# Patient Record
Sex: Female | Born: 1945
Health system: Southern US, Community
[De-identification: ages and names within clinical notes are randomized; demographics above are authoritative.]

## PROBLEM LIST (undated history)

## (undated) DIAGNOSIS — F329 Major depressive disorder, single episode, unspecified: Secondary | ICD-10-CM

## (undated) DIAGNOSIS — K5790 Diverticulosis of intestine, part unspecified, without perforation or abscess without bleeding: Secondary | ICD-10-CM

## (undated) DIAGNOSIS — I219 Acute myocardial infarction, unspecified: Secondary | ICD-10-CM

## (undated) DIAGNOSIS — M1990 Unspecified osteoarthritis, unspecified site: Secondary | ICD-10-CM

## (undated) DIAGNOSIS — M8580 Other specified disorders of bone density and structure, unspecified site: Secondary | ICD-10-CM

## (undated) DIAGNOSIS — C50919 Malignant neoplasm of unspecified site of unspecified female breast: Secondary | ICD-10-CM

## (undated) DIAGNOSIS — M4800 Spinal stenosis, site unspecified: Secondary | ICD-10-CM

## (undated) DIAGNOSIS — R32 Unspecified urinary incontinence: Secondary | ICD-10-CM

## (undated) DIAGNOSIS — E669 Obesity, unspecified: Secondary | ICD-10-CM

## (undated) DIAGNOSIS — I5189 Other ill-defined heart diseases: Secondary | ICD-10-CM

## (undated) DIAGNOSIS — S2232XA Fracture of one rib, left side, initial encounter for closed fracture: Secondary | ICD-10-CM

## (undated) DIAGNOSIS — I10 Essential (primary) hypertension: Secondary | ICD-10-CM

## (undated) DIAGNOSIS — Z923 Personal history of irradiation: Secondary | ICD-10-CM

## (undated) DIAGNOSIS — N183 Chronic kidney disease, stage 3 unspecified: Secondary | ICD-10-CM

## (undated) DIAGNOSIS — D509 Iron deficiency anemia, unspecified: Secondary | ICD-10-CM

## (undated) DIAGNOSIS — K219 Gastro-esophageal reflux disease without esophagitis: Secondary | ICD-10-CM

## (undated) DIAGNOSIS — F32A Depression, unspecified: Secondary | ICD-10-CM

## (undated) DIAGNOSIS — I2510 Atherosclerotic heart disease of native coronary artery without angina pectoris: Secondary | ICD-10-CM

## (undated) DIAGNOSIS — R011 Cardiac murmur, unspecified: Secondary | ICD-10-CM

## (undated) DIAGNOSIS — E785 Hyperlipidemia, unspecified: Secondary | ICD-10-CM

## (undated) DIAGNOSIS — M5416 Radiculopathy, lumbar region: Secondary | ICD-10-CM

## (undated) DIAGNOSIS — K635 Polyp of colon: Secondary | ICD-10-CM

## (undated) DIAGNOSIS — R079 Chest pain, unspecified: Secondary | ICD-10-CM

## (undated) DIAGNOSIS — K7689 Other specified diseases of liver: Secondary | ICD-10-CM

## (undated) DIAGNOSIS — Z9221 Personal history of antineoplastic chemotherapy: Secondary | ICD-10-CM

## (undated) HISTORY — DX: Major depressive disorder, single episode, unspecified: F32.9

## (undated) HISTORY — DX: Iron deficiency anemia, unspecified: D50.9

## (undated) HISTORY — DX: Malignant neoplasm of unspecified site of unspecified female breast: C50.919

## (undated) HISTORY — DX: Other ill-defined heart diseases: I51.89

## (undated) HISTORY — DX: Hyperlipidemia, unspecified: E78.5

## (undated) HISTORY — DX: Obesity, unspecified: E66.9

## (undated) HISTORY — DX: Chest pain, unspecified: R07.9

## (undated) HISTORY — DX: Polyp of colon: K63.5

## (undated) HISTORY — PX: CHOLECYSTECTOMY: SHX55

## (undated) HISTORY — DX: Unspecified osteoarthritis, unspecified site: M19.90

## (undated) HISTORY — DX: Other specified diseases of liver: K76.89

## (undated) HISTORY — DX: Acute myocardial infarction, unspecified: I21.9

## (undated) HISTORY — DX: Diverticulosis of intestine, part unspecified, without perforation or abscess without bleeding: K57.90

## (undated) HISTORY — DX: Essential (primary) hypertension: I10

## (undated) HISTORY — DX: Spinal stenosis, site unspecified: M48.00

## (undated) HISTORY — DX: Fracture of one rib, left side, initial encounter for closed fracture: S22.32XA

## (undated) HISTORY — DX: Radiculopathy, lumbar region: M54.16

## (undated) HISTORY — DX: Chronic kidney disease, stage 3 unspecified: N18.30

## (undated) HISTORY — DX: Cardiac murmur, unspecified: R01.1

## (undated) HISTORY — PX: APPENDECTOMY: SHX54

## (undated) HISTORY — PX: TONSILLECTOMY AND ADENOIDECTOMY: SUR1326

## (undated) HISTORY — DX: Depression, unspecified: F32.A

## (undated) HISTORY — DX: Unspecified urinary incontinence: R32

## (undated) HISTORY — DX: Other specified disorders of bone density and structure, unspecified site: M85.80

---

## 1978-06-02 HISTORY — PX: ABDOMINAL ADHESION SURGERY: SHX90

## 1986-06-02 HISTORY — PX: TUBAL LIGATION: SHX77

## 1992-06-02 HISTORY — PX: OTHER SURGICAL HISTORY: SHX169

## 1997-09-15 ENCOUNTER — Ambulatory Visit (HOSPITAL_COMMUNITY): Admission: RE | Admit: 1997-09-15 | Discharge: 1997-09-16 | Payer: Self-pay | Admitting: Surgery

## 1997-10-11 ENCOUNTER — Other Ambulatory Visit: Admission: RE | Admit: 1997-10-11 | Discharge: 1997-10-11 | Payer: Self-pay | Admitting: Gynecology

## 1998-12-25 ENCOUNTER — Other Ambulatory Visit: Admission: RE | Admit: 1998-12-25 | Discharge: 1998-12-25 | Payer: Self-pay | Admitting: Gynecology

## 1999-11-11 ENCOUNTER — Encounter: Admission: RE | Admit: 1999-11-11 | Discharge: 1999-11-11 | Payer: Self-pay | Admitting: Gynecology

## 1999-11-11 ENCOUNTER — Encounter: Payer: Self-pay | Admitting: Gynecology

## 1999-11-14 ENCOUNTER — Encounter: Payer: Self-pay | Admitting: *Deleted

## 1999-11-14 ENCOUNTER — Encounter: Admission: RE | Admit: 1999-11-14 | Discharge: 1999-11-14 | Payer: Self-pay | Admitting: *Deleted

## 2000-04-17 ENCOUNTER — Encounter: Payer: Self-pay | Admitting: *Deleted

## 2000-04-17 ENCOUNTER — Encounter: Admission: RE | Admit: 2000-04-17 | Discharge: 2000-04-17 | Payer: Self-pay | Admitting: *Deleted

## 2000-11-23 ENCOUNTER — Encounter: Admission: RE | Admit: 2000-11-23 | Discharge: 2000-11-23 | Payer: Self-pay | Admitting: *Deleted

## 2000-11-23 ENCOUNTER — Encounter: Payer: Self-pay | Admitting: *Deleted

## 2001-05-28 ENCOUNTER — Encounter: Payer: Self-pay | Admitting: *Deleted

## 2001-05-28 ENCOUNTER — Encounter: Admission: RE | Admit: 2001-05-28 | Discharge: 2001-05-28 | Payer: Self-pay | Admitting: *Deleted

## 2001-12-13 ENCOUNTER — Encounter: Payer: Self-pay | Admitting: *Deleted

## 2001-12-13 ENCOUNTER — Encounter: Admission: RE | Admit: 2001-12-13 | Discharge: 2001-12-13 | Payer: Self-pay | Admitting: *Deleted

## 2002-11-28 ENCOUNTER — Encounter: Admission: RE | Admit: 2002-11-28 | Discharge: 2002-11-28 | Payer: Self-pay

## 2003-01-31 ENCOUNTER — Encounter: Admission: RE | Admit: 2003-01-31 | Discharge: 2003-01-31 | Payer: Self-pay

## 2004-04-17 ENCOUNTER — Other Ambulatory Visit: Admission: RE | Admit: 2004-04-17 | Discharge: 2004-04-17 | Payer: Self-pay | Admitting: Internal Medicine

## 2004-05-28 ENCOUNTER — Encounter: Admission: RE | Admit: 2004-05-28 | Discharge: 2004-05-28 | Payer: Self-pay | Admitting: Internal Medicine

## 2004-06-13 ENCOUNTER — Encounter: Admission: RE | Admit: 2004-06-13 | Discharge: 2004-06-13 | Payer: Self-pay | Admitting: Internal Medicine

## 2005-07-09 ENCOUNTER — Encounter: Admission: RE | Admit: 2005-07-09 | Discharge: 2005-07-09 | Payer: Self-pay | Admitting: Internal Medicine

## 2006-04-21 ENCOUNTER — Other Ambulatory Visit: Admission: RE | Admit: 2006-04-21 | Discharge: 2006-04-21 | Payer: Self-pay | Admitting: Internal Medicine

## 2006-07-13 ENCOUNTER — Encounter: Admission: RE | Admit: 2006-07-13 | Discharge: 2006-07-13 | Payer: Self-pay | Admitting: Internal Medicine

## 2007-07-19 ENCOUNTER — Encounter: Admission: RE | Admit: 2007-07-19 | Discharge: 2007-07-19 | Payer: Self-pay | Admitting: Internal Medicine

## 2007-10-07 ENCOUNTER — Encounter: Admission: RE | Admit: 2007-10-07 | Discharge: 2007-10-07 | Payer: Self-pay | Admitting: Internal Medicine

## 2008-04-25 ENCOUNTER — Other Ambulatory Visit: Admission: RE | Admit: 2008-04-25 | Discharge: 2008-04-25 | Payer: Self-pay | Admitting: Gastroenterology

## 2008-09-11 ENCOUNTER — Encounter: Admission: RE | Admit: 2008-09-11 | Discharge: 2008-09-11 | Payer: Self-pay | Admitting: Gynecology

## 2009-09-14 ENCOUNTER — Encounter: Admission: RE | Admit: 2009-09-14 | Discharge: 2009-09-14 | Payer: Self-pay | Admitting: Internal Medicine

## 2009-09-20 ENCOUNTER — Encounter: Admission: RE | Admit: 2009-09-20 | Discharge: 2009-09-20 | Payer: Self-pay | Admitting: Internal Medicine

## 2010-01-25 ENCOUNTER — Encounter: Admission: RE | Admit: 2010-01-25 | Discharge: 2010-01-25 | Payer: Self-pay | Admitting: Internal Medicine

## 2010-03-04 ENCOUNTER — Encounter: Admission: RE | Admit: 2010-03-04 | Discharge: 2010-03-04 | Payer: Self-pay | Admitting: Internal Medicine

## 2010-06-22 ENCOUNTER — Encounter: Payer: Self-pay | Admitting: Internal Medicine

## 2010-09-05 ENCOUNTER — Other Ambulatory Visit: Payer: Self-pay | Admitting: Obstetrics and Gynecology

## 2010-09-05 ENCOUNTER — Other Ambulatory Visit (HOSPITAL_COMMUNITY)
Admission: RE | Admit: 2010-09-05 | Discharge: 2010-09-05 | Disposition: A | Discharge status: Home / Self Care | Payer: PRIVATE HEALTH INSURANCE | Source: Ambulatory Visit | Attending: Obstetrics and Gynecology | Admitting: Obstetrics and Gynecology

## 2010-09-05 DIAGNOSIS — Z124 Encounter for screening for malignant neoplasm of cervix: Secondary | ICD-10-CM | POA: Insufficient documentation

## 2010-09-24 ENCOUNTER — Encounter (HOSPITAL_COMMUNITY)
Admission: RE | Admit: 2010-09-24 | Discharge: 2010-09-24 | Disposition: A | Discharge status: Home / Self Care | Payer: PRIVATE HEALTH INSURANCE | Source: Ambulatory Visit | Attending: Obstetrics and Gynecology | Admitting: Obstetrics and Gynecology

## 2010-09-24 LAB — CBC
HCT: 39.1 % (ref 36.0–46.0)
Hemoglobin: 12.7 g/dL (ref 12.0–15.0)
MCV: 90.1 fL (ref 78.0–100.0)
RBC: 4.34 MIL/uL (ref 3.87–5.11)
RDW: 13.4 % (ref 11.5–15.5)
WBC: 5.5 10*3/uL (ref 4.0–10.5)

## 2010-09-24 LAB — BASIC METABOLIC PANEL
BUN: 25 mg/dL — ABNORMAL HIGH (ref 6–23)
CO2: 26 mEq/L (ref 19–32)
Chloride: 105 mEq/L (ref 96–112)
GFR calc non Af Amer: 49 mL/min — ABNORMAL LOW (ref >60)
Glucose, Bld: 94 mg/dL (ref 70–99)
Potassium: 4 mEq/L (ref 3.5–5.1)
Sodium: 139 mEq/L (ref 135–145)

## 2010-10-01 ENCOUNTER — Ambulatory Visit (HOSPITAL_COMMUNITY)
Admission: RE | Admit: 2010-10-01 | Discharge: 2010-10-01 | Disposition: A | Discharge status: Home / Self Care | Payer: PRIVATE HEALTH INSURANCE | Source: Ambulatory Visit | Attending: Obstetrics and Gynecology | Admitting: Obstetrics and Gynecology

## 2010-10-01 ENCOUNTER — Other Ambulatory Visit: Payer: Self-pay | Admitting: Internal Medicine

## 2010-10-01 DIAGNOSIS — N393 Stress incontinence (female) (male): Secondary | ICD-10-CM | POA: Insufficient documentation

## 2010-10-01 DIAGNOSIS — Z01812 Encounter for preprocedural laboratory examination: Secondary | ICD-10-CM | POA: Insufficient documentation

## 2010-10-01 DIAGNOSIS — Z1231 Encounter for screening mammogram for malignant neoplasm of breast: Secondary | ICD-10-CM

## 2010-10-01 DIAGNOSIS — Z01818 Encounter for other preprocedural examination: Secondary | ICD-10-CM | POA: Insufficient documentation

## 2010-10-01 HISTORY — PX: BLADDER SUSPENSION: SHX72

## 2010-10-29 NOTE — Op Note (Signed)
NAME:  [name], [name]               ACCOUNT NO.:  [id number]  MEDICAL RECORD NO.:  [id number]           PATIENT TYPE:  O  LOCATION:  [facility]                          [facility]  PHYSICIAN:  Patsy Baltimore, MD     DATE OF BIRTH:  1945-10-06  DATE OF PROCEDURE:  10/01/2010 DATE OF DISCHARGE:  10/01/2010                              OPERATIVE REPORT   PREOPERATIVE DIAGNOSIS:  Stress incontinence.  POSTOPERATIVE DIAGNOSIS:  Stress incontinence.  PROCEDURE PERFORMED:  Monarc midurethral sling placement, cystoscopy.  SURGEON:  Patsy Baltimore, MD  ANESTHESIA:  General.  ESTIMATED BLOOD LOSS:  Minimal.  The patient was brought to the operating room with IV fluids running. She had been diagnosed with stress urinary incontinence.  Informed consent had been obtained for the Monarc midurethral sling placement. She declined the alternatives and wished to proceed.  While in the operating room, she was put under general anesthesia, given antibiotics and had TEDs and SCDs for prophylaxis.  Her legs were lifted up to the dorsal lithotomy position. She was prepped and draped in the usual sterile fashion.  A time-out was called and we began the procedure.  A weighted speculum was inserted into the vagina.  A Foley catheter was inserted into the bladder.  An Allis clamp was placed approximately 1 to 1.5 cm below the urethral meatus and a midline incision was made sharply in the vaginal epithelium extending about 2-3 cm cephalad.  A solution of 20 units of vasopressin and 40 mL of saline was injected through the course of the vaginal epithelium extending towards the pubic rami.  The Metzenbaum scissors was then used to use for dissection to create bilateral submucosal tunnel beneath the vaginal epithelium on either side towards the pubic rami.  These tunnels were extended up to and behind the pubic rami.  On the outside, 2 skin incisions were made at the level of the clitoris in the thigh crease  at a point 4-6 cm lateral to the clitoris.  This corresponded to the insertion of the adductor longus muscle.  Starting on the left side, the TOT needle was grasped and the tip was placed in one of the thigh incisions.  The tip was then directed cephalad until the obturator membrane was perforated and a popping sensation was felt.  A finger was then placed in the ipsilateral vaginal tunnel and positioned up to and behind the pubic ramus.  Using the curve of the needle, the needle was directed to the tip of my finger and passed into the vagina under direct guidance.  The mesh was then attached to the end of the needle and the needle was withdrawn through the thigh incision.  The same was repeated on the right side making sure that the midline of the mesh was maintained.  A hemostat was placed and opened between the urethra and the mesh to act as a spacer and create distance between the mesh and the urethra.  This was done to avoid excessive elevation of the urethra and lower the risk for retention.  Plastic coverings of the mesh on both sides were then removed through  the thigh incision while holding appropriate tension in the midline.  The mesh was then trimmed at the thigh incision.  The vaginal incision was closed in simple interrupted fashion using 2-0 Vicryl.  The thigh incisions was closed with Dermabond.  At this point cystoscopy was done to inspect the inside of the urethra and bladder. After thorough inspection, there was no evidence of mesh or foreign body or bleeding in the bladder. The cystoscope was then removed.  The Foley was replaced.  We had excellent hemostasis in the vagina.  All the instruments were then removed.  The sponge, lap, needle counts were correct x2.  The patient tolerated the procedure well.  She was transferred to the PACU in stable condition.          ______________________________ Patsy Baltimore, MD     CO/MEDQ  D:  10/10/2010  T:  10/10/2010   Job:  [id number]  Electronically Signed by Patsy Baltimore MD on 10/29/2010 04:41:29 PM

## 2010-11-04 ENCOUNTER — Ambulatory Visit
Admission: RE | Admit: 2010-11-04 | Discharge: 2010-11-04 | Disposition: A | Discharge status: Home / Self Care | Payer: PRIVATE HEALTH INSURANCE | Source: Ambulatory Visit | Attending: Internal Medicine | Admitting: Internal Medicine

## 2010-11-04 DIAGNOSIS — Z1231 Encounter for screening mammogram for malignant neoplasm of breast: Secondary | ICD-10-CM

## 2011-05-08 ENCOUNTER — Encounter: Payer: Self-pay | Admitting: Internal Medicine

## 2011-05-12 ENCOUNTER — Encounter: Payer: Self-pay | Admitting: Internal Medicine

## 2011-06-03 DIAGNOSIS — C50919 Malignant neoplasm of unspecified site of unspecified female breast: Secondary | ICD-10-CM

## 2011-06-03 HISTORY — DX: Malignant neoplasm of unspecified site of unspecified female breast: C50.919

## 2011-06-10 ENCOUNTER — Ambulatory Visit (AMBULATORY_SURGERY_CENTER): Payer: BC Managed Care – PPO | Admitting: *Deleted

## 2011-06-10 ENCOUNTER — Encounter: Payer: Self-pay | Admitting: Internal Medicine

## 2011-06-10 VITALS — Ht 62.0 in | Wt 189.0 lb

## 2011-06-10 DIAGNOSIS — Z1211 Encounter for screening for malignant neoplasm of colon: Secondary | ICD-10-CM

## 2011-06-10 MED ORDER — PEG-KCL-NACL-NASULF-NA ASC-C 100 G PO SOLR: ORAL | Status: DC

## 2011-06-24 ENCOUNTER — Ambulatory Visit (AMBULATORY_SURGERY_CENTER): Payer: BC Managed Care – PPO | Admitting: Internal Medicine

## 2011-06-24 ENCOUNTER — Encounter: Payer: Self-pay | Admitting: Internal Medicine

## 2011-06-24 DIAGNOSIS — D129 Benign neoplasm of anus and anal canal: Secondary | ICD-10-CM

## 2011-06-24 DIAGNOSIS — D126 Benign neoplasm of colon, unspecified: Secondary | ICD-10-CM

## 2011-06-24 DIAGNOSIS — D128 Benign neoplasm of rectum: Secondary | ICD-10-CM

## 2011-06-24 DIAGNOSIS — Z800 Family history of malignant neoplasm of digestive organs: Secondary | ICD-10-CM

## 2011-06-24 DIAGNOSIS — Z1211 Encounter for screening for malignant neoplasm of colon: Secondary | ICD-10-CM

## 2011-06-24 MED ORDER — SODIUM CHLORIDE 0.9 % IV SOLN: 500.0000 mL | INTRAVENOUS | Status: DC

## 2011-06-24 NOTE — Patient Instructions (Signed)
Metamucil 1 tsp by mouth daily

## 2011-06-24 NOTE — Op Note (Signed)
[facility] [address] [place], [place]  [address]  COLONOSCOPY PROCEDURE REPORT  PATIENT:  Kelli, Thomas  MR#:  [id number] BIRTHDATE:  [birth year], 65 yrs. old  GENDER:  female ENDOSCOPIST:  Hedwig Morton. Juanda Chance, MD REF. BY:  [name], M.D. PROCEDURE DATE:  06/24/2011 PROCEDURE:  Colonoscopy with biopsy ASA CLASS:  Class II INDICATIONS:  family history of colon cancer mother with colon cancer, prior colon 1996,2000, MEDICATIONS:   These medications were titrated to patient response per physician's verbal order, Versed 10 mg, Fentanyl 75 mcg  DESCRIPTION OF PROCEDURE:   After the risks and benefits and of the procedure were explained, informed consent was obtained. Digital rectal exam was performed and revealed no rectal masses. The LB CF-[id number] endoscope was introduced through the anus and advanced to the cecum, which was identified by both the appendix and ileocecal valve.  The quality of the prep was good, using MoviPrep.  The instrument was then slowly withdrawn as the colon was fully examined. <<PROCEDUREIMAGES>>  FINDINGS:  A diminutive polyp was found in the rectum. 3 mm flat polyp The polyp was removed using cold biopsy forceps (see image9 and image10).  Moderate diverticulosis was found in the sigmoid colon (see image2, image1, and image7).  This was otherwise a normal examination of the colon (see image8, image6, image5, image4, and image3).   Retroflexed views in the rectum revealed no abnormalities.    The scope was then withdrawn from the patient and the procedure completed.  COMPLICATIONS:  None ENDOSCOPIC IMPRESSION: 1) Diminutive polyp in the rectum 2) Moderate diverticulosis in the sigmoid colon 3) Otherwise normal examination RECOMMENDATIONS: 1) Await pathology results 2) High fiber diet. Metamucil 1 tsp po qd  REPEAT EXAM:  In 5 year(s) for.  ______________________________ Hedwig Morton. Juanda Chance, MD  CC:  n. eSIGNED:   Hedwig Morton.  [name] at 06/24/2011 09:22 AM  Lina Sayre, [phone]

## 2011-06-24 NOTE — Progress Notes (Signed)
Patient did not experience any of the following events: a burn prior to discharge; a fall within the facility; wrong site/side/patient/procedure/implant event; or a hospital transfer or hospital admission upon discharge from the facility. (G8907) Patient did not have preoperative order for IV antibiotic SSI prophylaxis. (G8918)  

## 2011-06-25 ENCOUNTER — Telehealth: Payer: Self-pay | Admitting: *Deleted

## 2011-06-25 NOTE — Telephone Encounter (Signed)

## 2011-06-27 NOTE — Progress Notes (Signed)
Addended by: Maple Hudson on: 06/27/2011 02:47 PM   Modules accepted: Level of Service

## 2011-07-01 ENCOUNTER — Encounter: Payer: Self-pay | Admitting: Internal Medicine

## 2011-07-02 ENCOUNTER — Encounter: Payer: Self-pay | Admitting: *Deleted

## 2011-09-29 ENCOUNTER — Other Ambulatory Visit: Payer: Self-pay | Admitting: Internal Medicine

## 2011-09-29 DIAGNOSIS — Z1231 Encounter for screening mammogram for malignant neoplasm of breast: Secondary | ICD-10-CM

## 2011-11-05 ENCOUNTER — Ambulatory Visit
Admission: RE | Admit: 2011-11-05 | Discharge: 2011-11-05 | Disposition: A | Discharge status: Home / Self Care | Payer: BC Managed Care – PPO | Source: Ambulatory Visit | Attending: Internal Medicine | Admitting: Internal Medicine

## 2011-11-05 DIAGNOSIS — Z1231 Encounter for screening mammogram for malignant neoplasm of breast: Secondary | ICD-10-CM

## 2012-04-27 ENCOUNTER — Other Ambulatory Visit: Payer: Self-pay | Admitting: Internal Medicine

## 2012-04-27 DIAGNOSIS — N6322 Unspecified lump in the left breast, upper inner quadrant: Secondary | ICD-10-CM

## 2012-05-05 ENCOUNTER — Ambulatory Visit
Admission: RE | Admit: 2012-05-05 | Discharge: 2012-05-05 | Disposition: A | Discharge status: Home / Self Care | Payer: BC Managed Care – PPO | Source: Ambulatory Visit | Attending: Internal Medicine | Admitting: Internal Medicine

## 2012-05-05 ENCOUNTER — Other Ambulatory Visit: Payer: Self-pay | Admitting: Internal Medicine

## 2012-05-05 DIAGNOSIS — N6322 Unspecified lump in the left breast, upper inner quadrant: Secondary | ICD-10-CM

## 2012-05-14 ENCOUNTER — Other Ambulatory Visit: Payer: Self-pay | Admitting: Internal Medicine

## 2012-05-14 ENCOUNTER — Ambulatory Visit
Admission: RE | Admit: 2012-05-14 | Discharge: 2012-05-14 | Disposition: A | Discharge status: Home / Self Care | Payer: BC Managed Care – PPO | Source: Ambulatory Visit | Attending: Internal Medicine | Admitting: Internal Medicine

## 2012-05-14 DIAGNOSIS — N6322 Unspecified lump in the left breast, upper inner quadrant: Secondary | ICD-10-CM

## 2012-05-17 ENCOUNTER — Ambulatory Visit
Admission: RE | Admit: 2012-05-17 | Discharge: 2012-05-17 | Disposition: A | Discharge status: Home / Self Care | Payer: BC Managed Care – PPO | Source: Ambulatory Visit | Attending: Internal Medicine | Admitting: Internal Medicine

## 2012-05-17 ENCOUNTER — Other Ambulatory Visit: Payer: Self-pay | Admitting: Internal Medicine

## 2012-05-17 DIAGNOSIS — N6489 Other specified disorders of breast: Secondary | ICD-10-CM

## 2012-05-17 DIAGNOSIS — C50912 Malignant neoplasm of unspecified site of left female breast: Secondary | ICD-10-CM

## 2012-05-17 DIAGNOSIS — N6322 Unspecified lump in the left breast, upper inner quadrant: Secondary | ICD-10-CM

## 2012-05-18 ENCOUNTER — Encounter (INDEPENDENT_AMBULATORY_CARE_PROVIDER_SITE_OTHER): Payer: Self-pay | Admitting: General Surgery

## 2012-05-18 ENCOUNTER — Ambulatory Visit (INDEPENDENT_AMBULATORY_CARE_PROVIDER_SITE_OTHER): Payer: BC Managed Care – PPO | Admitting: General Surgery

## 2012-05-18 VITALS — BP 122/76 | HR 68 | Temp 98.2°F | Resp 14 | Ht 62.0 in | Wt 159.8 lb

## 2012-05-18 DIAGNOSIS — C50119 Malignant neoplasm of central portion of unspecified female breast: Secondary | ICD-10-CM

## 2012-05-19 NOTE — Progress Notes (Signed)
Patient ID: [name], female   DOB: 07/02/1945, 66 y.o.   MRN: [id number]  Chief Complaint  Patient presents with  . Breast Cancer    new pt- eval lt breast cancer    HPI [name] is a 66 y.o. female.  Referred by Dr Hu/Dr. Griffin HPI 66 yof who recently felt a left breast mass near the areola.  She had this evaluated by mmg/us and this is about a 1 cm mass.  She has an mr pending as of now.  A biopsy was performed that showed DCIS/IDC and her prognostic profile is still pending.  She reports no other changes or complaints with either breast.  She has a family history of breast cancer in her sister in her 50s.  She does not know if her sister was evaluated with genetic testing.  She comes in today to discuss her options for care.  Past Medical History  Diagnosis Date  . Arthritis   . Hyperlipidemia   . Hypertension   . Depression   . Heart murmur     Past Surgical History  Procedure Date  . Appendectomy   . Cholecystectomy   . Tubal ligation 1988  . Bladder suspension 10/2010  . Tonsillectomy and adenoidectomy   . Abdominal adhesion surgery 1980    Family History  Problem Relation Age of Onset  . Colon cancer Mother   . Cancer Mother     colon  . Cancer Sister     breast    Social History History  Substance Use Topics  . Smoking status: Never Smoker   . Smokeless tobacco: Never Used  . Alcohol Use: 0.6 oz/week    1 Glasses of wine per week    No Known Allergies  Current Outpatient Prescriptions  Medication Sig Dispense Refill  . Ascorbic Acid (VITAMIN C) 1000 MG tablet Take 1,000 mg by mouth daily.        . aspirin 325 MG tablet Take 325 mg by mouth daily.        . atenolol (TENORMIN) 50 MG tablet Take 1 tablet by mouth Daily.      . Cholecalciferol (VITAMIN D) 2000 UNITS CAPS Take 1 capsule by mouth daily.        . olmesartan-hydrochlorothiazide (BENICAR HCT) 40-12.5 MG per tablet Take 1 tablet by mouth daily.      . omeprazole (PRILOSEC) 40 MG  capsule Take 1 tablet by mouth Daily.      . simvastatin (ZOCOR) 80 MG tablet Take 40 mg by mouth Daily.      . vitamin E 400 UNIT capsule Take 400 Units by mouth daily.          Review of Systems Review of Systems  Blood pressure 122/76, pulse 68, temperature 98.2 F (36.8 C), temperature source Temporal, resp. rate 14, height 5' 2" (1.575 m), weight 159 lb 12.8 oz (72.485 kg).  Physical Exam Physical Exam  Vitals reviewed. Constitutional: She appears well-developed and well-nourished.  Cardiovascular: Normal rate, regular rhythm and normal heart sounds.   Pulmonary/Chest: Effort normal and breath sounds normal. She has no wheezes. She has no rales. Right breast exhibits no inverted nipple, no mass, no nipple discharge, no skin change and no tenderness. Breasts are symmetrical.    Abdominal: Soft. There is no hepatomegaly.  Lymphadenopathy:    She has no cervical adenopathy.    She has no axillary adenopathy.       Right: No supraclavicular adenopathy present.         Left: No supraclavicular adenopathy present.    Data Reviewed DIGITAL DIAGNOSTIC LEFT MAMMOGRAM WITH CAD AND LEFT BREAST  ULTRASOUND:  Comparison: 11/05/2011  Findings:  Breast Density: ACR Category 3: The breast tissue is  heterogeneously dense.  There is architectural distortion in the 11 o'clock position of the  left breast. Spot compression views reveal a new 5 mm irregular  mass.  Mammographic images were processed with CAD.  On physical exam, there is a 7 mm superficial palpable mass in the  periareolar 11 o'clock position of the left breast.  Ultrasound is performed, showing That the palpable mass represents  an irregular hypoechoic mass that measures 9 x 11 x 7 mm. It is  taller than it is wide and demonstrates a surrounding echogenic rim  and strong posterior acoustic shadowing. Ultrasound of the left  axilla is negative.  IMPRESSION:  Suspicious periareolar mass in the 11 o'clock position of the  left  breast.  BI-RADS CATEGORY 5: Highly suggestive of malignancy - appropriate  action should be taken.   Assessment    Clinical stage I left breast cancer    Plan    Genetic counselling/testing MRI and prognostic panel Possible left breast wire guided lumpectomy/snbx if amenable to that   We discussed the staging and pathophysiology of breast cancer. We discussed all of the different options for treatment for breast cancer including surgery, chemotherapy, radiation therapy, Herceptin, and antiestrogen therapy.   We discussed a sentinel lymph node biopsy as she does not appear to having lymph node involvement right now. We discussed the performance of that with injection of radioactive tracer and blue dye. We discussed that she would have an incision underneath her axillary hairline. We discussed that there is a bout a 10-20% chance of having a positive node with a sentinel lymph node biopsy and we will await the permanent pathology to make any other first further decisions in terms of her treatment. One of these options might be to return to the operating room to perform an axillary lymph node dissection. We discussed about a 1-2% risk lifetime of chronic shoulder pain as well as lymphedema associated with a sentinel lymph node biopsy.  We discussed the options for treatment of the breast cancer which included lumpectomy versus a mastectomy. We discussed the performance of the lumpectomy with a wire placement. We discussed a 10-20% chance of a positive margin requiring reexcision in the operating room. We also discussed that she may need radiation therapy or antiestrogen therapy or both if she undergoes lumpectomy. We discussed the mastectomy and the postoperative care for that as well. We discussed that there is no difference in her survival whether she undergoes lumpectomy with radiation therapy or antiestrogen therapy versus a mastectomy. There is a slight difference in the local recurrence  rate being 3-5% with lumpectomy and about 1% with a mastectomy. We discussed the risks of operation including bleeding, infection, possible reoperation. She understands her further therapy will be based on what her stages at the time of her operation.         Tauren Delbuono 05/19/2012, 10:50 AM    

## 2012-05-20 ENCOUNTER — Telehealth: Payer: Self-pay | Admitting: *Deleted

## 2012-05-20 ENCOUNTER — Ambulatory Visit
Admission: RE | Admit: 2012-05-20 | Discharge: 2012-05-20 | Disposition: A | Discharge status: Home / Self Care | Payer: BC Managed Care – PPO | Source: Ambulatory Visit | Attending: Internal Medicine | Admitting: Internal Medicine

## 2012-05-20 DIAGNOSIS — C50912 Malignant neoplasm of unspecified site of left female breast: Secondary | ICD-10-CM

## 2012-05-20 MED ORDER — GADOBENATE DIMEGLUMINE 529 MG/ML IV SOLN
14.0000 mL | Freq: Once | INTRAVENOUS | Status: AC | PRN
  Administered 2012-05-20: 14 mL via INTRAVENOUS

## 2012-05-20 NOTE — Telephone Encounter (Signed)
Left message for pt to return my call so I can schedule a genetic appt.  

## 2012-05-24 ENCOUNTER — Telehealth: Payer: Self-pay | Admitting: *Deleted

## 2012-05-24 ENCOUNTER — Ambulatory Visit (INDEPENDENT_AMBULATORY_CARE_PROVIDER_SITE_OTHER): Payer: BC Managed Care – PPO | Admitting: General Surgery

## 2012-05-24 ENCOUNTER — Encounter (INDEPENDENT_AMBULATORY_CARE_PROVIDER_SITE_OTHER): Payer: Self-pay | Admitting: General Surgery

## 2012-05-24 ENCOUNTER — Other Ambulatory Visit (INDEPENDENT_AMBULATORY_CARE_PROVIDER_SITE_OTHER): Payer: Self-pay | Admitting: General Surgery

## 2012-05-24 VITALS — BP 130/74 | HR 76 | Temp 97.8°F | Resp 18 | Ht 61.75 in | Wt 159.5 lb

## 2012-05-24 DIAGNOSIS — C50419 Malignant neoplasm of upper-outer quadrant of unspecified female breast: Secondary | ICD-10-CM | POA: Insufficient documentation

## 2012-05-24 NOTE — Addendum Note (Signed)
Addended byEmelia Loron on: 05/24/2012 01:06 PM   Modules accepted: Orders

## 2012-05-24 NOTE — Telephone Encounter (Signed)
Received call from [facility] at [facility] with pt there to schedule genetic appt.  Got w/ Clydie Braun and she gave me dates and time.  Annabelle Harman is to give pt appt info for 05/31/12 at 2:45pm.  Will give pt my name & number for any questions.

## 2012-05-24 NOTE — Progress Notes (Signed)
Subjective:     Patient ID: [name], female   DOB: 12-14-1945, 66 y.o.   MRN: [id number]  HPI This is a 66 year old female I saw last week with a newly diagnosed left breast cancer. We discussed genetic counseling which she is still due to get. I do not have her MRI or her prognostic panel back at the last visit. She reports no changes since our last visit. She returns today to discuss her MRI as well as her prognostic panel and to begin scheduling her surgery.  Review of Systems BILATERAL BREAST MRI WITH AND WITHOUT CONTRAST  Technique: Multiplanar, multisequence MR images of both breasts  were obtained prior to and following the intravenous administration  of 14ml of Multihance. Three dimensional images were evaluated at  the independent DynaCad workstation.  Comparison: prior mammograms and ultrasound  Findings: No lymphadenopathy is identified. There is minimal  cortical lobulation in a left axillary lymph node which is  unchanged on prior dissimilar studies dating back at least 2008.  Minimal post biopsy changes noted in the left breast 11 o'clock  location. A few central bilateral T2 hyperintense cysts are  incidentally noted. On postcontrast images, there is a moderate  background parenchymal enhancement pattern.  In the left breast 11 o'clock location periareolar area, there is  an irregular enhancing mass with internal clip artifact measuring  1.6 x 1.3 x 0.9 cm. This corresponds to the biopsy-proven breast  cancer. This demonstrates predominately plateau type enhancement  kinetics. There are numerous confluent foci of enhancement in both  central breasts which do not meet quantitative threshold for  significant contrast enhancement and measure less than 5 mm. This  may decrease the sensitivity for detection of malignancy. No other  area of dominant abnormal enhancement is seen in either breast.  IMPRESSION:  Solitary abnormal enhancing mass left breast 11 o'clock  location,  corresponding to the biopsy-proven breast cancer. Allowing for the  presence of mild to moderate bilateral central breast confluent  foci of enhancement, which may decrease the sensitivity for  detection of malignancy, no other dominant area of enhancement is  seen to suggest multifocal, multicentric, or contralateral  malignancy.     Objective:   Physical Exam deferred    Assessment:     Clinical stage I left breast cancer    Plan:     #1 we will plan on getting her genetic counseling and testing scheduled as soon as possible. I discussed that we could proceed with surgery but we will decide we will await to get his results. Hopefully these will occur very quickly. #2 we discussed her MRI which shows a solitary T1 tumor without any evidence of adenopathy or any other breast lesions present. I do think that we could proceed with a lumpectomy and sentinel lymph node biopsy as we discussed last week and she is still agreeable to that. We did discuss the risks associated with surgery. We discussed the need for radiation therapy postoperatively as well as the possibility of further surgery for positive margins. We discussed that this would be an outpatient surgery as well. #3 we discussed her prognostic panel which is 100% estrogen receptor and progesterone receptor positive. Her proliferation index is 55%. HER-2/neu is amplified at 2.45. There is a fair amount of heterogeneity in this specimen as well as there are 40 tumor cells that are counted. I told her that she certainly would receive antiestrogen therapy and there is certainly achieved she was received Herceptin-based therapy possibly  with chemotherapy. I think he would be best to wait and do her final excision to see what her final pathology results are. I will end up referred her both medical and radiation oncology as well. We discussed this plan with she and her daughter for about 30 minutes today.

## 2012-05-31 ENCOUNTER — Other Ambulatory Visit: Payer: BC Managed Care – PPO | Admitting: Lab

## 2012-05-31 ENCOUNTER — Encounter: Payer: Self-pay | Admitting: Genetic Counselor

## 2012-05-31 ENCOUNTER — Ambulatory Visit (HOSPITAL_BASED_OUTPATIENT_CLINIC_OR_DEPARTMENT_OTHER): Payer: BC Managed Care – PPO | Admitting: Genetic Counselor

## 2012-05-31 DIAGNOSIS — C50419 Malignant neoplasm of upper-outer quadrant of unspecified female breast: Secondary | ICD-10-CM

## 2012-05-31 DIAGNOSIS — Z853 Personal history of malignant neoplasm of breast: Secondary | ICD-10-CM

## 2012-05-31 DIAGNOSIS — IMO0002 Reserved for concepts with insufficient information to code with codable children: Secondary | ICD-10-CM

## 2012-05-31 DIAGNOSIS — Z809 Family history of malignant neoplasm, unspecified: Secondary | ICD-10-CM

## 2012-05-31 NOTE — Progress Notes (Signed)
Dr.  Emelia Loron requested a consultation for genetic counseling and risk assessment for [name], a 66 y.o. female, for discussion of her personal history of breast cancer and family history of breast, prostate and colon cancer. She presents to clinic today to discuss the possibility of a genetic predisposition to cancer, and to further clarify her risks, as well as her family members' risks for cancer.   HISTORY OF PRESENT ILLNESS: In 2013, at the age of 83, [name] was diagnosed with invasive ductal carcinoma of the bresat.    Past Medical History  Diagnosis Date  . Arthritis   . Hyperlipidemia   . Hypertension   . Depression   . Heart murmur   . Breast cancer 2013    Past Surgical History  Procedure Date  . Appendectomy   . Cholecystectomy   . Tubal ligation 1988  . Bladder suspension 10/2010  . Tonsillectomy and adenoidectomy   . Abdominal adhesion surgery 1980    History  Substance Use Topics  . Smoking status: Never Smoker   . Smokeless tobacco: Never Used  . Alcohol Use: 0.6 oz/week    1 Glasses of wine per week    REPRODUCTIVE HISTORY AND PERSONAL RISK ASSESSMENT FACTORS: Menarche was at age 41.   Menopause at 51. Uterus Intact: Yes Ovaries Intact: Yes G2P2A0 , first live birth at age 40  She has not previously undergone treatment for infertility.   OCP use for 5 yeras   She has used HRT in the past.    FAMILY HISTORY:  We obtained a detailed, 4-generation family history.  Significant diagnoses are listed below: Family History  Problem Relation Age of Onset  . Colon cancer Mother 63  . Breast cancer Sister 68  . Prostate cancer Father 32  . Stomach cancer Maternal Grandmother 20  The patient was diagnosed with breast cancer at age 64.  Her sister wad diagnosed at age 92.  The patient's mother was diagnsoed with colon cancer at 11 and is 73.  She had 8 brothers and sisters who are cancer free to the patient's knowledge.  The  patient's maternal grandmother had stomach cancer at age 31.  The patinet's father had prostate cancer at age 52 and is currently 23.  He had two brothers who are deceased.  His parents died when he was 1 from unknown causes.  Therefore little is known about the paternal side of the family.  Patient's maternal ancestors are of [place] descent, and paternal ancestors are of [place] descent. There is no reported Ashkenazi Jewish ancestry. There is no  known consanguinity.  GENETIC COUNSELING RISK ASSESSMENT, DISCUSSION, AND SUGGESTED FOLLOW UP: We reviewed the natural history and genetic etiology of sporadic, familial and hereditary cancer syndromes.  About 5-10% of breast cancer is hereditary.  Of this, about 85% is the result of a BRCA1 or BRCA2 mutation.  We reviewed the red flags of hereditary cancer syndromes and the dominant inheritance patterns. Based on the patients diagnsois and that of her sisters, as well as a limited family history on her father's side of the family, we will consider BRCA testing.  The patient's personal history of breast cancer and family history of breast, prostate and colon cancer is suggestive of the following possible diagnosis: hereditary cancer syndrome  We discussed that identification of a hereditary cancer syndrome may help her care providers tailor the patients medical management. If a mutation indicating a hereditary cancer syndrome is detected in this case,  the [employer] recommendations would include increased cancer surveillance and possible prophylactic surgery. If a mutation is detected, the patient will be referred back to the referring provider and to any additional appropriate care providers to discuss the relevant options.   If a mutation is not found in the patient, this will decrease the likelihood of a hereditary cancer syndrome as the explanation for her breast cancer. Cancer surveillance options would be discussed for the  patient according to the appropriate standard National Comprehensive Cancer Network and American Cancer Society guidelines, with consideration of their personal and family history risk factors. In this case, the patient will be referred back to their care providers for discussions of management.   After considering the risks, benefits, and limitations, the patient provided informed consent for  the following  testing: [employer] MyRisk through [employer].   Per the patient's request, we will contact her by telephone to discuss these results. A follow up genetic counseling visit will be scheduled if indicated.  The patient was seen for a total of 60 minutes, greater than 50% of which was spent face-to-face counseling.  This plan is being carried out per Dr. Jannet Askew recommendations.  This note will also be sent to the referring provider via the electronic medical record. The patient will be supplied with a summary of this genetic counseling discussion as well as educational information on the discussed hereditary cancer syndromes following the conclusion of their visit.   Patient was discussed with Dr. Drue Second.   _______________________________________________________________________ For Office Staff:  Number of people involved in session: 2 Was an Intern/ student involved with case: no

## 2012-06-07 ENCOUNTER — Encounter (HOSPITAL_BASED_OUTPATIENT_CLINIC_OR_DEPARTMENT_OTHER): Payer: Self-pay | Admitting: *Deleted

## 2012-06-07 NOTE — Progress Notes (Signed)
To come in for labs and ekg 

## 2012-06-08 ENCOUNTER — Other Ambulatory Visit: Payer: Self-pay

## 2012-06-08 ENCOUNTER — Encounter (HOSPITAL_BASED_OUTPATIENT_CLINIC_OR_DEPARTMENT_OTHER)
Admission: RE | Admit: 2012-06-08 | Discharge: 2012-06-08 | Disposition: A | Discharge status: Home / Self Care | Payer: BC Managed Care – PPO | Source: Ambulatory Visit | Attending: General Surgery | Admitting: General Surgery

## 2012-06-08 LAB — COMPREHENSIVE METABOLIC PANEL
Albumin: 4 g/dL (ref 3.5–5.2)
Alkaline Phosphatase: 65 U/L (ref 39–117)
BUN: 25 mg/dL — ABNORMAL HIGH (ref 6–23)
Chloride: 101 mEq/L (ref 96–112)
Creatinine, Ser: 1.03 mg/dL (ref 0.50–1.10)
GFR calc Af Amer: 63 mL/min — ABNORMAL LOW (ref >90)
GFR calc non Af Amer: 55 mL/min — ABNORMAL LOW (ref >90)
Glucose, Bld: 87 mg/dL (ref 70–99)
Total Bilirubin: 0.5 mg/dL (ref 0.3–1.2)

## 2012-06-08 LAB — CBC WITH DIFFERENTIAL/PLATELET
Basophils Relative: 1 % (ref 0–1)
HCT: 37.6 % (ref 36.0–46.0)
Hemoglobin: 12.4 g/dL (ref 12.0–15.0)
Lymphs Abs: 2.1 10*3/uL (ref 0.7–4.0)
MCH: 30.1 pg (ref 26.0–34.0)
MCHC: 33 g/dL (ref 30.0–36.0)
Monocytes Absolute: 0.5 10*3/uL (ref 0.1–1.0)
Monocytes Relative: 9 % (ref 3–12)
Neutro Abs: 2.9 10*3/uL (ref 1.7–7.7)
RBC: 4.12 MIL/uL (ref 3.87–5.11)

## 2012-06-08 LAB — CANCER ANTIGEN 27.29: CA 27.29: 19 U/mL (ref 0–39)

## 2012-06-10 ENCOUNTER — Encounter (HOSPITAL_BASED_OUTPATIENT_CLINIC_OR_DEPARTMENT_OTHER): Payer: Self-pay

## 2012-06-10 ENCOUNTER — Other Ambulatory Visit (INDEPENDENT_AMBULATORY_CARE_PROVIDER_SITE_OTHER): Payer: Self-pay | Admitting: General Surgery

## 2012-06-10 ENCOUNTER — Ambulatory Visit (HOSPITAL_BASED_OUTPATIENT_CLINIC_OR_DEPARTMENT_OTHER): Payer: BC Managed Care – PPO | Admitting: Anesthesiology

## 2012-06-10 ENCOUNTER — Encounter (HOSPITAL_COMMUNITY)
Admission: RE | Admit: 2012-06-10 | Discharge: 2012-06-10 | Disposition: A | Discharge status: Home / Self Care | Payer: BC Managed Care – PPO | Source: Ambulatory Visit | Attending: General Surgery | Admitting: General Surgery

## 2012-06-10 ENCOUNTER — Encounter (HOSPITAL_BASED_OUTPATIENT_CLINIC_OR_DEPARTMENT_OTHER): Payer: Self-pay | Admitting: Anesthesiology

## 2012-06-10 ENCOUNTER — Encounter (HOSPITAL_BASED_OUTPATIENT_CLINIC_OR_DEPARTMENT_OTHER)
Admission: RE | Disposition: A | Discharge status: Home / Self Care | Payer: Self-pay | Source: Ambulatory Visit | Attending: General Surgery

## 2012-06-10 ENCOUNTER — Telehealth: Payer: Self-pay | Admitting: *Deleted

## 2012-06-10 ENCOUNTER — Ambulatory Visit (HOSPITAL_BASED_OUTPATIENT_CLINIC_OR_DEPARTMENT_OTHER)
Admission: RE | Admit: 2012-06-10 | Discharge: 2012-06-10 | Disposition: A | Discharge status: Home / Self Care | Payer: BC Managed Care – PPO | Source: Ambulatory Visit | Attending: General Surgery | Admitting: General Surgery

## 2012-06-10 ENCOUNTER — Ambulatory Visit
Admission: RE | Admit: 2012-06-10 | Discharge: 2012-06-10 | Disposition: A | Discharge status: Home / Self Care | Payer: BC Managed Care – PPO | Source: Ambulatory Visit | Attending: General Surgery | Admitting: General Surgery

## 2012-06-10 DIAGNOSIS — C50419 Malignant neoplasm of upper-outer quadrant of unspecified female breast: Secondary | ICD-10-CM

## 2012-06-10 DIAGNOSIS — D0590 Unspecified type of carcinoma in situ of unspecified breast: Secondary | ICD-10-CM

## 2012-06-10 DIAGNOSIS — C773 Secondary and unspecified malignant neoplasm of axilla and upper limb lymph nodes: Secondary | ICD-10-CM | POA: Insufficient documentation

## 2012-06-10 DIAGNOSIS — C50919 Malignant neoplasm of unspecified site of unspecified female breast: Secondary | ICD-10-CM | POA: Insufficient documentation

## 2012-06-10 DIAGNOSIS — E785 Hyperlipidemia, unspecified: Secondary | ICD-10-CM | POA: Insufficient documentation

## 2012-06-10 DIAGNOSIS — Z79899 Other long term (current) drug therapy: Secondary | ICD-10-CM | POA: Insufficient documentation

## 2012-06-10 DIAGNOSIS — Z7982 Long term (current) use of aspirin: Secondary | ICD-10-CM | POA: Insufficient documentation

## 2012-06-10 DIAGNOSIS — E669 Obesity, unspecified: Secondary | ICD-10-CM | POA: Insufficient documentation

## 2012-06-10 DIAGNOSIS — I10 Essential (primary) hypertension: Secondary | ICD-10-CM | POA: Insufficient documentation

## 2012-06-10 DIAGNOSIS — Z01812 Encounter for preprocedural laboratory examination: Secondary | ICD-10-CM | POA: Insufficient documentation

## 2012-06-10 HISTORY — PX: BREAST LUMPECTOMY: SHX2

## 2012-06-10 HISTORY — PX: BREAST LUMPECTOMY WITH NEEDLE LOCALIZATION AND AXILLARY SENTINEL LYMPH NODE BX: SHX5760

## 2012-06-10 SURGERY — BREAST LUMPECTOMY WITH NEEDLE LOCALIZATION AND AXILLARY SENTINEL LYMPH NODE BX
Anesthesia: General | Site: Breast | Laterality: Left | Wound class: Clean

## 2012-06-10 MED ORDER — SODIUM CHLORIDE 0.9 % IJ SOLN
INTRAMUSCULAR | Status: DC | PRN
  Administered 2012-06-10: 13:00:00 via INTRAMUSCULAR

## 2012-06-10 MED ORDER — PROPOFOL 10 MG/ML IV BOLUS
INTRAVENOUS | Status: DC | PRN
  Administered 2012-06-10: 100 mg via INTRAVENOUS

## 2012-06-10 MED ORDER — EPHEDRINE SULFATE 50 MG/ML IJ SOLN
INTRAMUSCULAR | Status: DC | PRN
  Administered 2012-06-10 (×2): 10 mg via INTRAVENOUS

## 2012-06-10 MED ORDER — BUPIVACAINE HCL (PF) 0.5 % IJ SOLN
INTRAMUSCULAR | Status: DC | PRN
  Administered 2012-06-10: 17 mL

## 2012-06-10 MED ORDER — MIDAZOLAM HCL 2 MG/2ML IJ SOLN: 0.5000 mg | Freq: Once | INTRAMUSCULAR | Status: DC | PRN

## 2012-06-10 MED ORDER — OXYCODONE HCL 5 MG PO TABS
5.0000 mg | ORAL_TABLET | Freq: Once | ORAL | Status: AC | PRN
  Administered 2012-06-10: 5 mg via ORAL

## 2012-06-10 MED ORDER — PROMETHAZINE HCL 25 MG/ML IJ SOLN: 6.2500 mg | INTRAMUSCULAR | Status: DC | PRN

## 2012-06-10 MED ORDER — LACTATED RINGERS IV SOLN
INTRAVENOUS | Status: DC
  Administered 2012-06-10: 20 mL/h via INTRAVENOUS
  Administered 2012-06-10 (×2): via INTRAVENOUS

## 2012-06-10 MED ORDER — ONDANSETRON HCL 4 MG/2ML IJ SOLN
INTRAMUSCULAR | Status: DC | PRN
  Administered 2012-06-10: 4 mg via INTRAVENOUS

## 2012-06-10 MED ORDER — TECHNETIUM TC 99M SULFUR COLLOID FILTERED
1.0000 | Freq: Once | INTRAVENOUS | Status: AC | PRN
  Administered 2012-06-10: 1 via INTRADERMAL

## 2012-06-10 MED ORDER — CEFAZOLIN SODIUM-DEXTROSE 2-3 GM-% IV SOLR
2.0000 g | INTRAVENOUS | Status: AC
  Administered 2012-06-10: 2 g via INTRAVENOUS

## 2012-06-10 MED ORDER — HYDROMORPHONE HCL PF 1 MG/ML IJ SOLN
0.2500 mg | INTRAMUSCULAR | Status: DC | PRN
  Administered 2012-06-10 (×4): 0.5 mg via INTRAVENOUS

## 2012-06-10 MED ORDER — FENTANYL CITRATE 0.05 MG/ML IJ SOLN
50.0000 ug | INTRAMUSCULAR | Status: DC | PRN
  Administered 2012-06-10: 50 ug via INTRAVENOUS

## 2012-06-10 MED ORDER — MIDAZOLAM HCL 2 MG/2ML IJ SOLN
1.0000 mg | INTRAMUSCULAR | Status: DC | PRN
  Administered 2012-06-10: 1 mg via INTRAVENOUS

## 2012-06-10 MED ORDER — ACETAMINOPHEN 10 MG/ML IV SOLN
1000.0000 mg | Freq: Once | INTRAVENOUS | Status: AC
  Administered 2012-06-10: 1000 mg via INTRAVENOUS

## 2012-06-10 MED ORDER — MIDAZOLAM HCL 5 MG/5ML IJ SOLN
INTRAMUSCULAR | Status: DC | PRN
  Administered 2012-06-10: 2 mg via INTRAVENOUS

## 2012-06-10 MED ORDER — DEXAMETHASONE SODIUM PHOSPHATE 4 MG/ML IJ SOLN
INTRAMUSCULAR | Status: DC | PRN
  Administered 2012-06-10: 10 mg via INTRAVENOUS

## 2012-06-10 MED ORDER — OXYCODONE HCL 5 MG/5ML PO SOLN: 5.0000 mg | Freq: Once | ORAL | Status: AC | PRN

## 2012-06-10 MED ORDER — FENTANYL CITRATE 0.05 MG/ML IJ SOLN
INTRAMUSCULAR | Status: DC | PRN
  Administered 2012-06-10: 100 ug via INTRAVENOUS

## 2012-06-10 MED ORDER — MEPERIDINE HCL 25 MG/ML IJ SOLN: 6.2500 mg | INTRAMUSCULAR | Status: DC | PRN

## 2012-06-10 MED ORDER — LIDOCAINE HCL (CARDIAC) 20 MG/ML IV SOLN
INTRAVENOUS | Status: DC | PRN
  Administered 2012-06-10: 50 mg via INTRAVENOUS

## 2012-06-10 SURGICAL SUPPLY — 62 items
APPLIER CLIP 9.375 MED OPEN (MISCELLANEOUS) ×2
BENZOIN TINCTURE PRP APPL 2/3 (GAUZE/BANDAGES/DRESSINGS) ×2 IMPLANT
BINDER BREAST LRG (GAUZE/BANDAGES/DRESSINGS) IMPLANT
BINDER BREAST MEDIUM (GAUZE/BANDAGES/DRESSINGS) IMPLANT
BINDER BREAST XLRG (GAUZE/BANDAGES/DRESSINGS) IMPLANT
BINDER BREAST XXLRG (GAUZE/BANDAGES/DRESSINGS) IMPLANT
BLADE SURG 15 STRL LF DISP TIS (BLADE) ×1 IMPLANT
BLADE SURG 15 STRL SS (BLADE) ×1
BNDG COHESIVE 4X5 TAN STRL (GAUZE/BANDAGES/DRESSINGS) IMPLANT
CANISTER SUCTION 1200CC (MISCELLANEOUS) ×2 IMPLANT
CHLORAPREP W/TINT 26ML (MISCELLANEOUS) ×2 IMPLANT
CLIP APPLIE 9.375 MED OPEN (MISCELLANEOUS) ×1 IMPLANT
CLOTH BEACON ORANGE TIMEOUT ST (SAFETY) ×2 IMPLANT
COVER MAYO STAND STRL (DRAPES) ×2 IMPLANT
COVER PROBE W GEL 5X96 (DRAPES) ×2 IMPLANT
COVER TABLE BACK 60X90 (DRAPES) ×2 IMPLANT
DECANTER SPIKE VIAL GLASS SM (MISCELLANEOUS) IMPLANT
DERMABOND ADVANCED (GAUZE/BANDAGES/DRESSINGS)
DERMABOND ADVANCED .7 DNX12 (GAUZE/BANDAGES/DRESSINGS) IMPLANT
DEVICE DUBIN W/COMP PLATE 8390 (MISCELLANEOUS) IMPLANT
DRAIN CHANNEL 19F RND (DRAIN) IMPLANT
DRAPE LAPAROSCOPIC ABDOMINAL (DRAPES) IMPLANT
DRAPE U-SHAPE 76X120 STRL (DRAPES) IMPLANT
DRSG TEGADERM 4X4.75 (GAUZE/BANDAGES/DRESSINGS) ×4 IMPLANT
ELECT COATED BLADE 2.86 ST (ELECTRODE) ×2 IMPLANT
ELECT REM PT RETURN 9FT ADLT (ELECTROSURGICAL) ×2
ELECTRODE REM PT RTRN 9FT ADLT (ELECTROSURGICAL) ×1 IMPLANT
EVACUATOR SILICONE 100CC (DRAIN) IMPLANT
GAUZE SPONGE 4X4 12PLY STRL LF (GAUZE/BANDAGES/DRESSINGS) IMPLANT
GLOVE BIO SURGEON STRL SZ7 (GLOVE) ×2 IMPLANT
GLOVE BIOGEL PI IND STRL 7.5 (GLOVE) ×1 IMPLANT
GLOVE BIOGEL PI INDICATOR 7.5 (GLOVE) ×1
GOWN PREVENTION PLUS XLARGE (GOWN DISPOSABLE) ×2 IMPLANT
KIT MARKER MARGIN INK (KITS) ×2 IMPLANT
NDL SAFETY ECLIPSE 18X1.5 (NEEDLE) ×1 IMPLANT
NEEDLE HYPO 18GX1.5 SHARP (NEEDLE) ×1
NEEDLE HYPO 25X1 1.5 SAFETY (NEEDLE) ×4 IMPLANT
NS IRRIG 1000ML POUR BTL (IV SOLUTION) IMPLANT
PACK BASIN DAY SURGERY FS (CUSTOM PROCEDURE TRAY) ×2 IMPLANT
PENCIL BUTTON HOLSTER BLD 10FT (ELECTRODE) ×2 IMPLANT
PIN SAFETY STERILE (MISCELLANEOUS) IMPLANT
SLEEVE SCD COMPRESS KNEE MED (MISCELLANEOUS) ×2 IMPLANT
SPONGE LAP 18X18 X RAY DECT (DISPOSABLE) IMPLANT
SPONGE LAP 4X18 X RAY DECT (DISPOSABLE) ×2 IMPLANT
STAPLER VISISTAT 35W (STAPLE) ×2 IMPLANT
STOCKINETTE IMPERVIOUS LG (DRAPES) IMPLANT
STRIP CLOSURE SKIN 1/2X4 (GAUZE/BANDAGES/DRESSINGS) ×2 IMPLANT
SUT MNCRL AB 4-0 PS2 18 (SUTURE) ×2 IMPLANT
SUT MON AB 5-0 PS2 18 (SUTURE) IMPLANT
SUT SILK 2 0 SH (SUTURE) IMPLANT
SUT VIC AB 2-0 SH 27 (SUTURE) ×1
SUT VIC AB 2-0 SH 27XBRD (SUTURE) ×1 IMPLANT
SUT VIC AB 3-0 SH 27 (SUTURE) ×1
SUT VIC AB 3-0 SH 27X BRD (SUTURE) ×1 IMPLANT
SUT VIC AB 5-0 PS2 18 (SUTURE) IMPLANT
SUT VICRYL AB 3 0 TIES (SUTURE) IMPLANT
SYR CONTROL 10ML LL (SYRINGE) ×4 IMPLANT
TOWEL OR 17X24 6PK STRL BLUE (TOWEL DISPOSABLE) ×2 IMPLANT
TOWEL OR NON WOVEN STRL DISP B (DISPOSABLE) ×2 IMPLANT
TUBE CONNECTING 20X1/4 (TUBING) ×2 IMPLANT
WATER STERILE IRR 1000ML POUR (IV SOLUTION) ×2 IMPLANT
YANKAUER SUCT BULB TIP NO VENT (SUCTIONS) ×2 IMPLANT

## 2012-06-10 NOTE — Anesthesia Postprocedure Evaluation (Signed)
  Anesthesia Post-op Note  Patient: [name]  Procedure(s) Performed: Procedure(s) (LRB) with comments: BREAST LUMPECTOMY WITH NEEDLE LOCALIZATION AND AXILLARY SENTINEL LYMPH NODE BX (Left)  Patient Location: PACU  Anesthesia Type:General  Level of Consciousness: awake, alert , oriented and patient cooperative  Airway and Oxygen Therapy: Patient Spontanous Breathing  Post-op Pain: none  Post-op Assessment: Post-op Vital signs reviewed, Patient's Cardiovascular Status Stable, Respiratory Function Stable, Patent Airway, No signs of Nausea or vomiting and Pain level controlled  Post-op Vital Signs: Reviewed and stable  Complications: No apparent anesthesia complications

## 2012-06-10 NOTE — Interval H&P Note (Signed)
History and Physical Interval Note:  06/10/2012 12:44 PM  [name]  has presented today for surgery, with the diagnosis of left breast cancer  The various methods of treatment have been discussed with the patient and family. After consideration of risks, benefits and other options for treatment, the patient has consented to  Procedure(s) (LRB) with comments: BREAST LUMPECTOMY WITH NEEDLE LOCALIZATION AND AXILLARY SENTINEL LYMPH NODE BX (Left) as a surgical intervention .  The patient's history has been reviewed, patient examined, no change in status, stable for surgery.  I have reviewed the patient's chart and labs.  Questions were answered to the patient's satisfaction.     Ondre Salvetti

## 2012-06-10 NOTE — Anesthesia Preprocedure Evaluation (Signed)
Anesthesia Evaluation  Patient identified by MRN, date of birth, ID band Patient awake    Reviewed: Allergy & Precautions, H&P , NPO status , Patient's Chart, lab work & pertinent test results  History of Anesthesia Complications Negative for: history of anesthetic complications  Airway Mallampati: II TM Distance: >3 FB Neck ROM: Full    Dental  (+) Caps and Dental Advisory Given   Pulmonary neg pulmonary ROS,  breath sounds clear to auscultation  Pulmonary exam normal       Cardiovascular hypertension, Pt. on medications and Pt. on home beta blockers - Valvular Problems/MurmursRhythm:Regular Rate:Normal     Neuro/Psych negative neurological ROS     GI/Hepatic Neg liver ROS, GERD-  Medicated and Controlled,  Endo/Other  negative endocrine ROS  Renal/GU negative Renal ROS     Musculoskeletal   Abdominal (+) + obese,   Peds  Hematology   Anesthesia Other Findings   Reproductive/Obstetrics                           Anesthesia Physical Anesthesia Plan  ASA: II  Anesthesia Plan: General   Post-op Pain Management:    Induction: Intravenous  Airway Management Planned: LMA  Additional Equipment:   Intra-op Plan:   Post-operative Plan:   Informed Consent: I have reviewed the patients History and Physical, chart, labs and discussed the procedure including the risks, benefits and alternatives for the proposed anesthesia with the patient or authorized representative who has indicated his/her understanding and acceptance.   Dental advisory given  Plan Discussed with: CRNA and Surgeon  Anesthesia Plan Comments: (Plan routine monitors, GA- LMA OK)        Anesthesia Quick Evaluation

## 2012-06-10 NOTE — Telephone Encounter (Signed)
Left message for pt to return my call so I can schedule an appt. 

## 2012-06-10 NOTE — Progress Notes (Signed)
Post nuc med injection. Tol well.  VSS

## 2012-06-10 NOTE — Op Note (Signed)
Preoperative diagnosis: Clinical stage I left breast cancer Postoperative diagnosis: Same as above Procedure: #1 left breast wire-guided lumpectomy #2 left axillary sentinel lymph node biopsy #3 injection of blue dye for sentinel node identification Surgeon: Dr. Harden Mo Anesthesia: Gen. Estimated blood loss: Minimal Specimens: #1 left breast tissue marked with paint #2 left axillary sentinel lymph node Complications: None Drains: None Sponge and needle count correct x2 at end of operation Disposition to recovery in stable condition  Indications: This is a 67 year old female who presented after noting a left breast mass. She underwent evaluation and this appears to be a less than 2 cm breast mass and no other abnormality seen on MRI. This underwent biopsy showing a heterogeneously faintly positive HER-2 invasive ductal carcinoma. She and I discussed all of her different options and decided proceed with breast conservation therapy. We decided to wait on a port until final pathology is back. We discussed the risks and benefits prior to beginning.  Procedure: After informed consent was obtained the patient was first taken to the breast center. She had a wire placed. I had these mammograms available to me in the operating room. She was then administered cefazolin. Sequential compression devices were on the legs. She was administered technetium in a standard periareolar fashion. She was then placed under general anesthesia without complication. Her left breast and axilla were then prepped and draped in the standard sterile surgical fashion. Surgical timeout was then performed  There was some weak radioactivity in hrt axilla with the neoprobe. I did inject her with methylene blue dye saline mixture and all 4 areas around her nipple and massaged for 2 minutes. I then approached the breast first. The lesion was near the nipple areolar complex. I made an incision that included a portion of skin  overlying this. I then removed the entire tumor as well as the surrounding tissue. I brought the wire in from remotely. I did not take this all the way down to her pectoralis muscle as it was fairly shallow. Mammogram was taken confirming removal of the wire, clipped, and mass in the middle the specimen. It appeared that I had grossly clear margins. I marked this with paint. This was confirmed by radiology. I then placed 2 clips deep. I placed one clip in each position around the cavity. I then closed the cavity with 2-0 Vicryl. Closed the dermis with 3-0 Vicryl the skin with 4-0 Monocryl.  I identified the location of the sentinel node. I made a 2 cm incision below the axillary hairline. I used cautery to go thruogh the axillary fascia. I then used the neoprobe to identify what appeared to be one or possibly 2 sentinel nodes adherent to each other. I removed these and passed off the table. There was no background radioactivity. Hemostasis was observed. I closed the x-ray fascia with 2-0 Vicryl. The dermis was closed with 3-0 Vicryl the skin with 4-0 Monocryl.  I infiltrated quarter percent Marcaine throughout both areas. I then placed Steri-Strips and a sterile dressing. A breast binder was placed. She tolerated this well was extubated and transferred to the recovery room in stable condition.

## 2012-06-10 NOTE — Anesthesia Procedure Notes (Addendum)
Performed by: Caren Macadam   Procedure Name: LMA Insertion Date/Time: 06/10/2012 1:13 PM Performed by: Caren Macadam Pre-anesthesia Checklist: Patient identified, Emergency Drugs available, Suction available and Patient being monitored Patient Re-evaluated:Patient Re-evaluated prior to inductionOxygen Delivery Method: Circle System Utilized Preoxygenation: Pre-oxygenation with 100% oxygen Intubation Type: IV induction Ventilation: Mask ventilation without difficulty LMA: LMA inserted LMA Size: 4.0 Number of attempts: 1 Airway Equipment and Method: bite block Placement Confirmation: positive ETCO2 and breath sounds checked- equal and bilateral Tube secured with: Tape Dental Injury: Teeth and Oropharynx as per pre-operative assessment

## 2012-06-10 NOTE — Transfer of Care (Signed)
Immediate Anesthesia Transfer of Care Note  Patient: [name]  Procedure(s) Performed: Procedure(s) (LRB) with comments: BREAST LUMPECTOMY WITH NEEDLE LOCALIZATION AND AXILLARY SENTINEL LYMPH NODE BX (Left)  Patient Location: PACU  Anesthesia Type:General  Level of Consciousness: awake and alert   Airway & Oxygen Therapy: Patient Spontanous Breathing and Patient connected to face mask oxygen  Post-op Assessment: Report given to PACU RN and Post -op Vital signs reviewed and stable  Post vital signs: Reviewed and stable  Complications: No apparent anesthesia complications

## 2012-06-10 NOTE — H&P (View-Only) (Signed)
Patient ID: [name], female   DOB: January 23, 1946, 67 y.o.   MRN: [id number]  Chief Complaint  Patient presents with  . Breast Cancer    new pt- eval lt breast cancer    HPI [name] is a 67 y.o. female.  Referred by Dr Hu/Dr. Valentina Lucks HPI 10 yof who recently felt a left breast mass near the areola.  She had this evaluated by mmg/us and this is about a 1 cm mass.  She has an mr pending as of now.  A biopsy was performed that showed DCIS/IDC and her prognostic profile is still pending.  She reports no other changes or complaints with either breast.  She has a family history of breast cancer in her sister in her 2s.  She does not know if her sister was evaluated with genetic testing.  She comes in today to discuss her options for care.  Past Medical History  Diagnosis Date  . Arthritis   . Hyperlipidemia   . Hypertension   . Depression   . Heart murmur     Past Surgical History  Procedure Date  . Appendectomy   . Cholecystectomy   . Tubal ligation 1988  . Bladder suspension 10/2010  . Tonsillectomy and adenoidectomy   . Abdominal adhesion surgery 1980    Family History  Problem Relation Age of Onset  . Colon cancer Mother   . Cancer Mother     colon  . Cancer Sister     breast    Social History History  Substance Use Topics  . Smoking status: Never Smoker   . Smokeless tobacco: Never Used  . Alcohol Use: 0.6 oz/week    1 Glasses of wine per week    No Known Allergies  Current Outpatient Prescriptions  Medication Sig Dispense Refill  . Ascorbic Acid (VITAMIN C) 1000 MG tablet Take 1,000 mg by mouth daily.        Marland Kitchen aspirin 325 MG tablet Take 325 mg by mouth daily.        Marland Kitchen atenolol (TENORMIN) 50 MG tablet Take 1 tablet by mouth Daily.      . Cholecalciferol (VITAMIN D) 2000 UNITS CAPS Take 1 capsule by mouth daily.        Marland Kitchen olmesartan-hydrochlorothiazide (BENICAR HCT) 40-12.5 MG per tablet Take 1 tablet by mouth daily.      Marland Kitchen omeprazole (PRILOSEC) 40 MG  capsule Take 1 tablet by mouth Daily.      . simvastatin (ZOCOR) 80 MG tablet Take 40 mg by mouth Daily.      . vitamin E 400 UNIT capsule Take 400 Units by mouth daily.          Review of Systems Review of Systems  Blood pressure 122/76, pulse 68, temperature 98.2 F (36.8 C), temperature source Temporal, resp. rate 14, height 5\' 2"  (1.575 m), weight 159 lb 12.8 oz (72.485 kg).  Physical Exam Physical Exam  Vitals reviewed. Constitutional: She appears well-developed and well-nourished.  Cardiovascular: Normal rate, regular rhythm and normal heart sounds.   Pulmonary/Chest: Effort normal and breath sounds normal. She has no wheezes. She has no rales. Right breast exhibits no inverted nipple, no mass, no nipple discharge, no skin change and no tenderness. Breasts are symmetrical.    Abdominal: Soft. There is no hepatomegaly.  Lymphadenopathy:    She has no cervical adenopathy.    She has no axillary adenopathy.       Right: No supraclavicular adenopathy present.  Left: No supraclavicular adenopathy present.    Data Reviewed DIGITAL DIAGNOSTIC LEFT MAMMOGRAM WITH CAD AND LEFT BREAST  ULTRASOUND:  Comparison: 11/05/2011  Findings:  Breast Density: ACR Category 3: The breast tissue is  heterogeneously dense.  There is architectural distortion in the 11 o'clock position of the  left breast. Spot compression views reveal a new 5 mm irregular  mass.  Mammographic images were processed with CAD.  On physical exam, there is a 7 mm superficial palpable mass in the  periareolar 11 o'clock position of the left breast.  Ultrasound is performed, showing That the palpable mass represents  an irregular hypoechoic mass that measures 9 x 11 x 7 mm. It is  taller than it is wide and demonstrates a surrounding echogenic rim  and strong posterior acoustic shadowing. Ultrasound of the left  axilla is negative.  IMPRESSION:  Suspicious periareolar mass in the 11 o'clock position of the  left  breast.  BI-RADS CATEGORY 5: Highly suggestive of malignancy - appropriate  action should be taken.   Assessment    Clinical stage I left breast cancer    Plan    Genetic counselling/testing MRI and prognostic panel Possible left breast wire guided lumpectomy/snbx if amenable to that   We discussed the staging and pathophysiology of breast cancer. We discussed all of the different options for treatment for breast cancer including surgery, chemotherapy, radiation therapy, Herceptin, and antiestrogen therapy.   We discussed a sentinel lymph node biopsy as she does not appear to having lymph node involvement right now. We discussed the performance of that with injection of radioactive tracer and blue dye. We discussed that she would have an incision underneath her axillary hairline. We discussed that there is a bout a 10-20% chance of having a positive node with a sentinel lymph node biopsy and we will await the permanent pathology to make any other first further decisions in terms of her treatment. One of these options might be to return to the operating room to perform an axillary lymph node dissection. We discussed about a 1-2% risk lifetime of chronic shoulder pain as well as lymphedema associated with a sentinel lymph node biopsy.  We discussed the options for treatment of the breast cancer which included lumpectomy versus a mastectomy. We discussed the performance of the lumpectomy with a wire placement. We discussed a 10-20% chance of a positive margin requiring reexcision in the operating room. We also discussed that she may need radiation therapy or antiestrogen therapy or both if she undergoes lumpectomy. We discussed the mastectomy and the postoperative care for that as well. We discussed that there is no difference in her survival whether she undergoes lumpectomy with radiation therapy or antiestrogen therapy versus a mastectomy. There is a slight difference in the local recurrence  rate being 3-5% with lumpectomy and about 1% with a mastectomy. We discussed the risks of operation including bleeding, infection, possible reoperation. She understands her further therapy will be based on what her stages at the time of her operation.         [name] 05/19/2012, 10:50 AM

## 2012-06-11 ENCOUNTER — Encounter (HOSPITAL_BASED_OUTPATIENT_CLINIC_OR_DEPARTMENT_OTHER): Payer: Self-pay | Admitting: General Surgery

## 2012-06-11 ENCOUNTER — Telehealth: Payer: Self-pay | Admitting: *Deleted

## 2012-06-11 ENCOUNTER — Telehealth (INDEPENDENT_AMBULATORY_CARE_PROVIDER_SITE_OTHER): Payer: Self-pay

## 2012-06-11 ENCOUNTER — Encounter: Payer: Self-pay | Admitting: *Deleted

## 2012-06-11 ENCOUNTER — Encounter (INDEPENDENT_AMBULATORY_CARE_PROVIDER_SITE_OTHER): Payer: Self-pay

## 2012-06-11 NOTE — Telephone Encounter (Signed)
Pt returned my call & I confirmed 06/14/12 appt w/ pt.  Unable to mail before appt letter & packet to pt - gave verbal.  Emailed Elease Hashimoto & Dr. Dwain Sarna to make them aware.  Took paperwork to [facility] for chart.

## 2012-06-11 NOTE — Telephone Encounter (Signed)
Patient daughter calling into office to see if patient can return to work.  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  Patient only has 3 sick day's available.  Patient daughter advised she may return to work on 06/15/12 but, patient cannot lift, push or pull anything over 10 lbs.  Return to work note requested to be faxed to [facility] @ [phone].  Patient daughter reports patient incision appears to be doing well, no redness, swelling, fevers or drainage. Patient has post op appointment on 06/30/12 @ 3:30 pm w/Dr. Dwain Sarna. (s/p NL Breast Lumpectomy w/SLN axillary BX 06/10/12)

## 2012-06-11 NOTE — Progress Notes (Signed)
Emailed Clydie Braun for [employer].

## 2012-06-11 NOTE — Telephone Encounter (Signed)
LMOM for Dawn to call me back on this pt so we can work on getting the pt scheduled for medical and radiation appt's at [facility].

## 2012-06-12 ENCOUNTER — Other Ambulatory Visit: Payer: Self-pay | Admitting: *Deleted

## 2012-06-12 DIAGNOSIS — C50419 Malignant neoplasm of upper-outer quadrant of unspecified female breast: Secondary | ICD-10-CM

## 2012-06-14 ENCOUNTER — Ambulatory Visit (HOSPITAL_BASED_OUTPATIENT_CLINIC_OR_DEPARTMENT_OTHER): Payer: BC Managed Care – PPO | Admitting: Oncology

## 2012-06-14 ENCOUNTER — Ambulatory Visit: Payer: BC Managed Care – PPO

## 2012-06-14 ENCOUNTER — Encounter: Payer: Self-pay | Admitting: Oncology

## 2012-06-14 ENCOUNTER — Other Ambulatory Visit (HOSPITAL_BASED_OUTPATIENT_CLINIC_OR_DEPARTMENT_OTHER): Payer: BC Managed Care – PPO | Admitting: Lab

## 2012-06-14 VITALS — BP 106/46 | HR 66 | Temp 97.4°F | Resp 20 | Ht 61.0 in | Wt 160.7 lb

## 2012-06-14 DIAGNOSIS — C50219 Malignant neoplasm of upper-inner quadrant of unspecified female breast: Secondary | ICD-10-CM

## 2012-06-14 DIAGNOSIS — C50419 Malignant neoplasm of upper-outer quadrant of unspecified female breast: Secondary | ICD-10-CM

## 2012-06-14 DIAGNOSIS — Z170 Estrogen receptor positive status [ER+]: Secondary | ICD-10-CM

## 2012-06-14 LAB — COMPREHENSIVE METABOLIC PANEL (CC13)
ALT: 28 U/L (ref 0–55)
AST: 22 U/L (ref 5–34)
Albumin: 3.6 g/dL (ref 3.5–5.0)
Calcium: 10.1 mg/dL (ref 8.4–10.4)
Chloride: 103 mEq/L (ref 98–107)
Potassium: 3.9 mEq/L (ref 3.5–5.1)
Total Protein: 7.2 g/dL (ref 6.4–8.3)

## 2012-06-14 LAB — CBC WITH DIFFERENTIAL/PLATELET
BASO%: 0.5 % (ref 0.0–2.0)
Basophils Absolute: 0 10*3/uL (ref 0.0–0.1)
EOS%: 2.1 % (ref 0.0–7.0)
HGB: 12.7 g/dL (ref 11.6–15.9)
MCH: 30.6 pg (ref 25.1–34.0)
MCHC: 34.2 g/dL (ref 31.5–36.0)
RDW: 13.6 % (ref 11.2–14.5)
lymph#: 3 10*3/uL (ref 0.9–3.3)

## 2012-06-14 NOTE — Progress Notes (Signed)
[name] [id number] 1945-11-07 67 y.o. 06/14/2012 11:19 AM  CC  [name], MD [address] [place] [address] Dr. Emelia Loron Dr. Lonie Peak  REASON FOR CONSULTATION:  67 year old female with left breast mass found on self breast examination. Patient was seen in the Breast Clinic for discussion of her treatment options.  STAGE:   No matching staging information was found for the patient.  REFERRING PHYSICIAN: Dr. Emelia Loron  HISTORY OF PRESENT ILLNESS:  [name] is a 67 y.o. female.  Who noted a left breast mass on self throat examination. She underwent a mammogram in December that showed the mass. She went on to have it biopsied on 05/14/2012. The pathology revealed invasive ductal carcinoma that was ER +100% PR +100% HER-2/neu positive with a ratio of 2.45. Ki-67 was 55% and elevated tumor was grade 2. She had an MRI performed that showed the area to be measuring out at 1.6 cm. Because of this she was seen by Dr. Emelia Loron he discussed doing a lumpectomy with sentinel lymph node biopsy. Clinically patient seems to be doing well she is without any significant complaints or problems. Her case was discussed at the multidisciplinary breast conference. Recommendations made are based on  NCCN guidelines for stage I HER-2/neu positive breast cancer.   Past Medical History: Past Medical History  Diagnosis Date  . Arthritis   . Hyperlipidemia   . Hypertension   . Breast cancer 2013  . Depression   . Heart murmur     heard one when she was pregnamt-maybe had echo-cannot remember    Past Surgical History: Past Surgical History  Procedure Date  . Appendectomy   . Cholecystectomy   . Tubal ligation 1988  . Bladder suspension 10/2010  . Tonsillectomy and adenoidectomy   . Abdominal adhesion surgery 1980  . Breast surgery     lt br bx-negative  . Breast lumpectomy with needle localization and axillary sentinel lymph  node bx 06/10/2012    Procedure: BREAST LUMPECTOMY WITH NEEDLE LOCALIZATION AND AXILLARY SENTINEL LYMPH NODE BX;  Surgeon: Emelia Loron, MD;  Location: [facility];  Service: General;  Laterality: Left;    Family History: Family History  Problem Relation Age of Onset  . Colon cancer Mother 36  . Breast cancer Sister 58  . Prostate cancer Father 82  . Stomach cancer Maternal Grandmother 36    Social History History  Substance Use Topics  . Smoking status: Never Smoker   . Smokeless tobacco: Never Used  . Alcohol Use: 0.6 oz/week    1 Glasses of wine per week    Allergies: No Known Allergies  Current Medications: Current Outpatient Prescriptions  Medication Sig Dispense Refill  . Ascorbic Acid (VITAMIN C) 1000 MG tablet Take 1,000 mg by mouth daily.        Marland Kitchen aspirin 325 MG tablet Take 325 mg by mouth daily.        Marland Kitchen atenolol (TENORMIN) 50 MG tablet Take 1 tablet by mouth Daily.      . Cholecalciferol (VITAMIN D) 2000 UNITS CAPS Take 1 capsule by mouth daily.        Marland Kitchen olmesartan-hydrochlorothiazide (BENICAR HCT) 40-12.5 MG per tablet Take 1 tablet by mouth daily.      Marland Kitchen omeprazole (PRILOSEC) 40 MG capsule Take 1 tablet by mouth Daily.      . simvastatin (ZOCOR) 80 MG tablet Take 40 mg by mouth Daily.      . vitamin  E 400 UNIT capsule Take 400 Units by mouth daily.          OB/GYN History:menarche at 8. Menopause at 55, G2P2 first pregnancy at 57, no HRT  Fertility Discussion: N/A Prior History of Cancer: none prior hx of benign breast bx of left breast in 1994  Health Maintenance:  Colonoscopy yes Bone Density yes Last PAP smear yes  ECOG PERFORMANCE STATUS: 0 - Asymptomatic  Genetic Counseling/testing: not recommended at this time  REVIEW OF SYSTEMS:  A comprehensive review of systems was negative.  PHYSICAL EXAMINATION: Blood pressure 106/46, pulse 66, temperature 97.4 F (36.3 C), resp. rate 20, height 5\' 1"  (1.549 m), weight 160 lb 11.2 oz  (72.893 kg).  ZOX:WRUEA, healthy, no distress, well nourished and well developed SKIN: skin color, texture, turgor are normal HEAD: Normocephalic EYES: PERRLA, EOMI, Conjunctiva are pink and non-injected EARS: External ears normal OROPHARYNX:no exudate, no erythema and lips, buccal mucosa, and tongue normal  NECK: supple, no adenopathy LYMPH:  no palpable lymphadenopathy, no hepatosplenomegaly BREAST:left breast reveals a small palpable mass no lymphadenopathy is noted right breast no masses or nipple discharge. LUNGS: clear to auscultation and percussion HEART: regular rate & rhythm ABDOMEN:abdomen soft, non-tender, obese, normal bowel sounds and no masses or organomegaly BACK: Back symmetric, no curvature. EXTREMITIES:no edema, no clubbing, no cyanosis  NEURO: alert & oriented x 3 with fluent speech, no focal motor/sensory deficits, gait normal     STUDIES/RESULTS: Mr Breast Bilateral W Wo Contrast  05/20/2012  *RADIOLOGY REPORT*  Clinical Data: Newly-diagnosed left breast 11 o'clock location invasive ductal carcinoma.  BUN and creatinine were obtained on site at [facility] at [address] Results:  BUN 24 mg/dL,  Creatinine 1.1 mg/dL.  BILATERAL BREAST MRI WITH AND WITHOUT CONTRAST  Technique: Multiplanar, multisequence MR images of both breasts were obtained prior to and following the intravenous administration of 14ml of Multihance.  Three dimensional images were evaluated at the independent DynaCad workstation.  Comparison:  prior mammograms and ultrasound  Findings: No lymphadenopathy is identified.  There is minimal cortical lobulation in a left axillary lymph node which is unchanged on prior dissimilar studies dating back at least 2008. Minimal post biopsy changes noted in the left breast 11 o'clock location.  A few central bilateral T2 hyperintense cysts are incidentally noted.  On postcontrast images, there is a moderate background parenchymal enhancement pattern.   In the left breast 11 o'clock location periareolar area, there is an irregular enhancing mass with internal clip artifact measuring 1.6 x 1.3 x 0.9 cm.  This corresponds to the biopsy-proven breast cancer.  This demonstrates predominately plateau type enhancement kinetics.  There are numerous confluent foci of enhancement in both central breasts which do not meet quantitative threshold for significant contrast enhancement and measure less than 5 mm.  This may decrease the sensitivity for detection of malignancy.  No other area of dominant abnormal enhancement is seen in either breast.  IMPRESSION: Solitary abnormal enhancing mass left breast 11 o'clock location, corresponding to the biopsy-proven breast cancer.  Allowing for the presence of mild to moderate bilateral central breast confluent foci of enhancement, which may decrease the sensitivity for detection of malignancy, no other dominant area of enhancement is seen to suggest multifocal, multicentric, or contralateral malignancy.  RECOMMENDATION: Treatment plan  THREE-DIMENSIONAL MR IMAGE RENDERING ON INDEPENDENT WORKSTATION:  Three-dimensional MR images were rendered by post-processing of the original MR data on an independent workstation.  The three- dimensional MR images were interpreted, and  findings were reported in the accompanying complete MRI report for this study.  BI-RADS CATEGORY 6:  Known biopsy-proven malignancy - appropriate action should be taken.   Original Report Authenticated By: Christiana Pellant, M.D.    Nm Sentinel Node Inj-no Rpt (breast)  06/10/2012  CLINICAL DATA: left axillary sentinel node biopsy   Sulfur colloid was injected intradermally by the nuclear medicine  technologist for breast cancer sentinel node localization.     Mm Digital Diagnostic Unilat R  05/17/2012  *RADIOLOGY REPORT*  Clinical Data:  67 year old female with newly diagnosed left breast cancer.  Right mammogram - pre MRI.  DIGITAL DIAGNOSTIC RIGHT MAMMOGRAM WITH  CAD  Comparison: 11/05/2011 and prior mammograms dating back to 07/09/2005.  Findings:  ACR Breast Density Category 3: The breast tissue is heterogeneously dense.  There is no evidence of mass, distortion or suspicious calcifications.  Mammographic images were processed with CAD.  IMPRESSION: No mammographic evidence of right breast malignancy.  BI-RADS CATEGORY 1:  Negative.  RECOMMENDATION: Bilateral breast MRI, which has been scheduled.  I discussed the findings and recommendations with the patient and her questions answered.  She was encouraged to begin/continue monthly self exams and to contact her primary physician if any changes noted. A written report was given to the patient.   Original Report Authenticated By: Harmon Pier, M.D.    Mm Radiologist Eval And Mgmt  05/17/2012  *RADIOLOGY REPORT*  ESTABLISHED PATIENT OFFICE VISIT - LEVEL II [phone])  Chief Complaint:  The patient returns for results of her ultrasound guided left breast biopsy and also to evaluate the biopsy site. She has no complaints with the biopsy site currently.  History:  67 year old female with suspicious mass in the subareolar left breast, which was biopsied under ultrasound guidance yesterday.  Exam:  The patient's left breast biopsy site is clean and dry without evidence of hematoma or infection.  Pathology: INVASIVE DUCTAL CARCINOMA AND DCIS.  Histology correlates with imaging findings.  Assessment and Plan:  Recommend surgical consultation.  An appointment with Dr. Dwain Sarna has been scheduled for 05/18/2012 and the patient informed. Recommend bilateral breast MRI, which has been scheduled for 05/20/2012 and the patient informed.   Original Report Authenticated By: Harmon Pier, M.D.    [facility] [facility] Breast Loc Dev   1st Lesion  Inc US Guide  06/10/2012  *RADIOLOGY REPORT*  Clinical Data:  Patient presents for needle localization of a biopsy-proven malignancy over the 11 o'clock position of the left periareolar region.  NEEDLE LOCALIZATION  USING ULTRASOUND GUIDANCE AND SPECIMEN RADIOGRAPH  Comparison: Previous exams.  Patient presents for needle localization prior to surgical excision.  I met with the patient and we discussed the procedure of needle localization including benefits and alternatives. We discussed the high likelihood of a successful procedure. We discussed the risks of the procedure, including infection, bleeding, tissue injury, and further surgery. Informed, written consent was given.  Using ultrasound guidance, sterile technique, 2% lidocaine and a 7 cm ultra-wire device, the targeted left periareolar mass was localized using a lateral to medial to approach.  The films are marked for Dr. Dwain Sarna.  [facility] and confirms the wire, clip and targeted mass present in the tissue sample.  The specimen is marked for pathology.  IMPRESSION: Needle localization left breast.  No apparent complications.   Original Report Authenticated By: Elberta Fortis, M.D.      LABS:    Chemistry      Component Value Date/Time   NA 142  06/08/2012 0924   K 4.7 06/08/2012 0924   CL 101 06/08/2012 0924   CO2 31 06/08/2012 0924   BUN 25* 06/08/2012 0924   CREATININE 1.03 06/08/2012 0924      Component Value Date/Time   CALCIUM 10.4 06/08/2012 0924   ALKPHOS 65 06/08/2012 0924   AST 22 06/08/2012 0924   ALT 21 06/08/2012 0924   BILITOT 0.5 06/08/2012 0924      Lab Results  Component Value Date   WBC 6.2 06/14/2012   HGB 12.7 06/14/2012   HCT 37.1 06/14/2012   MCV 89.6 06/14/2012   PLT 222 06/14/2012   PATHOLOGY: ADDITIONAL INFORMATION: PROGNOSTIC INDICATORS - ACIS Results IMMUNOHISTOCHEMICAL AND MORPHOMETRIC ANALYSIS BY THE AUTOMATED CELLULAR IMAGING SYSTEM (ACIS) Estrogen Receptor (Negative, <1%): 100%, STRONG STAINING INTENSITY Progesterone Receptor (Negative, <1%): 100%, STRONG STAINING INTENSITY Proliferation Marker Ki67 by M IB-1 (Low<20%): 55% All controls stained appropriately Pecola Leisure MD Pathologist,  Electronic Signature ( Signed 05/19/2012) CHROMOGENIC IN-SITU HYBRIDIZATION Interpretation: HER2/NEU BY CISH - SHOWS AMPLIFICATION BY CISH ANALYSIS. THE RATIO OF HER2: CEP 17 SIGNALS WAS 2.45. THE TUMOR IS HETEROGENOUS IN REGARDS TO HER2 EXPRESSION. 40 INVASIVE TUMOR CELLS WERE EVALUATED. Reference range: Ratio: HER2:CEP17 < 1.8 gene amplification not observed Ratio: HER2:CEP 17 1.8-2.2 - equivocal result Ratio: HER2:CEP17 > 2.2 - gene amplification observed Pecola Leisure MD Pathologist, Electronic Signature ( Signed 05/18/2012) 1 of 2 FINAL for [name], [name] [phone]) FINAL DIAGNOSIS Diagnosis Breast, left, needle core biopsy, mass, 11 o'clock - INVASIVE DUCTAL CARCINOMA. - DUCTAL CARCINOMA IN SITU. Microscopic Comment There is invasive ductal carcinoma, consistent with Grade II/III. Breast prognostic profile will be performed   ASSESSMENT    67 year old female with  #1 new diagnosis of 1.6 cm (by MRI) invasive ductal carcinoma grade 2 ER +100% PR +100% HER-2/neu amplified with a ratio of 2.45 with an elevated Ki-67 of 55%. Patient is status post biopsy on 05/14/2012. Patient found a mass on self breast examination. Patient was seen by Dr. Harden Mo and he feels that the patient is a breast conservation candidate with lumpectomy and sentinel lymph node biopsy. She is now being seen for discussion of adjuvant treatment post lumpectomy.  #2 patient and I discussed her pathology in detail. We discussed the significance of grade as well as elevated Ki-67 and HER-2/neu positivity. We discussed use of HER-2 based treatment such as with Herceptin and chemotherapy. We discussed combination Herceptin and chemotherapy consisting of Taxotere carboplatinum and Herceptin. The Taxotere and carboplatinum would be given every 3 weeks for a total of 46 cycles. Herceptin would be given initially weekly with her chemotherapy and then changed to every 3 weeks to finish out one year of HER-2  therapy.  #3 patient certainly will need radiation therapy and she will be seen by Dr. Doristine Devoid.  #4 because patient's tumor is estrogen receptor positive she would also received adjuvant antiestrogen therapy in the form of an aromatase inhibitors and she is postmenopausal. We discussed the different aromatase inhibitors.  Clinical Trial Eligibility: no Multidisciplinary conference discussion he    PLAN:    #1 patient will proceed with her surgery with a lumpectomy and sentinel lymph node biopsy. We certainly will need a Port-A-Cath but it is really up to her whether she wants to have it done at the time of the surgery or wait to see the final pathology.  #2 she will get a radiation oncology consultation.  #3 should her final pathology be positive for HER-2/neu receptor then she  will receive chemotherapy and Herceptin. She will need an echocardiogram I will also make a cardio oncology referral. She will also need chemotherapy teaching class. We discussed all of this and the logistics of it.  #4 patient will be seen back after her surgery.       Discussion: Patient is being treated per NCCN breast cancer care guidelines appropriate for stage.I   Thank you so much for allowing me to participate in the care of [name]. I will continue to follow up the patient with you and assist in her care.  All questions were answered. The patient knows to call the clinic with any problems, questions or concerns. We can certainly see the patient much sooner if necessary.  I spent 60 minutes counseling the patient face to face. The total time spent in the appointment was 60 minutes.  Drue Second, MD Medical/Oncology [facility] [phone] (beeper) [phone] (Office)  06/14/2012, 11:19 AM

## 2012-06-14 NOTE — Progress Notes (Signed)
Checked in new pt with no financial concerns. °

## 2012-06-15 ENCOUNTER — Telehealth (INDEPENDENT_AMBULATORY_CARE_PROVIDER_SITE_OTHER): Payer: Self-pay | Admitting: General Surgery

## 2012-06-15 NOTE — Telephone Encounter (Signed)
Pt's daughter, Joni Reining, called for path results; not yet available.  Will call when results come in.

## 2012-06-17 ENCOUNTER — Encounter: Payer: Self-pay | Admitting: Radiation Oncology

## 2012-06-18 ENCOUNTER — Ambulatory Visit
Admission: RE | Admit: 2012-06-18 | Discharge: 2012-06-18 | Disposition: A | Discharge status: Home / Self Care | Payer: BC Managed Care – PPO | Source: Ambulatory Visit | Attending: Radiation Oncology | Admitting: Radiation Oncology

## 2012-06-18 ENCOUNTER — Encounter: Payer: Self-pay | Admitting: Radiation Oncology

## 2012-06-18 ENCOUNTER — Telehealth (INDEPENDENT_AMBULATORY_CARE_PROVIDER_SITE_OTHER): Payer: Self-pay | Admitting: General Surgery

## 2012-06-18 VITALS — BP 116/49 | HR 60 | Temp 98.4°F | Resp 20 | Ht 61.0 in | Wt 161.8 lb

## 2012-06-18 DIAGNOSIS — E785 Hyperlipidemia, unspecified: Secondary | ICD-10-CM | POA: Insufficient documentation

## 2012-06-18 DIAGNOSIS — C50119 Malignant neoplasm of central portion of unspecified female breast: Secondary | ICD-10-CM

## 2012-06-18 DIAGNOSIS — Z79899 Other long term (current) drug therapy: Secondary | ICD-10-CM | POA: Insufficient documentation

## 2012-06-18 DIAGNOSIS — C50919 Malignant neoplasm of unspecified site of unspecified female breast: Secondary | ICD-10-CM | POA: Insufficient documentation

## 2012-06-18 DIAGNOSIS — I10 Essential (primary) hypertension: Secondary | ICD-10-CM | POA: Insufficient documentation

## 2012-06-18 DIAGNOSIS — F329 Major depressive disorder, single episode, unspecified: Secondary | ICD-10-CM | POA: Insufficient documentation

## 2012-06-18 DIAGNOSIS — R011 Cardiac murmur, unspecified: Secondary | ICD-10-CM | POA: Insufficient documentation

## 2012-06-18 DIAGNOSIS — C50419 Malignant neoplasm of upper-outer quadrant of unspecified female breast: Secondary | ICD-10-CM

## 2012-06-18 DIAGNOSIS — M1990 Unspecified osteoarthritis, unspecified site: Secondary | ICD-10-CM | POA: Insufficient documentation

## 2012-06-18 DIAGNOSIS — F32A Depression, unspecified: Secondary | ICD-10-CM | POA: Insufficient documentation

## 2012-06-18 HISTORY — DX: Malignant neoplasm of unspecified site of unspecified female breast: C50.919

## 2012-06-18 NOTE — Telephone Encounter (Signed)
Daughter called to clarify that lymph nodes were removed to accomplish the biopsy.  Mother was told by the oncologist the node was taken out, but she did not understand this from Dr. Dwain Sarna.  Explained to daughter, who will explain to pt.

## 2012-06-18 NOTE — Addendum Note (Signed)
Encounter addended by: Porfirio Bollier Mintz Jonaven Hilgers, RN on: 06/18/2012  4:06 PM<BR>     Documentation filed: Charges VN

## 2012-06-18 NOTE — Patient Instructions (Signed)
   Department of Radiation Oncology  Phone:  [phone] Fax:        [phone]   What to Expect on Simulation Day  "Simulation" is the planning day. The goal for this day is to get everything set up to start your radiation treatments.  You will be getting a CT scan.  Here is a picture of the machine so we can better prepare you for what to expect during your visit: .   You may have had previous scans but this scan is different.  The doctor will use this scan to plan your treatments.   You will be with us for approximately 1 hour.  Upon arrival, register and get a pager; once the pager goes off, take the elevator to the ground level.  Someone will meet you and walk you to the CT simulator.  You will need to change into a gown.  You may also need to remove any jewelry.    We will be asking your name and birthday to verify your identity and ensure your safety.  We apologize in advance that the table that you will be laying on for your CT scan will be a hard surface.  This table surface is necessary to ensure the best treatment possible.    You will be lying on the table in the position that you will be treated in daily. This includes using different items such as molds or arm holders that will keep you in the same position every day.   It is very important while you are on the table to try to relax and hold as still as possible, during and after the CT scan.  Some scans require contrast.  Contrast is a tool that we use to highlight different areas that the doctor may want to see better on the scan.  We will let you know if you will need contrast before your simulation appointment.    After your scan while you are still on the table, the doctor will determine where to place tattoos on your skin. Tattoos are permanent marks that we use to ensure that you are in the right position for your treatment. We place the tattoos by using tattoo ink and a needle to make a small stick just  underneath your skin. They are very small and look like freckles.  You will only receive these on simulation day, not every day.   We will be taking photographs of the tattoos and your position on the table for documentation. These photos are in your chart and are only used by your radiation oncology team.    We will give you your daily schedule for your radiation treatments along with an explanation of what to expect on treatment day.   Thank you for allowing us to be a part of your care!  Please let us know if you have any questions! [facility]:  [phone]  

## 2012-06-18 NOTE — Progress Notes (Signed)
Please see the Nurse Progress Note in the MD Initial Consult Encounter for this patient. 

## 2012-06-18 NOTE — Progress Notes (Signed)
Pt states she occasionally has soreness, tenderness of biopsy site of left breast.  Works at [employer], receptionist.

## 2012-06-18 NOTE — Addendum Note (Signed)
Encounter addended by: Glennie Hawk, RN on: 06/18/2012 10:27 AM<BR>     Documentation filed: Charges VN

## 2012-06-18 NOTE — Progress Notes (Signed)
Radiation Oncology         [phone] ________________________________  Initial outpatient Consultation  Name: [name] MRN: [id number]  Date: 06/18/2012  DOB: [birth year]  WU:[id number],YNWGNF, MD  Emelia Loron, MD   REFERRING PHYSICIAN: Emelia Loron, MD  DIAGNOSIS: Pathologic T1c N1a M0 left breast cancer ER/PR positive HER-2/neu heterogeneous, grade 2, invasive ductal carcinoma  HISTORY OF PRESENT ILLNESS::[name] is a 67 y.o. female who underwent a BI-RADS 1 mammogram in June 2013. However, recently she self palpated a lump in her left breast. She underwent mammography in December 2013 for this. It demonstrated a suspicious periareolar mass in the 11:00 position of the left breast. On ultrasound it measured 9 x 11 x 7 mm. She underwent ultrasound-guided biopsy of this on 05/14/2012. The biopsy revealed ER/PR positive HER-2/neu heterogeneous invasive ductal carcinoma with ductal carcinoma in situ.  She underwent MRI of her breasts on 05/20/2012. This demonstrated a solitary abnormal enhancing left breast mass at the 11:00 location. It measured 1.6 x 1.3 x 0.9 cm. No lymphadenopathy was identified. There was minimal cortical lobulation in a left axillary lymph node which was unchanged compared to studies dating back to at least 2008.  The patient underwent left breast lumpectomy and sentinel lymph node biopsy on 06/10/2012. This demonstrated a 1.2 cm tumor of invasive ductal carcinoma. There was lymphovascular space invasion. There was ductal carcinoma in situ with calcifications and comedo necrosis in the specimen. One out of one lymph nodes were positive. There is no extracapsular extension. The tumor deposit was 1.1 cm and the positive lymph node. The patient's grade is 2. We discussed her pathology in detail at her tumor board conference.Her margins are technically negative per Dr. Dwain Sarna , her surgeon. He commented that the DCIS extended up to the patient's skin.  The patient has a close anterior margin to her invasive disease.  The patient has met with medical oncology and plans to start chemotherapy in the next few weeks. She works as a Scientist, physiological. She lives in [place] but grew up in [place]. She is a nonsmoker. She was on hormone replacement therapy for about 6 months around age 77. She denies any prior history of cancer or radiotherapy. She is a limited family history. She has seen a Runner, broadcasting/film/video but her insurance would not cover testing.  Her main complaint today is some left sided post surgical breast pain.  PREVIOUS RADIATION THERAPY: No  PAST MEDICAL HISTORY:  has a past medical history of Hyperlipidemia; Invasive ductal carcinoma of breast (2013); Arthritis; Depression; Heart murmur; and Hypertension.    PAST SURGICAL HISTORY: Past Surgical History  Procedure Date  . Tubal ligation 1988  . Bladder suspension 10/2010  . Tonsillectomy and adenoidectomy     age 48  . Abdominal adhesion surgery 1980  . Benign breast biopsy 1994    Left  . Breast lumpectomy with needle localization and axillary sentinel lymph node bx 06/10/2012    Procedure: BREAST LUMPECTOMY WITH NEEDLE LOCALIZATION AND AXILLARY SENTINEL LYMPH NODE BX;  Surgeon: Emelia Loron, MD;  Location: [facility];  Service: General;  Laterality: Left;  . Appendectomy   . Cholecystectomy     FAMILY HISTORY: family history includes Breast cancer (age of onset:54) in her sister; Colon cancer (age of onset:63) in her mother; Prostate cancer (age of onset:83) in her father; and Stomach cancer (age of onset:67) in her maternal grandmother.  SOCIAL HISTORY:  reports that she has never smoked. She has never used  smokeless tobacco. She reports that she drinks about .6 ounces of alcohol per week. She reports that she does not use illicit drugs.  ALLERGIES: Review of patient's allergies indicates no known allergies.  MEDICATIONS:  Current Outpatient  Prescriptions  Medication Sig Dispense Refill  . Ascorbic Acid (VITAMIN C) 1000 MG tablet Take 1,000 mg by mouth daily.        Marland Kitchen aspirin 325 MG tablet Take 325 mg by mouth daily.        Marland Kitchen atenolol (TENORMIN) 50 MG tablet Take 1 tablet by mouth Daily.      . Cholecalciferol (VITAMIN D) 2000 UNITS CAPS Take 1 capsule by mouth daily.        . citalopram (CELEXA) 10 MG tablet       . olmesartan-hydrochlorothiazide (BENICAR HCT) 40-12.5 MG per tablet Take 1 tablet by mouth daily.      Marland Kitchen omeprazole (PRILOSEC) 40 MG capsule Take 1 tablet by mouth Daily.      Marland Kitchen oxyCODONE-acetaminophen (PERCOCET/ROXICET) 5-325 MG per tablet       . simvastatin (ZOCOR) 80 MG tablet Take 40 mg by mouth Daily.      . vitamin E 400 UNIT capsule Take 400 Units by mouth daily.          REVIEW OF SYSTEMS: Pertinent items are noted in HPI.   PHYSICAL EXAM:  height is 5\' 1"  (1.549 m) and weight is 161 lb 12.8 oz (73.392 kg). Her oral temperature is 98.4 F (36.9 C). Her blood pressure is 116/49 and her pulse is 60. Her respiration is 20.   General: Alert and oriented, in no acute distress HEENT: Head is normocephalic. Pupils are equally round and reactive to light. Extraocular movements are intact. Oropharynx is clear. Neck: Neck is supple, no palpable cervical or supraclavicular lymphadenopathy. Heart: Regular in rate and rhythm. Notable for a soft murmur in the aortic region. I talked to the patient about this and told her to mention it to her primary physician. Chest: Clear to auscultation bilaterally, with no rhonchi, wheezes, or rales. Abdomen: Soft, nontender, nondistended, with no rigidity or guarding. Extremities: No cyanosis or edema. Lymphatics: No concerning lymphadenopathy. Skin: No concerning lesions. Musculoskeletal: symmetric strength and muscle tone throughout. Neurologic: Cranial nerves II through XII are grossly intact. No obvious focalities. Speech is fluent. Coordination is intact. Psychiatric:  Judgment and insight are intact. Affect is appropriate. Breasts: Notable for healing left axillary and lumpectomy scars. The left breast is mildly swollen. Right breast is unremarkable. No palpable axillary adenopathy on the right. Patient was too tender for deep palpation on the left side.   LABORATORY DATA:  Lab Results  Component Value Date   WBC 6.2 06/14/2012   HGB 12.7 06/14/2012   HCT 37.1 06/14/2012   MCV 89.6 06/14/2012   PLT 222 06/14/2012   CMP     Component Value Date/Time   NA 140 06/14/2012 1008   NA 142 06/08/2012 0924   K 3.9 06/14/2012 1008   K 4.7 06/08/2012 0924   CL 103 06/14/2012 1008   CL 101 06/08/2012 0924   CO2 30* 06/14/2012 1008   CO2 31 06/08/2012 0924   GLUCOSE 88 06/14/2012 1008   GLUCOSE 87 06/08/2012 0924   BUN 26.0 06/14/2012 1008   BUN 25* 06/08/2012 0924   CREATININE 1.2* 06/14/2012 1008   CREATININE 1.03 06/08/2012 0924   CALCIUM 10.1 06/14/2012 1008   CALCIUM 10.4 06/08/2012 0924   PROT 7.2 06/14/2012 1008   PROT 7.5  06/08/2012 0924   ALBUMIN 3.6 06/14/2012 1008   ALBUMIN 4.0 06/08/2012 0924   AST 22 06/14/2012 1008   AST 22 06/08/2012 0924   ALT 28 06/14/2012 1008   ALT 21 06/08/2012 0924   ALKPHOS 71 06/14/2012 1008   ALKPHOS 65 06/08/2012 0924   BILITOT 0.66 06/14/2012 1008   BILITOT 0.5 06/08/2012 0924   GFRNONAA 55* 06/08/2012 0924   GFRAA 63* 06/08/2012 0924         RADIOGRAPHY: as above     IMPRESSION/PLAN: This is a very pleasant 67 year old woman with pathologic T1c N1a ER/PR positive HER-2/neu heterogeneous grade 2 invasive ductal carcinoma of the left breast. We discussed her at our tumor board. No axillary lymph node dissection will be done but she did have one out of one positive lymph nodes. The plan is that she will start chemotherapy. When she is done with her chemotherapy she will be referred back to me for radiotherapy planning. I plan to treat the patient's left breast as well as her axillary and supraclavicular lymph nodes. I told her it will take about 6  weeks for her to complete her radiotherapy. She may prefer morning appointments as she thinks she will want to continue working through radiotherapy. We discussed the risks benefits and side effects of radiotherapy in detail. She understands that treatment will probably cause fatigue and skin irritation. The risk of damage to her internal organs is rare. There is some risk of lymphedema. I will go over consent form with her at her followup appointment. She is enthusiastic about proceeding with treatment. I've given her my contact information should she have questions in the future.  I spent 30 minutes minutes face to face with the patient and more than 50% of that time was spent in counseling and/or coordination of care.   __________________________________________  Lonie Peak, MD

## 2012-06-28 ENCOUNTER — Telehealth: Payer: Self-pay | Admitting: Oncology

## 2012-06-28 ENCOUNTER — Telehealth: Payer: Self-pay | Admitting: *Deleted

## 2012-06-28 ENCOUNTER — Encounter: Payer: Self-pay | Admitting: Oncology

## 2012-06-28 NOTE — Telephone Encounter (Signed)
Per staff message and POF I have scheduled appts.  JMW  

## 2012-06-28 NOTE — Telephone Encounter (Signed)
s/w pt and she is aware of her appts,email to mw to add chemo       anne

## 2012-06-28 NOTE — Progress Notes (Signed)
Put [employer] cancer form in registration desk.

## 2012-06-30 ENCOUNTER — Encounter (INDEPENDENT_AMBULATORY_CARE_PROVIDER_SITE_OTHER): Payer: Self-pay | Admitting: General Surgery

## 2012-06-30 ENCOUNTER — Ambulatory Visit (INDEPENDENT_AMBULATORY_CARE_PROVIDER_SITE_OTHER): Payer: BC Managed Care – PPO | Admitting: General Surgery

## 2012-06-30 VITALS — BP 110/64 | HR 76 | Temp 98.0°F | Resp 16 | Ht 62.0 in | Wt 159.2 lb

## 2012-06-30 DIAGNOSIS — Z09 Encounter for follow-up examination after completed treatment for conditions other than malignant neoplasm: Secondary | ICD-10-CM

## 2012-06-30 NOTE — Progress Notes (Signed)
Subjective:     Patient ID: [name], female   DOB: 06/26/1945, 66 y.o.   MRN: [id number]  HPI 66 yof s/p lumpectomy/snbx for node positive breast cancer.  I have discussed margins with pathologist and these indeed are negative due to the skin I removed.  She is doing well after surgery.  She is also her2 amplified.  She comes in today without any significant complaints.   Review of Systems     Objective:   Physical Exam Healing incisions without infection axilla and breast    Assessment:     Stage II left breast cancer    Plan:     She is doing well s/p surgery. She has chemo planned to begin 12 February.  We discussed port placement and I showed her the device and where it would go.  We discussed risks including bleeding, infection, pneumothorax.  Will proceed asap.      

## 2012-07-01 ENCOUNTER — Encounter (HOSPITAL_BASED_OUTPATIENT_CLINIC_OR_DEPARTMENT_OTHER): Payer: Self-pay | Admitting: *Deleted

## 2012-07-01 NOTE — Progress Notes (Signed)
Pt here for lumpectomy-did well-had labs cancer center 06/15/12-cbc cmet ekg-1/14 To come for pac

## 2012-07-06 ENCOUNTER — Ambulatory Visit (HOSPITAL_BASED_OUTPATIENT_CLINIC_OR_DEPARTMENT_OTHER)
Admission: RE | Admit: 2012-07-06 | Discharge: 2012-07-06 | Disposition: A | Discharge status: Home / Self Care | Payer: BC Managed Care – PPO | Source: Ambulatory Visit | Attending: Internal Medicine | Admitting: Internal Medicine

## 2012-07-06 ENCOUNTER — Ambulatory Visit (HOSPITAL_COMMUNITY)
Admission: RE | Admit: 2012-07-06 | Discharge: 2012-07-06 | Disposition: A | Discharge status: Home / Self Care | Payer: BC Managed Care – PPO | Source: Ambulatory Visit | Attending: Internal Medicine | Admitting: Internal Medicine

## 2012-07-06 ENCOUNTER — Encounter (HOSPITAL_COMMUNITY): Payer: Self-pay

## 2012-07-06 VITALS — BP 136/70 | HR 62 | Ht 62.0 in | Wt 161.8 lb

## 2012-07-06 DIAGNOSIS — I10 Essential (primary) hypertension: Secondary | ICD-10-CM | POA: Insufficient documentation

## 2012-07-06 DIAGNOSIS — Z01810 Encounter for preprocedural cardiovascular examination: Secondary | ICD-10-CM | POA: Insufficient documentation

## 2012-07-06 DIAGNOSIS — C50919 Malignant neoplasm of unspecified site of unspecified female breast: Secondary | ICD-10-CM

## 2012-07-06 DIAGNOSIS — Z09 Encounter for follow-up examination after completed treatment for conditions other than malignant neoplasm: Secondary | ICD-10-CM

## 2012-07-06 DIAGNOSIS — I079 Rheumatic tricuspid valve disease, unspecified: Secondary | ICD-10-CM | POA: Insufficient documentation

## 2012-07-06 DIAGNOSIS — C50419 Malignant neoplasm of upper-outer quadrant of unspecified female breast: Secondary | ICD-10-CM

## 2012-07-06 NOTE — Progress Notes (Signed)
Echocardiogram 2D Echocardiogram has been performed.  Kaytlyn Din 07/06/2012, 9:09 AM

## 2012-07-06 NOTE — Assessment & Plan Note (Addendum)
Explained the purpose of HF clinic as it relates to breast cancer treatment. Explained that ~10 % of women develop cardiotoxicity while receiving Herceptin. Dr Gala Romney discussed and reviewed ECHO during OV. Follow up in 3 months with an ECHO.  Patient seen and examined with Tonye Becket, NP. We discussed all aspects of the encounter. I agree with the assessment and plan as stated above. Role of cardio-oncology clinic explained in detail. I reviewed her ech personally. EF and Doppler parameters look good. Ok to start Herceptin. Will proceed with echos every 3 months,

## 2012-07-06 NOTE — Patient Instructions (Addendum)
Follow up in 3 months with an ECHO 

## 2012-07-06 NOTE — Progress Notes (Signed)
Patient ID: [name], female   DOB: 1946-05-14, 67 y.o.   MRN: [id number] Onocologist: Dr Welton Flakes General Surgeon: Dr Dwain Sarna PCP: Dr Eula Listen  HPI:  [name] is a 67 year old with PMH of depression, HTN and triple-positive breast cancer - invasive ductal carcinoma grade 2 ER +100% PR +100% HER-2/neu, depression, HTN, and  amplified with a ratio of 2.45 with an elevated Ki-67 of 55%.    She presents as a new patient at the request of Dr Welton Flakes to enroll in cardio-oncology clinic. Denies SOB/PND/Orthopnea. Does not exercise. Works full time at [employer] as a Scientist, physiological. She denies any h/o known heart disease. HTN has been well controlled.  Plan to treat with combination Herceptin and chemotherapy consisting of Taxotere carboplatinum and Herceptin.  Taxotere and carboplatinum will be given every 3 weeks for a total of 46 cycles. Herceptin would be given initially weekly with her chemotherapy and then changed to every 3 weeks to finish out one year of HER-2 therapy. Plan to start Herceptin 07/14/12  ECHO EF 60% Lateral S' 8.4 Pre treatment   Review of Systems:     Cardiac Review of Systems: {Y] = yes [ ]  = no  Chest Pain [    ]  Resting SOB [   ] Exertional SOB  [  ]  Orthopnea [  ]   Pedal Edema [   ]    Palpitations [  ] Syncope  [  ]   Presyncope [   ]  General Review of Systems: [Y] = yes [  ]=no Constitional: recent weight change [  ]; anorexia [  ]; fatigue [  ]; nausea [  ]; night sweats [  ]; fever [  ]; or chills [  ];                                                                                                                                          Dental: poor dentition[  ]; Last Dentist visit:  Eye : blurred vision [  ]; diplopia [   ]; vision changes [  ];  Amaurosis fugax[  ]; Resp: cough [  ];  wheezing[  ];  hemoptysis[  ]; shortness of breath[  ]; paroxysmal nocturnal dyspnea[  ]; dyspnea on exertion[  ]; or orthopnea[  ];  GI:  gallstones[  ], vomiting[  ];  dysphagia[   ]; melena[  ];  hematochezia [  ]; heartburn[  ];   Hx of  Colonoscopy[  ]; GU: kidney stones [  ]; hematuria[  ];   dysuria [  ];  nocturia[  ];  history of     obstruction [  ];                 Skin: rash, swelling[  ];, hair loss[  ];  peripheral edema[  ];  or itching[  ];  Musculosketetal: myalgias[  ];  joint swelling[  ];  joint erythema[  ];  joint pain[  ];  back pain[  ];  Heme/Lymph: bruising[  ];  bleeding[  ];  anemia[  ];  Neuro: TIA[  ];  headaches[  ];  stroke[  ];  vertigo[  ];  seizures[  ];   paresthesias[  ];  difficulty walking[  ];  Psych:depression[ Y ]; anxiety[  ];  Endocrine: diabetes[  ];  thyroid dysfunction[  ];  Immunizations: Flu [  ]; Pneumococcal[  ];  Other:    Past Medical History  Diagnosis Date  . Hyperlipidemia   . Invasive ductal carcinoma of breast 2013    Left  . Arthritis   . Depression   . Heart murmur     heard one when she was pregnamt-maybe had echo-cannot remember  . Hypertension     Current Outpatient Prescriptions  Medication Sig Dispense Refill  . Ascorbic Acid (VITAMIN C) 1000 MG tablet Take 1,000 mg by mouth daily.        Marland Kitchen aspirin 325 MG tablet Take 325 mg by mouth daily.        Marland Kitchen atenolol (TENORMIN) 50 MG tablet Take 1 tablet by mouth Daily.      . Cholecalciferol (VITAMIN D) 2000 UNITS CAPS Take 1 capsule by mouth daily.        . citalopram (CELEXA) 10 MG tablet       . olmesartan-hydrochlorothiazide (BENICAR HCT) 40-12.5 MG per tablet Take 1 tablet by mouth daily.      Marland Kitchen omeprazole (PRILOSEC) 40 MG capsule Take 1 tablet by mouth Daily.      Marland Kitchen oxyCODONE-acetaminophen (PERCOCET/ROXICET) 5-325 MG per tablet       . simvastatin (ZOCOR) 80 MG tablet Take 40 mg by mouth Daily.      . vitamin E 400 UNIT capsule Take 400 Units by mouth daily.           No Known Allergies  History   Social History  . Marital Status: Legally Separated    Spouse Name: N/A    Number of Children: N/A  . Years of Education: N/A   Occupational  History  . Not on file.   Social History Main Topics  . Smoking status: Never Smoker   . Smokeless tobacco: Never Used  . Alcohol Use: 0.6 oz/week    1 Glasses of wine per week     Comment: 1 glass of Wine per Week  . Drug Use: No  . Sexually Active: Not Currently     Comment: Menarche age 68, parity age 67, G35, P28, Menopause age 89, No HRT   Other Topics Concern  . Not on file   Social History Narrative  . No narrative on file    Family History  Problem Relation Age of Onset  . Colon cancer Mother 68  . Breast cancer Sister 29  . Prostate cancer Father 48  . Stomach cancer Maternal Grandmother 62    PHYSICAL EXAM: Filed Vitals:   07/06/12 0857  BP: 136/70  Pulse: 62   General:  Well appearing. No respiratory difficulty HEENT: normal Neck: supple. no JVD. Carotids 2+ bilat; no bruits. No lymphadenopathy or thryomegaly appreciated. Cor: PMI nondisplaced. Regular rate & rhythm. No rubs, gallops TR Lungs: clear Abdomen: soft, nontender, nondistended. No hepatosplenomegaly. No bruits or masses. Good bowel sounds. Extremities: no cyanosis, clubbing, rash, edema Neuro: alert & oriented x 3, cranial nerves grossly intact. moves all 4  extremities w/o difficulty. Affect pleasant.   No results found for this or any previous visit (from the past 24 hour(s)). No results found.   ASSESSMENT & PLAN:

## 2012-07-07 ENCOUNTER — Ambulatory Visit (HOSPITAL_BASED_OUTPATIENT_CLINIC_OR_DEPARTMENT_OTHER)
Admission: RE | Admit: 2012-07-07 | Discharge: 2012-07-07 | Disposition: A | Discharge status: Home / Self Care | Payer: BC Managed Care – PPO | Source: Ambulatory Visit | Attending: General Surgery | Admitting: General Surgery

## 2012-07-07 ENCOUNTER — Encounter (HOSPITAL_BASED_OUTPATIENT_CLINIC_OR_DEPARTMENT_OTHER)
Admission: RE | Disposition: A | Discharge status: Home / Self Care | Payer: Self-pay | Source: Ambulatory Visit | Attending: General Surgery

## 2012-07-07 ENCOUNTER — Encounter (HOSPITAL_BASED_OUTPATIENT_CLINIC_OR_DEPARTMENT_OTHER): Payer: Self-pay | Admitting: Anesthesiology

## 2012-07-07 ENCOUNTER — Ambulatory Visit (HOSPITAL_COMMUNITY): Payer: BC Managed Care – PPO

## 2012-07-07 ENCOUNTER — Encounter (HOSPITAL_BASED_OUTPATIENT_CLINIC_OR_DEPARTMENT_OTHER): Payer: Self-pay | Admitting: *Deleted

## 2012-07-07 ENCOUNTER — Ambulatory Visit (HOSPITAL_BASED_OUTPATIENT_CLINIC_OR_DEPARTMENT_OTHER): Payer: BC Managed Care – PPO | Admitting: Anesthesiology

## 2012-07-07 DIAGNOSIS — C773 Secondary and unspecified malignant neoplasm of axilla and upper limb lymph nodes: Secondary | ICD-10-CM | POA: Insufficient documentation

## 2012-07-07 DIAGNOSIS — C50419 Malignant neoplasm of upper-outer quadrant of unspecified female breast: Secondary | ICD-10-CM | POA: Insufficient documentation

## 2012-07-07 DIAGNOSIS — C50119 Malignant neoplasm of central portion of unspecified female breast: Secondary | ICD-10-CM | POA: Insufficient documentation

## 2012-07-07 DIAGNOSIS — I10 Essential (primary) hypertension: Secondary | ICD-10-CM | POA: Insufficient documentation

## 2012-07-07 DIAGNOSIS — C50919 Malignant neoplasm of unspecified site of unspecified female breast: Secondary | ICD-10-CM

## 2012-07-07 HISTORY — PX: PORTACATH PLACEMENT: SHX2246

## 2012-07-07 LAB — POCT HEMOGLOBIN-HEMACUE: Hemoglobin: 11.1 g/dL — ABNORMAL LOW (ref 12.0–15.0)

## 2012-07-07 SURGERY — INSERTION, TUNNELED CENTRAL VENOUS DEVICE, WITH PORT
Anesthesia: General | Site: Chest | Laterality: Left | Wound class: Clean

## 2012-07-07 MED ORDER — MIDAZOLAM HCL 5 MG/5ML IJ SOLN
INTRAMUSCULAR | Status: DC | PRN
  Administered 2012-07-07: 2 mg via INTRAVENOUS

## 2012-07-07 MED ORDER — FENTANYL CITRATE 0.05 MG/ML IJ SOLN: 50.0000 ug | INTRAMUSCULAR | Status: DC | PRN

## 2012-07-07 MED ORDER — ONDANSETRON HCL 4 MG/2ML IJ SOLN: 4.0000 mg | Freq: Once | INTRAMUSCULAR | Status: DC | PRN

## 2012-07-07 MED ORDER — CEFAZOLIN SODIUM-DEXTROSE 2-3 GM-% IV SOLR
2.0000 g | INTRAVENOUS | Status: AC
  Administered 2012-07-07: 2 g via INTRAVENOUS

## 2012-07-07 MED ORDER — LIDOCAINE HCL (CARDIAC) 20 MG/ML IV SOLN
INTRAVENOUS | Status: DC | PRN
  Administered 2012-07-07: 80 mg via INTRAVENOUS

## 2012-07-07 MED ORDER — LACTATED RINGERS IV SOLN
INTRAVENOUS | Status: DC
  Administered 2012-07-07 (×2): via INTRAVENOUS

## 2012-07-07 MED ORDER — FENTANYL CITRATE 0.05 MG/ML IJ SOLN
INTRAMUSCULAR | Status: DC | PRN
  Administered 2012-07-07: 100 ug via INTRAVENOUS

## 2012-07-07 MED ORDER — OXYCODONE-ACETAMINOPHEN 5-325 MG PO TABS: 1.0000 | ORAL_TABLET | ORAL | Status: DC | PRN

## 2012-07-07 MED ORDER — OXYCODONE HCL 5 MG PO TABS: 5.0000 mg | ORAL_TABLET | Freq: Once | ORAL | Status: DC | PRN

## 2012-07-07 MED ORDER — PROPOFOL 10 MG/ML IV BOLUS
INTRAVENOUS | Status: DC | PRN
  Administered 2012-07-07: 200 mg via INTRAVENOUS

## 2012-07-07 MED ORDER — DEXAMETHASONE SODIUM PHOSPHATE 4 MG/ML IJ SOLN
INTRAMUSCULAR | Status: DC | PRN
  Administered 2012-07-07: 10 mg via INTRAVENOUS

## 2012-07-07 MED ORDER — HYDROMORPHONE HCL PF 1 MG/ML IJ SOLN
0.2500 mg | INTRAMUSCULAR | Status: DC | PRN
  Administered 2012-07-07 (×2): 0.5 mg via INTRAVENOUS

## 2012-07-07 MED ORDER — HEPARIN (PORCINE) IN NACL 2-0.9 UNIT/ML-% IJ SOLN
INTRAMUSCULAR | Status: DC | PRN
  Administered 2012-07-07: 1 via INTRAVENOUS

## 2012-07-07 MED ORDER — CEFAZOLIN SODIUM-DEXTROSE 2-3 GM-% IV SOLR: 2.0000 g | Freq: Once | INTRAVENOUS | Status: DC

## 2012-07-07 MED ORDER — GLYCOPYRROLATE 0.2 MG/ML IJ SOLN
INTRAMUSCULAR | Status: DC | PRN
  Administered 2012-07-07: 0.2 mg via INTRAVENOUS

## 2012-07-07 MED ORDER — BUPIVACAINE HCL (PF) 0.25 % IJ SOLN
INTRAMUSCULAR | Status: DC | PRN
  Administered 2012-07-07: 15 mL

## 2012-07-07 MED ORDER — OXYCODONE HCL 5 MG/5ML PO SOLN: 5.0000 mg | Freq: Once | ORAL | Status: DC | PRN

## 2012-07-07 MED ORDER — ONDANSETRON HCL 4 MG/2ML IJ SOLN
INTRAMUSCULAR | Status: DC | PRN
  Administered 2012-07-07: 4 mg via INTRAVENOUS

## 2012-07-07 MED ORDER — HEPARIN SOD (PORK) LOCK FLUSH 100 UNIT/ML IV SOLN
INTRAVENOUS | Status: DC | PRN
  Administered 2012-07-07: 300 [IU] via INTRAVENOUS

## 2012-07-07 MED ORDER — MIDAZOLAM HCL 2 MG/2ML IJ SOLN: 1.0000 mg | INTRAMUSCULAR | Status: DC | PRN

## 2012-07-07 SURGICAL SUPPLY — 50 items
BAG DECANTER FOR FLEXI CONT (MISCELLANEOUS) ×2 IMPLANT
BENZOIN TINCTURE PRP APPL 2/3 (GAUZE/BANDAGES/DRESSINGS) IMPLANT
BLADE SURG 11 STRL SS (BLADE) ×2 IMPLANT
BLADE SURG 15 STRL LF DISP TIS (BLADE) ×1 IMPLANT
BLADE SURG 15 STRL SS (BLADE) ×1
CANISTER SUCTION 1200CC (MISCELLANEOUS) IMPLANT
CHLORAPREP W/TINT 26ML (MISCELLANEOUS) ×2 IMPLANT
CLOTH BEACON ORANGE TIMEOUT ST (SAFETY) ×2 IMPLANT
COVER MAYO STAND STRL (DRAPES) ×2 IMPLANT
COVER TABLE BACK 60X90 (DRAPES) ×2 IMPLANT
DECANTER SPIKE VIAL GLASS SM (MISCELLANEOUS) ×2 IMPLANT
DERMABOND ADVANCED (GAUZE/BANDAGES/DRESSINGS) ×1
DERMABOND ADVANCED .7 DNX12 (GAUZE/BANDAGES/DRESSINGS) ×1 IMPLANT
DRAPE C-ARM 42X72 X-RAY (DRAPES) ×2 IMPLANT
DRAPE LAPAROSCOPIC ABDOMINAL (DRAPES) ×2 IMPLANT
DRSG TEGADERM 4X4.75 (GAUZE/BANDAGES/DRESSINGS) IMPLANT
ELECT COATED BLADE 2.86 ST (ELECTRODE) ×2 IMPLANT
ELECT REM PT RETURN 9FT ADLT (ELECTROSURGICAL) ×2
ELECTRODE REM PT RTRN 9FT ADLT (ELECTROSURGICAL) ×1 IMPLANT
GAUZE SPONGE 4X4 12PLY STRL LF (GAUZE/BANDAGES/DRESSINGS) IMPLANT
GLOVE BIO SURGEON STRL SZ7 (GLOVE) ×4 IMPLANT
GLOVE BIOGEL PI IND STRL 7.0 (GLOVE) ×1 IMPLANT
GLOVE BIOGEL PI IND STRL 7.5 (GLOVE) ×1 IMPLANT
GLOVE BIOGEL PI INDICATOR 7.0 (GLOVE) ×1
GLOVE BIOGEL PI INDICATOR 7.5 (GLOVE) ×1
GOWN PREVENTION PLUS XLARGE (GOWN DISPOSABLE) ×4 IMPLANT
IV HEPARIN 1000UNITS/500ML (IV SOLUTION) ×2 IMPLANT
IV KIT MINILOC 20X1 SAFETY (NEEDLE) IMPLANT
KIT PORT POWER 8FR ISP CVUE (Catheter) ×2 IMPLANT
NDL SAFETY ECLIPSE 18X1.5 (NEEDLE) IMPLANT
NEEDLE HYPO 18GX1.5 SHARP (NEEDLE)
NEEDLE HYPO 25X1 1.5 SAFETY (NEEDLE) ×2 IMPLANT
PACK BASIN DAY SURGERY FS (CUSTOM PROCEDURE TRAY) ×2 IMPLANT
PENCIL BUTTON HOLSTER BLD 10FT (ELECTRODE) ×2 IMPLANT
SLEEVE SCD COMPRESS KNEE MED (MISCELLANEOUS) ×2 IMPLANT
STAPLER VISISTAT 35W (STAPLE) ×2 IMPLANT
STRIP CLOSURE SKIN 1/2X4 (GAUZE/BANDAGES/DRESSINGS) IMPLANT
SUT MON AB 4-0 PC3 18 (SUTURE) ×2 IMPLANT
SUT PROLENE 2 0 SH DA (SUTURE) ×2 IMPLANT
SUT SILK 2 0 TIES 17X18 (SUTURE)
SUT SILK 2-0 18XBRD TIE BLK (SUTURE) IMPLANT
SUT VIC AB 3-0 SH 27 (SUTURE) ×1
SUT VIC AB 3-0 SH 27X BRD (SUTURE) ×1 IMPLANT
SYR 5ML LUER SLIP (SYRINGE) ×2 IMPLANT
SYR CONTROL 10ML LL (SYRINGE) ×2 IMPLANT
TOWEL OR 17X24 6PK STRL BLUE (TOWEL DISPOSABLE) ×2 IMPLANT
TOWEL OR NON WOVEN STRL DISP B (DISPOSABLE) ×4 IMPLANT
TUBE CONNECTING 20X1/4 (TUBING) IMPLANT
WATER STERILE IRR 1000ML POUR (IV SOLUTION) IMPLANT
YANKAUER SUCT BULB TIP NO VENT (SUCTIONS) IMPLANT

## 2012-07-07 NOTE — Transfer of Care (Signed)
Immediate Anesthesia Transfer of Care Note  Patient: [name]  Procedure(s) Performed: Procedure(s) (LRB) with comments: INSERTION PORT-A-CATH (Left) - Port a cath insertion  Patient Location: PACU  Anesthesia Type:General  Level of Consciousness: sedated and patient cooperative  Airway & Oxygen Therapy: Patient Spontanous Breathing and Patient connected to face mask oxygen  Post-op Assessment: Report given to PACU RN and Post -op Vital signs reviewed and stable  Post vital signs: Reviewed and stable  Complications: No apparent anesthesia complications

## 2012-07-07 NOTE — Interval H&P Note (Signed)
History and Physical Interval Note:  07/07/2012 1:12 PM  [name]  has presented today for surgery, with the diagnosis of breast cancer  The various methods of treatment have been discussed with the patient and family. After consideration of risks, benefits and other options for treatment, the patient has consented to  Procedure(s) (LRB) with comments: INSERTION PORT-A-CATH (N/A) - porth a cath insertion as a surgical intervention .  The patient's history has been reviewed, patient examined, no change in status, stable for surgery.  I have reviewed the patient's chart and labs.  Questions were answered to the patient's satisfaction.     Mirza Fessel

## 2012-07-07 NOTE — Anesthesia Preprocedure Evaluation (Signed)

## 2012-07-07 NOTE — Anesthesia Postprocedure Evaluation (Signed)
  Anesthesia Post-op Note  Patient: [name]  Procedure(s) Performed: Procedure(s) (LRB) with comments: INSERTION PORT-A-CATH (Left) - Port a cath insertion  Patient Location: PACU  Anesthesia Type:General  Level of Consciousness: awake, alert  and oriented  Airway and Oxygen Therapy: Patient Spontanous Breathing and Patient connected to face mask oxygen  Post-op Pain: mild  Post-op Assessment: Post-op Vital signs reviewed  Post-op Vital Signs: Reviewed  Complications: No apparent anesthesia complications

## 2012-07-07 NOTE — Anesthesia Procedure Notes (Signed)
Procedure Name: LMA Insertion Date/Time: 07/07/2012 1:27 PM Performed by: Gar Gibbon Pre-anesthesia Checklist: Patient identified, Emergency Drugs available, Suction available and Patient being monitored Patient Re-evaluated:Patient Re-evaluated prior to inductionOxygen Delivery Method: Circle System Utilized Preoxygenation: Pre-oxygenation with 100% oxygen Intubation Type: IV induction Ventilation: Mask ventilation without difficulty LMA: LMA inserted LMA Size: 4.0 Number of attempts: 1 Airway Equipment and Method: bite block Placement Confirmation: positive ETCO2 Tube secured with: Tape Dental Injury: Teeth and Oropharynx as per pre-operative assessment

## 2012-07-07 NOTE — H&P (View-Only) (Signed)
Subjective:     Patient ID: [name], female   DOB: May 12, 1946, 67 y.o.   MRN: [id number]  HPI 35 yof s/p lumpectomy/snbx for node positive breast cancer.  I have discussed margins with pathologist and these indeed are negative due to the skin I removed.  She is doing well after surgery.  She is also her2 amplified.  She comes in today without any significant complaints.   Review of Systems     Objective:   Physical Exam Healing incisions without infection axilla and breast    Assessment:     Stage II left breast cancer    Plan:     She is doing well s/p surgery. She has chemo planned to begin 12 February.  We discussed port placement and I showed her the device and where it would go.  We discussed risks including bleeding, infection, pneumothorax.  Will proceed asap.

## 2012-07-07 NOTE — Op Note (Signed)
Preoperative diagnosis: stage II node positive left breast cancer s/p lump/sn Postoperative diagnosis: SAA Procedure: Left subclavian power port insertion Surgeon: Dr. Harden Mo Anesthesia: Gen. With LMA Estimated blood loss: Minimal Drains: None Complications: Unable to access the Right subclavian vein Disposition to recovery stable Sponge count correct at end of operation  Indications: This is a 67 year old female who underwent a lumpectomy and sentinel node biopsy for node positive stage II breast cancer. She's been evaluated and we decided not to proceed with a axillary lymph node dissection. She's going to receive chemotherapy and radiation therapy. Her margins were also initially listed as positive. I talked to the pathologist as I excised a paddle of skin overlying the tumor and due to this factor margins are all negative. I discussed this with the pathologist as well. She and I discussed a port placement to begin chemotherapy.  Procedure: After informed consent was obtained the patient was taken to the operating room. She had sequential compression devices placed. She was administered 2 g of intravenous cefazolin. She was then placed under general anesthesia with an LMA. Her arms were tucked and appropriately padded. Her bilateral chest were then prepped and draped in the standard sterile surgical fashion. A surgical timeout was performed.  I tried to put the port on the right side. I anesthetized this area with quarter percent Marcaine. I attempted several times to access her vein on this side but was unable to do so. Due to my concern for continued attempts at access on this side I then turned to the left side. I accessed her left subclavian vein on the first pass. I passed the wire. This was confirmed to be in position by fluoroscopy. I then made a pocket below this overlying the pectoralis muscle. I then tunneled a line between the 2 sites. I dilated up the tract. I then placed the  dilator and peel-away sheath assembly. I watched this under fluoroscopy going to the cava. I then removed the wire and the dilator. I then passed the line through the peel-away sheath. The peel-away sheath was removed. I then pulled the line back to be in the vena cava. I then attached the port. I sutured this into position with 2-0 Prolene in 2 positions. The port flushed easily and aspirated blood. I placed concentrated heparin into the port. I then closed this with 3-0 Vicryl for Monocryl and Dermabond. Final fluoroscopy showed the port to be in good position with no kinks. I did not see a pneumothorax on either side on fluoroscopy at completion. She was extubated and transferred to recovery stable.

## 2012-07-08 ENCOUNTER — Encounter (HOSPITAL_COMMUNITY): Payer: Self-pay | Admitting: *Deleted

## 2012-07-08 ENCOUNTER — Telehealth (INDEPENDENT_AMBULATORY_CARE_PROVIDER_SITE_OTHER): Payer: Self-pay | Admitting: General Surgery

## 2012-07-08 ENCOUNTER — Emergency Department (HOSPITAL_COMMUNITY): Payer: BC Managed Care – PPO

## 2012-07-08 ENCOUNTER — Other Ambulatory Visit: Payer: BC Managed Care – PPO

## 2012-07-08 ENCOUNTER — Observation Stay (HOSPITAL_COMMUNITY)
Admission: EM | Admit: 2012-07-08 | Discharge: 2012-07-09 | Disposition: A | Discharge status: Home / Self Care | Payer: BC Managed Care – PPO | Attending: General Surgery | Admitting: General Surgery

## 2012-07-08 DIAGNOSIS — F329 Major depressive disorder, single episode, unspecified: Secondary | ICD-10-CM | POA: Insufficient documentation

## 2012-07-08 DIAGNOSIS — J9383 Other pneumothorax: Principal | ICD-10-CM | POA: Insufficient documentation

## 2012-07-08 DIAGNOSIS — F3289 Other specified depressive episodes: Secondary | ICD-10-CM | POA: Insufficient documentation

## 2012-07-08 DIAGNOSIS — R079 Chest pain, unspecified: Secondary | ICD-10-CM | POA: Diagnosis present

## 2012-07-08 DIAGNOSIS — E785 Hyperlipidemia, unspecified: Secondary | ICD-10-CM | POA: Insufficient documentation

## 2012-07-08 DIAGNOSIS — R0602 Shortness of breath: Secondary | ICD-10-CM | POA: Insufficient documentation

## 2012-07-08 DIAGNOSIS — I10 Essential (primary) hypertension: Secondary | ICD-10-CM | POA: Insufficient documentation

## 2012-07-08 DIAGNOSIS — Z853 Personal history of malignant neoplasm of breast: Secondary | ICD-10-CM | POA: Insufficient documentation

## 2012-07-08 DIAGNOSIS — J939 Pneumothorax, unspecified: Secondary | ICD-10-CM | POA: Diagnosis present

## 2012-07-08 HISTORY — DX: Gastro-esophageal reflux disease without esophagitis: K21.9

## 2012-07-08 LAB — CBC WITH DIFFERENTIAL/PLATELET
Basophils Absolute: 0 10*3/uL (ref 0.0–0.1)
Eosinophils Absolute: 0 10*3/uL (ref 0.0–0.7)
Eosinophils Relative: 0 % (ref 0–5)
Lymphocytes Relative: 21 % (ref 12–46)
MCH: 30.4 pg (ref 26.0–34.0)
MCV: 89.9 fL (ref 78.0–100.0)
Neutrophils Relative %: 72 % (ref 43–77)
Platelets: 228 10*3/uL (ref 150–400)
RBC: 3.68 MIL/uL — ABNORMAL LOW (ref 3.87–5.11)
RDW: 12.9 % (ref 11.5–15.5)
WBC: 13.7 10*3/uL — ABNORMAL HIGH (ref 4.0–10.5)

## 2012-07-08 LAB — APTT: aPTT: 26 seconds (ref 24–37)

## 2012-07-08 LAB — POCT I-STAT, CHEM 8
Calcium, Ion: 1.24 mmol/L (ref 1.13–1.30)
Creatinine, Ser: 1.3 mg/dL — ABNORMAL HIGH (ref 0.50–1.10)
Glucose, Bld: 106 mg/dL — ABNORMAL HIGH (ref 70–99)
HCT: 34 % — ABNORMAL LOW (ref 36.0–46.0)
Hemoglobin: 11.6 g/dL — ABNORMAL LOW (ref 12.0–15.0)
TCO2: 26 mmol/L (ref 0–100)

## 2012-07-08 LAB — PROTIME-INR: Prothrombin Time: 12.3 seconds (ref 11.6–15.2)

## 2012-07-08 MED ORDER — ACETAMINOPHEN 325 MG PO TABS: 650.0000 mg | ORAL_TABLET | Freq: Four times a day (QID) | ORAL | Status: DC | PRN

## 2012-07-08 MED ORDER — SODIUM CHLORIDE 0.9 % IV BOLUS (SEPSIS)
500.0000 mL | Freq: Once | INTRAVENOUS | Status: AC
  Administered 2012-07-08: 500 mL via INTRAVENOUS

## 2012-07-08 MED ORDER — MORPHINE SULFATE 2 MG/ML IJ SOLN
1.0000 mg | INTRAMUSCULAR | Status: DC | PRN
  Administered 2012-07-08 (×2): 1 mg via INTRAVENOUS
  Filled 2012-07-08 (×2): qty 1

## 2012-07-08 MED ORDER — ATORVASTATIN CALCIUM 40 MG PO TABS
40.0000 mg | ORAL_TABLET | Freq: Every day | ORAL | Status: DC
  Filled 2012-07-08: qty 1

## 2012-07-08 MED ORDER — SODIUM CHLORIDE 0.9 % IV SOLN
Freq: Once | INTRAVENOUS | Status: AC
  Administered 2012-07-08: 75 mL/h via INTRAVENOUS

## 2012-07-08 MED ORDER — ACETAMINOPHEN 650 MG RE SUPP: 650.0000 mg | Freq: Four times a day (QID) | RECTAL | Status: DC | PRN

## 2012-07-08 MED ORDER — POLYETHYLENE GLYCOL 3350 17 G PO PACK
17.0000 g | PACK | Freq: Every day | ORAL | Status: DC | PRN
  Filled 2012-07-08: qty 1

## 2012-07-08 MED ORDER — CITALOPRAM HYDROBROMIDE 10 MG PO TABS
10.0000 mg | ORAL_TABLET | Freq: Every day | ORAL | Status: DC
  Administered 2012-07-09: 10 mg via ORAL
  Filled 2012-07-08: qty 1

## 2012-07-08 MED ORDER — HYDROCHLOROTHIAZIDE 12.5 MG PO CAPS
12.5000 mg | ORAL_CAPSULE | Freq: Every day | ORAL | Status: DC
  Administered 2012-07-09: 12.5 mg via ORAL
  Filled 2012-07-08: qty 1

## 2012-07-08 MED ORDER — ATENOLOL 50 MG PO TABS
50.0000 mg | ORAL_TABLET | Freq: Every day | ORAL | Status: DC
  Administered 2012-07-09: 50 mg via ORAL
  Filled 2012-07-08: qty 1

## 2012-07-08 MED ORDER — IRBESARTAN 300 MG PO TABS
300.0000 mg | ORAL_TABLET | Freq: Every day | ORAL | Status: DC
  Administered 2012-07-09: 300 mg via ORAL
  Filled 2012-07-08: qty 1

## 2012-07-08 MED ORDER — OLMESARTAN MEDOXOMIL-HCTZ 40-12.5 MG PO TABS: 1.0000 | ORAL_TABLET | Freq: Every day | ORAL | Status: DC

## 2012-07-08 MED ORDER — ONDANSETRON HCL 4 MG/2ML IJ SOLN: 4.0000 mg | Freq: Four times a day (QID) | INTRAMUSCULAR | Status: DC | PRN

## 2012-07-08 MED ORDER — PANTOPRAZOLE SODIUM 40 MG PO TBEC
40.0000 mg | DELAYED_RELEASE_TABLET | Freq: Every day | ORAL | Status: DC
  Administered 2012-07-08 – 2012-07-09 (×2): 40 mg via ORAL
  Filled 2012-07-08 (×2): qty 1

## 2012-07-08 MED ORDER — DIPHENHYDRAMINE HCL 12.5 MG/5ML PO ELIX
12.5000 mg | ORAL_SOLUTION | Freq: Four times a day (QID) | ORAL | Status: DC | PRN
  Filled 2012-07-08: qty 5

## 2012-07-08 MED ORDER — DIPHENHYDRAMINE HCL 50 MG/ML IJ SOLN: 12.5000 mg | Freq: Four times a day (QID) | INTRAMUSCULAR | Status: DC | PRN

## 2012-07-08 MED ORDER — IOHEXOL 350 MG/ML SOLN
100.0000 mL | Freq: Once | INTRAVENOUS | Status: AC | PRN
  Administered 2012-07-08: 100 mL via INTRAVENOUS

## 2012-07-08 MED ORDER — MORPHINE SULFATE 2 MG/ML IJ SOLN
INTRAMUSCULAR | Status: AC
  Filled 2012-07-08: qty 1

## 2012-07-08 MED ORDER — ENOXAPARIN SODIUM 40 MG/0.4ML ~~LOC~~ SOLN
40.0000 mg | SUBCUTANEOUS | Status: DC
  Administered 2012-07-08: 40 mg via SUBCUTANEOUS
  Filled 2012-07-08 (×2): qty 0.4

## 2012-07-08 MED ORDER — KETOROLAC TROMETHAMINE 30 MG/ML IJ SOLN
30.0000 mg | Freq: Once | INTRAMUSCULAR | Status: AC
  Administered 2012-07-08: 30 mg via INTRAVENOUS
  Filled 2012-07-08: qty 1

## 2012-07-08 MED ORDER — POTASSIUM CHLORIDE IN NACL 20-0.9 MEQ/L-% IV SOLN
INTRAVENOUS | Status: DC
  Administered 2012-07-08: 20 mL/h via INTRAVENOUS
  Filled 2012-07-08: qty 1000

## 2012-07-08 MED ORDER — OXYCODONE-ACETAMINOPHEN 5-325 MG PO TABS
1.0000 | ORAL_TABLET | ORAL | Status: DC | PRN
  Administered 2012-07-09 (×2): 2 via ORAL
  Filled 2012-07-08 (×2): qty 2

## 2012-07-08 MED ORDER — DOCUSATE SODIUM 100 MG PO CAPS: 100.0000 mg | ORAL_CAPSULE | Freq: Two times a day (BID) | ORAL | Status: DC | PRN

## 2012-07-08 NOTE — ED Notes (Signed)
Pt in via [facility] from work, pt c/o L sided chest described as agonizing pain that radiates under L breast, pt c/o SOB with deep inspiration, pt reports pain worsening with deep inspiration, pt had port placed yesterday, pt hx of breast CA with lumpectomy in L breast, pt A&O x 4, follows commands, speaks in complete sentences, pt received 324 ASA & x1 sL nitro prior to arrival without relief

## 2012-07-08 NOTE — H&P (Signed)
[name] is an 67 y.o. female.   Consulting MD:  Dr. Eber Hong Chief Complaint: L chest pain and SOB HPI:  67 year old female who presents with the complaint of acute onset of left-sided chest pain that is sharp and stabbing in nature, awoke her from sleep at 3:00 this morning and has been persistent since that time. She has associated mild shortness of breath and the pain gets worse when she takes a deep breath. She has no fevers, no swelling, no cough, no back pain, no symptoms in her arm or her jaw. The patient was recently diagnosed with breast cancer on the left, she had a lumpectomy one month ago with 2 lymph nodes which were resected in her axilla. She had a port placed in her left upper chest yesterday to prepare for chemotherapy. She had no symptoms after the procedure and was able to fall asleep without any difficulty last night. She has no history of cardiac disease though she does have a heart murmur that she has had for approximately 20 years. Paramedics found the patient at work, her coworkers encouraged her to call for transport due to ongoing pain. She works at a rehab facility.  She was given 325 mg of aspirin and one nitroglycerin which did not help her pain.  She was transported to [facility] for further evaluation.  We were asked to see her by the EDP Dr. Hyacinth Meeker, but were also made aware of her arrival by [facility] office.  Workup showed a small left apical pneumothorax on CT which was not seen on CXR in the ER or post-operative CXR.Marland Kitchen  Creatinine at 1.3 and slight anemia.   Past Medical History  Diagnosis Date  . Hyperlipidemia   . Invasive ductal carcinoma of breast 2013    Left  . Arthritis   . Depression   . Heart murmur     heard one when she was pregnamt-maybe had echo-cannot remember  . Hypertension     Past Surgical History  Procedure Date  . Tubal ligation 1988  . Bladder suspension 10/2010  . Tonsillectomy and adenoidectomy     age 67  . Abdominal adhesion surgery  1980  . Benign breast biopsy 1994    Left  . Breast lumpectomy with needle localization and axillary sentinel lymph node bx 06/10/2012    Procedure: BREAST LUMPECTOMY WITH NEEDLE LOCALIZATION AND AXILLARY SENTINEL LYMPH NODE BX;  Surgeon: Emelia Loron, MD;  Location: [facility];  Service: General;  Laterality: Left;  . Appendectomy   . Cholecystectomy   . Portacath placement 07/07/2012    Procedure: INSERTION PORT-A-CATH;  Surgeon: Emelia Loron, MD;  Location: [facility];  Service: General;  Laterality: Left;  Port a cath insertion    Family History  Problem Relation Age of Onset  . Colon cancer Mother 41  . Breast cancer Sister 29  . Prostate cancer Father 54  . Stomach cancer Maternal Grandmother 30   Social History:  reports that she has never smoked. She has never used smokeless tobacco. She reports that she drinks about .6 ounces of alcohol per week. She reports that she does not use illicit drugs.  Allergies: No Known Allergies   (Not in a hospital admission)  Results for orders placed during the hospital encounter of 07/08/12 (from the past 48 hour(s))  CBC WITH DIFFERENTIAL     Status: Abnormal   Collection Time   07/08/12 11:34 AM      Component Value Range Comment  WBC 13.7 (*) 4.0 - 10.5 K/uL    RBC 3.68 (*) 3.87 - 5.11 MIL/uL    Hemoglobin 11.2 (*) 12.0 - 15.0 g/dL    HCT 78.2 (*) 95.6 - 46.0 %    MCV 89.9  78.0 - 100.0 fL    MCH 30.4  26.0 - 34.0 pg    MCHC 33.8  30.0 - 36.0 g/dL    RDW 21.3  08.6 - 57.8 %    Platelets 228  150 - 400 K/uL    Neutrophils Relative 72  43 - 77 %    Neutro Abs 9.8 (*) 1.7 - 7.7 K/uL    Lymphocytes Relative 21  12 - 46 %    Lymphs Abs 2.8  0.7 - 4.0 K/uL    Monocytes Relative 7  3 - 12 %    Monocytes Absolute 1.0  0.1 - 1.0 K/uL    Eosinophils Relative 0  0 - 5 %    Eosinophils Absolute 0.0  0.0 - 0.7 K/uL    Basophils Relative 0  0 - 1 %    Basophils Absolute 0.0  0.0 - 0.1 K/uL   APTT      Status: Normal   Collection Time   07/08/12 11:34 AM      Component Value Range Comment   aPTT 26  24 - 37 seconds   PROTIME-INR     Status: Normal   Collection Time   07/08/12 11:34 AM      Component Value Range Comment   Prothrombin Time 12.3  11.6 - 15.2 seconds    INR 0.92  0.00 - 1.49   POCT I-STAT TROPONIN I     Status: Normal   Collection Time   07/08/12 12:09 PM      Component Value Range Comment   Troponin i, poc 0.00  0.00 - 0.08 ng/mL    Comment 3            POCT I-STAT, CHEM 8     Status: Abnormal   Collection Time   07/08/12 12:11 PM      Component Value Range Comment   Sodium 140  135 - 145 mEq/L    Potassium 4.5  3.5 - 5.1 mEq/L    Chloride 107  96 - 112 mEq/L    BUN 27 (*) 6 - 23 mg/dL    Creatinine, Ser 4.69 (*) 0.50 - 1.10 mg/dL    Glucose, Bld 629 (*) 70 - 99 mg/dL    Calcium, Ion 5.28  4.13 - 1.30 mmol/L    TCO2 26  0 - 100 mmol/L    Hemoglobin 11.6 (*) 12.0 - 15.0 g/dL    HCT 24.4 (*) 01.0 - 46.0 %    Ct Angio Chest Pe W/cm &/or Wo Cm  07/08/2012  *RADIOLOGY REPORT*  Clinical Data: Personal history of breast carcinoma, recent chest pain and shortness of breath  CT ANGIOGRAPHY CHEST  Technique:  Multidetector CT imaging of the chest using the standard protocol during bolus administration of intravenous contrast. Multiplanar reconstructed images including MIPs were obtained and reviewed to evaluate the vascular anatomy.  Contrast: OMNIPAQUE IOHEXOL 350 MG/ML SOLN  Comparison: None.  Findings: The right lung is well aerated without evidence of focal infiltrate or sizable effusion.  There is a mild pneumothorax noted on the left involving predominately the upper lobe.  There is noted within the subcutaneous tissues surrounding a left chest wall port and this pneumothorax is likely related to the recent  port placement.  This was not well visualized on the prior post placement film.  The pulmonary artery is well visualized bilaterally demonstrates normal branching  pattern.  No filling defects to suggest pulmonary emboli are seen.  Visualized upper abdomen is unremarkable.  The osseous structures are grossly unremarkable as well.  IMPRESSION: Small left-sided pneumothorax likely related to the recent port placement.  This was not as well visualized on the previous post port placement film.  No evidence of pulmonary embolism.  No other focal abnormality is seen.   Original Report Authenticated By: Alcide Clever, M.D.    Dg Chest Port 1 View  07/08/2012  *RADIOLOGY REPORT*  Clinical Data: Left-sided chest pain with inspiration.  Port-A-Cath inserted yesterday.  PORTABLE CHEST - 1 VIEW  Comparison: 1 day prior  Findings: Left-sided Port-A-Cath terminates at the mid SVC, unchanged. No pneumothorax.  Heart size remains upper normal, minimal atelectasis at the left lung base.  IMPRESSION: Similar position of left-sided Port-A-Cath, without pneumothorax or acute finding.   Original Report Authenticated By: Jeronimo Greaves, M.D.    Dg Chest Port 1 View  07/07/2012  *RADIOLOGY REPORT*  Clinical Data: Port-A-Cath placement.  PORTABLE CHEST - 1 VIEW  Comparison: 02/25/2011  Findings: 1506 hours.  Lung volumes are low.  New left-sided Port-A- Cath noted with tip overlying the mid SVC.  No evidence for pneumothorax.  Lungs are clear. The cardiopericardial silhouette is enlarged.  IMPRESSION: New left Port-A-Cath tip overlies the mid SVC level.   Original Report Authenticated By: Kennith Center, M.D.    Dg Fluoro Guide Cv Line-no Report  07/07/2012  CLINICAL DATA: Port a Cath Placement - [facility] OR 6   FLOURO GUIDE CV LINE  Fluoroscopy was utilized by the requesting physician.  No radiographic  interpretation.      Review of Systems  Respiratory: Positive for shortness of breath. Negative for cough, hemoptysis, sputum production and wheezing.   Cardiovascular: Positive for chest pain (Left chest). Negative for palpitations, leg swelling and PND.  Gastrointestinal: Negative for heartburn,  nausea, vomiting, abdominal pain and diarrhea.  Genitourinary: Negative for dysuria, urgency and frequency.  Musculoskeletal: Negative for myalgias.  Neurological: Negative for dizziness and weakness.    Blood pressure 138/50, pulse 60, temperature 97.9 F (36.6 C), temperature source Oral, resp. rate 19, SpO2 100.00%. Physical Exam  Constitutional: She is oriented to person, place, and time. She appears well-developed and well-nourished.  HENT:  Head: Normocephalic and atraumatic.  Eyes: Conjunctivae normal and EOM are normal. Right eye exhibits no discharge. Left eye exhibits no discharge.  Neck: Normal range of motion.  Cardiovascular: Normal rate and regular rhythm.  Exam reveals no gallop and no friction rub.   Murmur heard. Respiratory: Effort normal and breath sounds normal. No respiratory distress. She has no wheezes. She has no rales. She exhibits tenderness (around porta-cath site).  GI: Soft. Bowel sounds are normal. She exhibits no distension and no mass. There is no tenderness. There is no rebound and no guarding.  Musculoskeletal: Normal range of motion. She exhibits no edema and no tenderness.  Neurological: She is alert and oriented to person, place, and time.  Skin: Skin is warm and dry. No lesion noted. No erythema.  Psychiatric: She has a normal mood and affect. Her behavior is normal. Judgment and thought content normal.     Assessment/Plan Small left apical pneumothorax 1.  Admit for observation 2.  Admit to medsurg floor with Tele if possible 3.  Repeat CXR and BMET in  AM 4.  IVF KVO, pain mgt, IS, ambulation, reg diet 5.  DVT prophylaxis - lovenox, SCD's 6.  If pneumo is stable tomorrow can likely be discharged, if larger may need a chest tube  DORT, Emiko Osorto 07/08/2012, 4:20 PM

## 2012-07-08 NOTE — ED Notes (Signed)
Stand by assistance with ambulating pt to bathroom

## 2012-07-08 NOTE — Telephone Encounter (Signed)
Patient called in with chest pain and shortness of breath that started when she woke up this am and is not subsiding. She had a PAC placement yesterday by Dr Dwain Sarna. She is having no other symptoms. I spoke with Dr Donell Beers who recommended [facility] ER. I instructed patient to head to [facility] ER. Meagen, PA, paged to make her aware of patient going to the ER. She called back and was made aware. Charge nurse at [facility] ER made aware of patient heading to the ER.

## 2012-07-08 NOTE — ED Provider Notes (Signed)
History     CSN: [id number]  Arrival date & time 07/08/12  1119   First MD Initiated Contact with Patient 07/08/12 1125      Chief Complaint  Patient presents with  . Chest Pain    (Consider location/radiation/quality/duration/timing/severity/associated sxs/prior treatment) HPI Comments: 67 year old female who presents with the complaint of acute onset of left-sided chest pain that is sharp and stabbing in nature, awoke her from sleep at 3:00 this morning and has been persistent since that time. She has associated mild shortness of breath and the pain gets worse when she takes a deep breath. She has no fevers, no swelling, no cough, no back pain, no symptoms in her arm or her jaw. The patient was recently diagnosed with breast cancer on the left, she had a lumpectomy one month ago with 2 lymph nodes which were resected in her axilla. She had a port placed in her left upper chest yesterday to prepare for chemotherapy. She had no symptoms after the procedure and was able to fall asleep without any difficulty last night. She has no history of cardiac disease though she does have a heart murmur that she is not allowed for approximately 20 years. Paramedics found the patient at work, her coworkers encouraged her to call for transport due to ongoing pain. She was given 325 mg of aspirin and one nitroglycerin which did not help her pain.  Patient is a 67 y.o. female presenting with chest pain. The history is provided by the patient, medical records and the EMS personnel.  Chest Pain     Past Medical History  Diagnosis Date  . Hyperlipidemia   . Invasive ductal carcinoma of breast 2013    Left  . Arthritis   . Depression   . Heart murmur     heard one when she was pregnamt-maybe had echo-cannot remember  . Hypertension     Past Surgical History  Procedure Date  . Tubal ligation 1988  . Bladder suspension 10/2010  . Tonsillectomy and adenoidectomy     age 36  . Abdominal adhesion  surgery 1980  . Benign breast biopsy 1994    Left  . Breast lumpectomy with needle localization and axillary sentinel lymph node bx 06/10/2012    Procedure: BREAST LUMPECTOMY WITH NEEDLE LOCALIZATION AND AXILLARY SENTINEL LYMPH NODE BX;  Surgeon: Emelia Loron, MD;  Location: [facility];  Service: General;  Laterality: Left;  . Appendectomy   . Cholecystectomy     Family History  Problem Relation Age of Onset  . Colon cancer Mother 83  . Breast cancer Sister 23  . Prostate cancer Father 68  . Stomach cancer Maternal Grandmother 86    History  Substance Use Topics  . Smoking status: Never Smoker   . Smokeless tobacco: Never Used  . Alcohol Use: 0.6 oz/week    1 Glasses of wine per week     Comment: 1 glass of Wine per Week    OB History    Grav Para Term Preterm Abortions TAB SAB Ect Mult Living                  Review of Systems  Cardiovascular: Positive for chest pain.  All other systems reviewed and are negative.    Allergies  Review of patient's allergies indicates no known allergies.  Home Medications   Current Outpatient Rx  Name  Route  Sig  Dispense  Refill  . VITAMIN C 1000 MG PO TABS   Oral  Take 1,000 mg by mouth daily.           . ASPIRIN 325 MG PO TABS   Oral   Take 325 mg by mouth daily.           . ATENOLOL 50 MG PO TABS   Oral   Take 1 tablet by mouth daily.          Marland Kitchen VITAMIN D 2000 UNITS PO CAPS   Oral   Take 1 capsule by mouth daily.           Marland Kitchen CITALOPRAM HYDROBROMIDE 10 MG PO TABS   Oral   Take 10 mg by mouth daily.          Marland Kitchen OLMESARTAN MEDOXOMIL-HCTZ 40-12.5 MG PO TABS   Oral   Take 1 tablet by mouth daily.         Marland Kitchen OMEPRAZOLE 40 MG PO CPDR   Oral   Take 1 tablet by mouth Daily.         Marland Kitchen SIMVASTATIN 80 MG PO TABS   Oral   Take 40 mg by mouth at bedtime.          Marland Kitchen VITAMIN E 400 UNITS PO CAPS   Oral   Take 400 Units by mouth daily.             BP 138/50  Pulse 60  Temp 97.9  F (36.6 C) (Oral)  Resp 19  SpO2 100%  Physical Exam  Nursing note and vitals reviewed. Constitutional: She appears well-developed and well-nourished. No distress.  HENT:  Head: Normocephalic and atraumatic.  Mouth/Throat: Oropharynx is clear and moist. No oropharyngeal exudate.  Eyes: Conjunctivae normal and EOM are normal. Pupils are equal, round, and reactive to light. Right eye exhibits no discharge. Left eye exhibits no discharge. No scleral icterus.  Neck: Normal range of motion. Neck supple. No JVD present. No thyromegaly present.  Cardiovascular: Normal rate, regular rhythm and intact distal pulses.  Exam reveals no gallop and no friction rub.   Murmur ( Systolic murmur) heard.      No JVD, strong pulses at the radial arteries bilaterally  Pulmonary/Chest: Effort normal and breath sounds normal. No respiratory distress. She has no wheezes. She has no rales. She exhibits tenderness ( Minimal chest tenderness surrounding the port in the left upper chest, no redness or erythema).  Abdominal: Soft. Bowel sounds are normal. She exhibits no distension and no mass. There is no tenderness.  Musculoskeletal: Normal range of motion. She exhibits no edema and no tenderness.       No lower extremity edema or asymmetry  Lymphadenopathy:    She has no cervical adenopathy.  Neurological: She is alert. Coordination normal.  Skin: Skin is warm and dry. No rash noted. No erythema.  Psychiatric: She has a normal mood and affect. Her behavior is normal.    ED Course  Procedures (including critical care time)  Labs Reviewed  CBC WITH DIFFERENTIAL - Abnormal; Notable for the following:    WBC 13.7 (*)     RBC 3.68 (*)     Hemoglobin 11.2 (*)     HCT 33.1 (*)     Neutro Abs 9.8 (*)     All other components within normal limits  POCT I-STAT, CHEM 8 - Abnormal; Notable for the following:    BUN 27 (*)     Creatinine, Ser 1.30 (*)     Glucose, Bld 106 (*)     Hemoglobin 11.6 (*)  HCT  34.0 (*)     All other components within normal limits  APTT  PROTIME-INR  POCT I-STAT TROPONIN I   Ct Angio Chest Pe W/cm &/or Wo Cm  07/08/2012  *RADIOLOGY REPORT*  Clinical Data: Personal history of breast carcinoma, recent chest pain and shortness of breath  CT ANGIOGRAPHY CHEST  Technique:  Multidetector CT imaging of the chest using the standard protocol during bolus administration of intravenous contrast. Multiplanar reconstructed images including MIPs were obtained and reviewed to evaluate the vascular anatomy.  Contrast: OMNIPAQUE IOHEXOL 350 MG/ML SOLN  Comparison: None.  Findings: The right lung is well aerated without evidence of focal infiltrate or sizable effusion.  There is a mild pneumothorax noted on the left involving predominately the upper lobe.  There is noted within the subcutaneous tissues surrounding a left chest wall port and this pneumothorax is likely related to the recent port placement.  This was not well visualized on the prior post placement film.  The pulmonary artery is well visualized bilaterally demonstrates normal branching pattern.  No filling defects to suggest pulmonary emboli are seen.  Visualized upper abdomen is unremarkable.  The osseous structures are grossly unremarkable as well.  IMPRESSION: Small left-sided pneumothorax likely related to the recent port placement.  This was not as well visualized on the previous post port placement film.  No evidence of pulmonary embolism.  No other focal abnormality is seen.   Original Report Authenticated By: Alcide Clever, M.D.    Dg Chest Port 1 View  07/08/2012  *RADIOLOGY REPORT*  Clinical Data: Left-sided chest pain with inspiration.  Port-A-Cath inserted yesterday.  PORTABLE CHEST - 1 VIEW  Comparison: 1 day prior  Findings: Left-sided Port-A-Cath terminates at the mid SVC, unchanged. No pneumothorax.  Heart size remains upper normal, minimal atelectasis at the left lung base.  IMPRESSION: Similar position of  left-sided Port-A-Cath, without pneumothorax or acute finding.   Original Report Authenticated By: Jeronimo Greaves, M.D.    Dg Chest Port 1 View  07/07/2012  *RADIOLOGY REPORT*  Clinical Data: Port-A-Cath placement.  PORTABLE CHEST - 1 VIEW  Comparison: 02/25/2011  Findings: 1506 hours.  Lung volumes are low.  New left-sided Port-A- Cath noted with tip overlying the mid SVC.  No evidence for pneumothorax.  Lungs are clear. The cardiopericardial silhouette is enlarged.  IMPRESSION: New left Port-A-Cath tip overlies the mid SVC level.   Original Report Authenticated By: Kennith Center, M.D.    Dg Fluoro Guide Cv Line-no Report  07/07/2012  CLINICAL DATA: Port a Cath Placement - [facility] OR 6   FLOURO GUIDE CV LINE  Fluoroscopy was utilized by the requesting physician.  No radiographic  interpretation.       1. Pneumothorax on left       MDM  At this time the patient appears to be in no acute distress, she has an exam consistent with a systolic murmur which she states she knows about. She will need workup for a pneumothorax status post port placement in the left chest but also will need a CT scan to evaluate for pulmonary embolism. She is not tachycardic, she is not hypoxic, she will be given Toradol for pain. I doubt an ischemic etiology of this chest pain given the onset and persistence of her symptoms with a normal EKG. Troponin ordered  ED ECG REPORT  I personally interpreted this EKG   Date: 07/08/2012   Rate: 53  Rhythm: sinus bradycardia  QRS Axis: normal  Intervals: normal  ST/T Wave abnormalities: normal  Conduction Disutrbances:none  Narrative Interpretation: PRWP  Old EKG Reviewed: Compared with 06/08/2012, no significant changes   Discussed with general surgeon Dr. Dwain Sarna who will admit the patient for observation. I personally reviewed the CT scan which shows a very small anterior apical left-sided pneumothorax. The patient is hemodynamically stable, she has no difficulty breathing  oxygen saturations which are 100% on 2 L.   Vida Roller, MD 07/08/12 [phone]

## 2012-07-08 NOTE — H&P (Signed)
Patient examined and I agree with the assessment and plan.  Will admit for observation and check F/U CXR in the AM. I also showed the patient her radiology findings and discussed the plan of care with her and her family member.  I answered their questions.  Violeta Gelinas, MD, MPH, FACS Pager: [phone]  07/08/2012 4:39 PM

## 2012-07-09 ENCOUNTER — Observation Stay (HOSPITAL_COMMUNITY): Payer: BC Managed Care – PPO

## 2012-07-09 LAB — BASIC METABOLIC PANEL
CO2: 26 mEq/L (ref 19–32)
Calcium: 8.8 mg/dL (ref 8.4–10.5)
Sodium: 139 mEq/L (ref 135–145)

## 2012-07-09 MED ORDER — OXYCODONE-ACETAMINOPHEN 5-325 MG PO TABS: 1.0000 | ORAL_TABLET | Freq: Four times a day (QID) | ORAL | Status: DC | PRN

## 2012-07-09 NOTE — Progress Notes (Addendum)
  Subjective: Doing fine, no real complaints, breathing easily  Objective: Vital signs in last 24 hours: Temp:  [97.8 F (36.6 C)-98.5 F (36.9 C)] 98.4 F (36.9 C) (02/07 0506) Pulse Rate:  [52-70] 67  (02/07 0506) Resp:  [14-22] 16  (02/07 0506) BP: (91-138)/(43-61) 100/57 mmHg (02/07 0506) SpO2:  [94 %-100 %] 97 % (02/07 0506) Weight:  [159 lb (72.122 kg)] 159 lb (72.122 kg) (02/06 1914) Last BM Date: 07/07/12  Intake/Output from previous day: 02/06 0701 - 02/07 0700 In: 255.3 [P.O.:100; I.V.:155.3] Out: -  Intake/Output this shift:    General appearance: no distress Resp: clear to auscultation bilaterally Chest wall: no tenderness, port in place with no infection, mildly tender at site  Lab Results:   Basename 07/08/12 1211 07/08/12 1134  WBC -- 13.7*  HGB 11.6* 11.2*  HCT 34.0* 33.1*  PLT -- 228   BMET  Basename 07/09/12 0535 07/08/12 1211  NA 139 140  K 4.2 4.5  CL 106 107  CO2 26 --  GLUCOSE 94 106*  BUN 21 27*  CREATININE 1.13* 1.30*  CALCIUM 8.8 --   PT/INR  Basename 07/08/12 1134  LABPROT 12.3  INR 0.92    Studies/Results:  Assessment/Plan: PTX s/p port She is asx this am.  Will check 2 view cxr.  If ok will dc home   CHEST - 2 VIEW  Comparison: 07/08/2012; 07/07/2012; chest CT - 07/08/2012  Findings:  Grossly unchanged borderline enlarged cardiac silhouette.  Unchanged mediastinal contours. Stable positioning of support  apparatus. Unchanged tiny left apical pneumothorax. Unchanged  trace left-sided pleural effusion. There is chronic blunting of  the right costophrenic angle without definite pleural effusion.  Mild pulmonary congestion without frank evidence of edema. Minimal  left basilar opacity favored to represent atelectasis. No new  focal airspace opacities. Unchanged bones. Post cholecystectomy.  Surgical clips overlie the left breast.  IMPRESSION:  1. Unchanged tiny left apical pneumothorax.  2. Unchanged trace left sided  pleural effusion and left basilar  opacities favored to represent atelectasis.   I think this is stable and she is asx right now.  This should resolve on its own.  Warned her about symptoms to call about.  Will dc today.  [facility] 07/09/2012

## 2012-07-10 ENCOUNTER — Encounter: Payer: Self-pay | Admitting: *Deleted

## 2012-07-10 NOTE — Progress Notes (Signed)
Mailed after appt letter to pt. 

## 2012-07-12 NOTE — Discharge Summary (Signed)
Physician Discharge Summary  Patient ID: [name] MRN: [id number] DOB/AGE: [birth year] 67 y.o.  Admit date: 07/08/2012 Discharge date: 07/12/2012  Admission Diagnoses: Pneumothorax s/p port placement Breast cancer  Discharge Diagnoses:  Active Problems:   Pneumothorax, left   SOB (shortness of breath)   Chest pain saa  Discharged Condition: good  Hospital Course: This is a 67 year old female with left breast cancer that I treated with a lumpectomy and sentinel node biopsy. She is due to begin chemotherapy. I placed a port the day prior to admission. She had the acute onset of shortness of breath. On evaluation emergency room showed a fairly normal portable chest x-ray but on CT scan she had a left-sided anterior pneumothorax. She was doing well clinically. I decided to monitor her. The following day she was doing well clinically and had a 2 view x-ray which showed stability of this pneumothorax. We sent her home as this was not getting worse with the idea that this will resolve on its own.  Consults: None  Significant Diagnostic Studies: radiology: CT scan: left anterior ptx  Treatments: monitoring    Disposition: 01-Home or Self Care   Future Appointments Provider Department Dept Phone   07/14/2012 9:30 AM Windell Hum[facility]bird [facility] [phone]   07/14/2012 10:00 AM Victorino December, MD [facility] MEDICAL ONCOLOGY [phone]   07/14/2012 11:00 AM [facility] [place] [facility] [phone]   07/15/2012 11:00 AM [facility] Inj Nurse [place] [facility] [phone]   07/16/2012 8:30 AM Emelia Loron, MD [facility], [place] [phone]   07/21/2012 8:30 AM Dava Najjar Idelle Jo [facility] MEDICAL ONCOL[facility]Y [phone]   07/21/2012 9:00 AM [facility] [place] [facility] [phone]   07/23/2012 11:00 AM Emelia Loron, MD [facility], [place] [phone]   07/28/2012 11:15 AM Dava Najjar Idelle Jo [facility] MEDICAL ONCOL[facility]Y [phone]   07/28/2012 11:45 AM [facility] [place] [facility] [phone]   08/04/2012 10:30 AM Dava Najjar Idelle Jo [facility] MED[facility]COL[facility] [phone]   08/04/2012 11:00 AM [facility] [place] [facility] [phone]   08/05/2012 11:00 AM [facility] Inj Nurse [place] [facility] [phone]   08/11/2012 9:00 AM Mauri [place] [facility] MEDICAL ONCOLOGY [phone]   08/11/2012 9:30 AM [facility] [place] [facility] [phone]   08/18/2012 9:30 AM Mauri [place] [facility] MEDICAL ONCOL[facility]Y [phone]   08/18/2012 10:00 AM [facility] [place] [facility] ONCOL[facility] [phone]   08/26/2012 11:00 AM [facility] Inj Nurse [place] [facility] ONCOL[facility] [phone]   09/16/2012 11:00 AM [facility] Inj Nurse  [facility] MEDICAL ONCOL[facility]Y [phone]       Medication List    TAKE these medications       aspirin 325 MG tablet  Take 325 mg by mouth daily.     atenolol 50 MG tablet  Commonly known as:  TENORMIN  Take 1 tablet by mouth daily.     citalopram 10 MG tablet  Commonly known as:  CELEXA  Take 10 mg by mouth daily.     olmesartan-hydrochlorothiazide 40-12.5 MG per tablet  Commonly known as:  BENICAR HCT  Take 1 tablet by mouth daily.     omeprazole 40 MG capsule  Commonly known as:  PRILOSEC  Take 1 tablet by mouth Daily.  oxyCODONE-acetaminophen 5-325 MG per tablet  Commonly known as:  PERCOCET/ROXICET  Take 1-2 tablets by mouth every 6 (six) hours as needed.     simvastatin 80 MG tablet  Commonly known as:  ZOCOR  Take 40 mg by mouth at bedtime.     vitamin C 1000 MG tablet  Take 1,000 mg by mouth daily.     Vitamin D 2000 UNITS Caps  Take 1 capsule by  mouth daily.     vitamin E 400 UNIT capsule  Take 400 Units by mouth daily.           Follow-up Information   Follow up with [facility], MD In 1 week.   Contact information:   [address] [place] [address] [phone]       Signed: Emelia Loron 07/12/2012, 4:57 PM

## 2012-07-14 ENCOUNTER — Ambulatory Visit (HOSPITAL_BASED_OUTPATIENT_CLINIC_OR_DEPARTMENT_OTHER): Payer: BC Managed Care – PPO

## 2012-07-14 ENCOUNTER — Encounter: Payer: Self-pay | Admitting: Oncology

## 2012-07-14 ENCOUNTER — Encounter: Payer: Self-pay | Admitting: *Deleted

## 2012-07-14 ENCOUNTER — Ambulatory Visit (HOSPITAL_BASED_OUTPATIENT_CLINIC_OR_DEPARTMENT_OTHER): Payer: BC Managed Care – PPO | Admitting: Oncology

## 2012-07-14 ENCOUNTER — Other Ambulatory Visit (HOSPITAL_BASED_OUTPATIENT_CLINIC_OR_DEPARTMENT_OTHER): Payer: BC Managed Care – PPO | Admitting: Lab

## 2012-07-14 VITALS — BP 145/70 | HR 60 | Temp 98.1°F | Resp 18

## 2012-07-14 VITALS — BP 115/72 | HR 66 | Temp 98.2°F | Resp 20 | Ht 62.0 in | Wt 160.6 lb

## 2012-07-14 DIAGNOSIS — C50919 Malignant neoplasm of unspecified site of unspecified female breast: Secondary | ICD-10-CM

## 2012-07-14 DIAGNOSIS — C50219 Malignant neoplasm of upper-inner quadrant of unspecified female breast: Secondary | ICD-10-CM

## 2012-07-14 DIAGNOSIS — C50419 Malignant neoplasm of upper-outer quadrant of unspecified female breast: Secondary | ICD-10-CM

## 2012-07-14 DIAGNOSIS — Z170 Estrogen receptor positive status [ER+]: Secondary | ICD-10-CM

## 2012-07-14 DIAGNOSIS — Z5112 Encounter for antineoplastic immunotherapy: Secondary | ICD-10-CM

## 2012-07-14 LAB — COMPREHENSIVE METABOLIC PANEL (CC13)
Albumin: 3.6 g/dL (ref 3.5–5.0)
Alkaline Phosphatase: 73 U/L (ref 40–150)
BUN: 22.9 mg/dL (ref 7.0–26.0)
CO2: 25 mEq/L (ref 22–29)
Calcium: 9.8 mg/dL (ref 8.4–10.4)
Glucose: 91 mg/dl (ref 70–99)
Potassium: 4.2 mEq/L (ref 3.5–5.1)
Sodium: 139 mEq/L (ref 136–145)
Total Protein: 7 g/dL (ref 6.4–8.3)

## 2012-07-14 LAB — CBC WITH DIFFERENTIAL/PLATELET
Basophils Absolute: 0 10*3/uL (ref 0.0–0.1)
EOS%: 2.4 % (ref 0.0–7.0)
Eosinophils Absolute: 0.1 10*3/uL (ref 0.0–0.5)
HCT: 36.6 % (ref 34.8–46.6)
HGB: 12 g/dL (ref 11.6–15.9)
MCH: 29.7 pg (ref 25.1–34.0)
MCV: 90.6 fL (ref 79.5–101.0)
NEUT#: 3 10*3/uL (ref 1.5–6.5)
NEUT%: 53.1 % (ref 38.4–76.8)
lymph#: 2.1 10*3/uL (ref 0.9–3.3)

## 2012-07-14 MED ORDER — TRASTUZUMAB CHEMO INJECTION 440 MG
4.0000 mg/kg | Freq: Once | INTRAVENOUS | Status: AC
  Administered 2012-07-14: 294 mg via INTRAVENOUS
  Filled 2012-07-14: qty 14

## 2012-07-14 MED ORDER — SODIUM CHLORIDE 0.9 % IV SOLN
462.0000 mg | Freq: Once | INTRAVENOUS | Status: AC
  Administered 2012-07-14: 460 mg via INTRAVENOUS
  Filled 2012-07-14: qty 46

## 2012-07-14 MED ORDER — ACETAMINOPHEN 325 MG PO TABS
650.0000 mg | ORAL_TABLET | Freq: Once | ORAL | Status: AC
  Administered 2012-07-14: 650 mg via ORAL

## 2012-07-14 MED ORDER — SODIUM CHLORIDE 0.9 % IJ SOLN
10.0000 mL | INTRAMUSCULAR | Status: DC | PRN
  Administered 2012-07-14: 10 mL
  Filled 2012-07-14: qty 10

## 2012-07-14 MED ORDER — DIPHENHYDRAMINE HCL 25 MG PO CAPS
50.0000 mg | ORAL_CAPSULE | Freq: Once | ORAL | Status: AC
  Administered 2012-07-14: 50 mg via ORAL

## 2012-07-14 MED ORDER — DEXAMETHASONE SODIUM PHOSPHATE 4 MG/ML IJ SOLN
20.0000 mg | Freq: Once | INTRAMUSCULAR | Status: AC
  Administered 2012-07-14: 20 mg via INTRAVENOUS

## 2012-07-14 MED ORDER — SODIUM CHLORIDE 0.9 % IV SOLN
Freq: Once | INTRAVENOUS | Status: AC
  Administered 2012-07-14: 12:00:00 via INTRAVENOUS

## 2012-07-14 MED ORDER — LORAZEPAM 0.5 MG PO TABS: 0.5000 mg | ORAL_TABLET | Freq: Four times a day (QID) | ORAL | Status: DC | PRN

## 2012-07-14 MED ORDER — PROCHLORPERAZINE MALEATE 10 MG PO TABS: 10.0000 mg | ORAL_TABLET | Freq: Four times a day (QID) | ORAL | Status: DC | PRN

## 2012-07-14 MED ORDER — HEPARIN SOD (PORK) LOCK FLUSH 100 UNIT/ML IV SOLN
500.0000 [IU] | Freq: Once | INTRAVENOUS | Status: AC | PRN
  Administered 2012-07-14: 500 [IU]
  Filled 2012-07-14: qty 5

## 2012-07-14 MED ORDER — DEXAMETHASONE 4 MG PO TABS: 8.0000 mg | ORAL_TABLET | Freq: Two times a day (BID) | ORAL | Status: DC

## 2012-07-14 MED ORDER — ONDANSETRON HCL 8 MG PO TABS: 8.0000 mg | ORAL_TABLET | Freq: Two times a day (BID) | ORAL | Status: DC

## 2012-07-14 MED ORDER — LIDOCAINE-PRILOCAINE 2.5-2.5 % EX CREA: TOPICAL_CREAM | CUTANEOUS | Status: DC | PRN

## 2012-07-14 MED ORDER — DOCETAXEL CHEMO INJECTION 160 MG/16ML
75.0000 mg/m2 | Freq: Once | INTRAVENOUS | Status: AC
  Administered 2012-07-14: 130 mg via INTRAVENOUS
  Filled 2012-07-14: qty 13

## 2012-07-14 MED ORDER — PROCHLORPERAZINE 25 MG RE SUPP: 25.0000 mg | Freq: Two times a day (BID) | RECTAL | Status: DC | PRN

## 2012-07-14 MED ORDER — ONDANSETRON 16 MG/50ML IVPB (CHCC)
16.0000 mg | Freq: Once | INTRAVENOUS | Status: AC
Start: 2012-07-14 — End: 2012-07-14
  Administered 2012-07-14: 16 mg via INTRAVENOUS

## 2012-07-14 NOTE — Patient Instructions (Addendum)
Proceed with chemotherapy today.  Injection tomorrow  I will see you back in 1 week with blood work and office visit and herceptin only

## 2012-07-14 NOTE — Progress Notes (Signed)
OFFICE PROGRESS NOTE  CC  KelliKARRAR, MD [address] [place] [address] Dr. Emelia Thomas Dr. Lonie Thomas  DIAGNOSIS: 67 year old female with invasive ductal carcinoma of the left breast ER positive PR positive HER-2/neu positive Ki-67 55%.   PRIOR THERAPY:  #1 Who noted a left breast mass on self throat examination. She underwent a mammogram in December that showed the mass. She went on to have it biopsied on 05/14/2012. The pathology revealed invasive ductal carcinoma that was ER +100% PR +100% HER-2/neu positive with a ratio of 2.45. Ki-67 was 55% and elevated tumor was grade 2. She had an MRI performed that showed the area to be measuring out at 1.6 cm.   #2 patient underwent a lumpectomy with sentinel lymph node biopsy on 06/10/2012. The final pathology revealed 1.2 cm invasive ductal carcinoma that was ER positive PR positive HER-2/neu positive with a Ki-67 of 55%. Tumor was grade 2 one sentinel node was positive for metastatic disease.   #3 patient is recommended adjuvant HER-2 based chemotherapy consisting of Taxotere carboplatinum and Herceptin beginning 07/14/2012 total of 6 cycles of Taxotere and carboplatinum are recommended. She will also receive weekly Herceptin for the duration of her chemotherapy.   CURRENT THERAPY: cycle 1 of Taxotere carboplatinum and Herceptin with day 2 Neulasta  INTERVAL HISTORY: [name] 67 y.o. female returns for followup visit today prior to her chemotherapy. Clinically she's doing well she is without any significant complaints. She denies any fevers chills night sweats headaches. She has her Port-A-Cath in place. She tolerated surgery well. She has had chemotherapy class she also has had cardiology consultation as well as echocardiogram. Remainder of the 10 point review of systems is negative.  MEDICAL HISTORY: Past Medical History  Diagnosis Date  . Hyperlipidemia   . Invasive ductal carcinoma of breast  2013    Left  . Arthritis   . Depression   . Heart murmur     heard one when she was pregnamt-maybe had echo-cannot remember  . Hypertension   . GERD (gastroesophageal reflux disease)     ALLERGIES:  has No Known Allergies.  MEDICATIONS:  Current Outpatient Prescriptions  Medication Sig Dispense Refill  . Ascorbic Acid (VITAMIN C) 1000 MG tablet Take 1,000 mg by mouth daily.        Marland Kitchen aspirin 325 MG tablet Take 325 mg by mouth daily.        Marland Kitchen atenolol (TENORMIN) 50 MG tablet Take 1 tablet by mouth daily.       . Cholecalciferol (VITAMIN D) 2000 UNITS CAPS Take 1 capsule by mouth daily.        . citalopram (CELEXA) 10 MG tablet Take 10 mg by mouth daily.       Marland Kitchen olmesartan-hydrochlorothiazide (BENICAR HCT) 40-12.5 MG per tablet Take 1 tablet by mouth daily.      Marland Kitchen omeprazole (PRILOSEC) 40 MG capsule Take 1 tablet by mouth Daily.      . simvastatin (ZOCOR) 80 MG tablet Take 40 mg by mouth at bedtime.       . vitamin E 400 UNIT capsule Take 400 Units by mouth daily.        Marland Kitchen oxyCODONE-acetaminophen (PERCOCET/ROXICET) 5-325 MG per tablet Take 1-2 tablets by mouth every 6 (six) hours as needed.  40 tablet  0   No current facility-administered medications for this visit.    SURGICAL HISTORY:  Past Surgical History  Procedure Laterality Date  . Tubal ligation  1988  .  Bladder suspension  10/2010  . Tonsillectomy and adenoidectomy      age 58  . Abdominal adhesion surgery  1980  . Benign breast biopsy  1994    Left  . Breast lumpectomy with needle localization and axillary sentinel lymph node bx  06/10/2012    Procedure: BREAST LUMPECTOMY WITH NEEDLE LOCALIZATION AND AXILLARY SENTINEL LYMPH NODE BX;  Surgeon: Kelli Loron, MD;  Location: [facility];  Service: General;  Laterality: Left;  . Appendectomy    . Cholecystectomy    . Portacath placement  07/07/2012    Procedure: INSERTION PORT-A-CATH;  Surgeon: Kelli Loron, MD;  Location:  SURGERY CENTER;   Service: General;  Laterality: Left;  Port a cath insertion    REVIEW OF SYSTEMS:  Pertinent items are noted in HPI.   HEALTH MAINTENANCE:   PHYSICAL EXAMINATION: Blood pressure 115/72, pulse 66, temperature 98.2 F (36.8 C), temperature source Oral, resp. rate 20, height 5\' 2"  (1.575 m), weight 160 lb 9.6 oz (72.848 kg). Body mass index is 29.37 kg/(m^2). ECOG PERFORMANCE STATUS: 1 - Symptomatic but completely ambulatory   General appearance: alert, cooperative and appears stated age Lymph nodes: Cervical, supraclavicular, and axillary nodes normal. Resp: clear to auscultation bilaterally Back: symmetric, no curvature. ROM normal. No CVA tenderness. Cardio: regular rate and rhythm GI: soft, non-tender; bowel sounds normal; no masses,  no organomegaly Extremities: extremities normal, atraumatic, no cyanosis or edema Neurologic: Grossly normal Left breast reveals a well-healed incisional scar no masses nipple discharge skin changes right breast no masses or nipple discharge.  LABORATORY DATA: Lab Results  Component Value Date   WBC 5.7 07/14/2012   HGB 12.0 07/14/2012   HCT 36.6 07/14/2012   MCV 90.6 07/14/2012   PLT 222 07/14/2012      Chemistry      Component Value Date/Time   NA 139 07/09/2012 0535   NA 140 06/14/2012 1008   K 4.2 07/09/2012 0535   K 3.9 06/14/2012 1008   CL 106 07/09/2012 0535   CL 103 06/14/2012 1008   CO2 26 07/09/2012 0535   CO2 30* 06/14/2012 1008   BUN 21 07/09/2012 0535   BUN 26.0 06/14/2012 1008   CREATININE 1.13* 07/09/2012 0535   CREATININE 1.2* 06/14/2012 1008      Component Value Date/Time   CALCIUM 8.8 07/09/2012 0535   CALCIUM 10.1 06/14/2012 1008   ALKPHOS 71 06/14/2012 1008   ALKPHOS 65 06/08/2012 0924   AST 22 06/14/2012 1008   AST 22 06/08/2012 0924   ALT 28 06/14/2012 1008   ALT 21 06/08/2012 0924   BILITOT 0.66 06/14/2012 1008   BILITOT 0.5 06/08/2012 0924     FINAL DIAGNOSIS Diagnosis 1. Breast, lumpectomy, Left - INVASIVE DUCTAL CARCINOMA (1.2CM)  SEE COMMENT - LYMPHOVASCULAR INVASION IDENTIFIED. - INVASIVE TUMOR IS LESS THAN 0.1 MM FROM NEAREST MARGIN (ANTERIOR). - LYMPHOVASCULAR INVASION IDENTIFIED. - DUCTAL CARCINOMA IN SITU WITH CALCIFICATIONS AND COMEDO NECROSIS. - IN SITU CARCINOMA IS PRESENT AT ANTERIOR MARGIN. - SEE TUMOR SYNOPTIC TEMPLATE BELOW. 2. Lymph node, sentinel, biopsy, Left - ONE LYMPH NODE, POSITIVE FOR METASTATIC MAMMARY CARCINOMA (1/1) - TUMOR DEPOSIT IS 1.1CM - NO EXTRACAPSULAR TUMOR EXTENSION PRESENT. Microscopic Comment 1. BREAST, INVASIVE TUMOR, WITH LYMPH NODE SAMPLING Specimen, including laterality: Left breast. Procedure: Lumpectomy. Grade: II of III Tubule formation: 3 Nuclear pleomorphism: 2 Mitotic:1 Tumor size (gross measurement): 1.2 cm Margins: Invasive, distance to closest margin: Less than 0.1 mm, see comment. In-situ, distance to closest margin: Present  at anterior margin. If margin positive, focally or broadly: Focally Lymphovascular invasion: Present. Ductal carcinoma in situ: Present Grade: III of III Extensive intraductal component: Absent. 1 of 3 FINAL for LACEY, WALLMAN ([id number]) Microscopic Comment(continued) Lobular neoplasia: Absent. Tumor focality: Unifocal. Treatment effect: None. If present, treatment effect in breast tissue, lymph nodes or both: N/A Extent of tumor: Skin: Negative for tumor. Nipple: N/A Skeletal muscle: N/A Lymph nodes: # examined: 1 Lymph nodes with metastasis: 1 Macrometastasis: (> 2.0 mm): 1.1 cm Extracapsular extension: Absent. Breast prognostic profile: Estrogen receptor: Not repeated, previous study demonstrated 100% positivity ([id number]) Progesterone receptor: Not repeated, previous study demonstrated 100% positivity ([id number]) Her 2 neu: Repeated, previous study demonstrated amplification at (2.45) ([id number]). Ki-67: Not repeated, previous study demonstrated 55% proliferation rate. Non-neoplastic breast: Previous biopsy  site, vascular calcifications, benign fibrocytic change, and microcalcifications in benign ducts and lobules. TNM: pT1c, pN1a, pMX Comments: Although invasive tumor extends into cauterized tissue, there is no invasive tumor definitively identified at the cauterized edge (slides 1A-1C). (BJS:gt, 06/14/12) [place] RUND DO Pathologist, Electronic Signature (Case signed 06/14/2012) Specimen Gross and Clinical Information Specimen(s) Obtained: 1. Breast, lumpectomy, Left 2. Lymph node, sentinel, biopsy, Left Specimen Clinical Information  RADIOGRAPHIC STUDIES:   ASSESSMENT: 67 year old female with  #1 stage II (T1 N1) invasive ductal carcinoma measuring 1.2 cm of the left breast with associated DCIS. Tumor is grade 2 ER positive PR positive HER-2/neu positive. Patient is status post lumpectomy on 06/10/2012. She is also status post Port-A-Cath placement.  #2 she will now proceed with adjuvant chemotherapy consisting of Taxotere carboplatinum given every 3 weeks for a total of 6 cycles. She will also receive Herceptin every week for duration of chemotherapy.  #3 once patient completes her chemotherapy she will proceed with radiation therapy. I will make this referral at the end of her completion of chemotherapy.  #4 patient has been recommended and instructed on how to take her antiemetics. All of her prescriptions have been sent to her pharmacy.   PLAN:   #1 proceed with cycle 1 day 1 of Taxotere carboplatinum and Herceptin.  #2 she will return on day 2 for Neulasta injection. She knows to take Zyrtec as well as Tylenol or Aleve to prevent aches and pains from the injection.  #3 she will be seen back in one week's time for followup and Herceptin only.   All questions were answered. The patient knows to call the clinic with any problems, questions or concerns. We can certainly see the patient much sooner if necessary.  I spent 25 minutes counseling the patient face to face. The total  time spent in the appointment was 30 minutes.    Drue Second, MD Medical/Oncology [facility] [phone] (beeper) [phone] (Office)  07/14/2012, 11:01 AM

## 2012-07-14 NOTE — Patient Instructions (Addendum)
[facility] Discharge Instructions for Patients Receiving Chemotherapy  Today you received the following chemotherapy agents Taxotere, Carboplatin and Herceptin.  To help prevent nausea and vomiting after your treatment, we encourage you to take your nausea medication. Begin taking your nausea medication as often as prescribed for by Dr. Welton Flakes    If you develop nausea and vomiting that is not controlled by your nausea medication, call the clinic. If it is after clinic hours your family physician or the after hours number for the clinic or go to the Emergency Department.   BELOW ARE SYMPTOMS THAT SHOULD BE REPORTED IMMEDIATELY:  *FEVER GREATER THAN 100.5 F  *CHILLS WITH OR WITHOUT FEVER  NAUSEA AND VOMITING THAT IS NOT CONTROLLED WITH YOUR NAUSEA MEDICATION  *UNUSUAL SHORTNESS OF BREATH  *UNUSUAL BRUISING OR BLEEDING  TENDERNESS IN MOUTH AND THROAT WITH OR WITHOUT PRESENCE OF ULCERS  *URINARY PROBLEMS  *BOWEL PROBLEMS  UNUSUAL RASH Items with * indicate a potential emergency and should be followed up as soon as possible.  One of the nurses will contact you 24 hours after your treatment. Please let the nurse know about any problems that you may have experienced. Feel free to call the clinic you have any questions or concerns. The clinic phone number is [phone].   I have been informed and understand all the instructions given to me. I know to contact the clinic, my physician, or go to the Emergency Department if any problems should occur. I do not have any questions at this time, but understand that I may call the clinic during office hours   should I have any questions or need assistance in obtaining follow up care.    __________________________________________  _____________  __________ Signature of Patient or Authorized Representative            Date                   Time    __________________________________________ Nurse's Signature   Docetaxel  injection What is this medicine? DOCETAXEL (doe se TAX el) is a chemotherapy drug. It targets fast dividing cells, like cancer cells, and causes these cells to die. This medicine is used to treat many types of cancers like breast cancer, certain stomach cancers, head and neck cancer, lung cancer, and prostate cancer. This medicine may be used for other purposes; ask your health care provider or pharmacist if you have questions. What should I tell my health care provider before I take this medicine? They need to know if you have any of these conditions: -infection (especially a virus infection such as chickenpox, cold sores, or herpes) -liver disease -low blood counts, like low white cell, platelet, or red cell counts -an unusual or allergic reaction to docetaxel, polysorbate 80, other chemotherapy agents, other medicines, foods, dyes, or preservatives -pregnant or trying to get pregnant -breast-feeding How should I use this medicine? This drug is given as an infusion into a vein. It is administered in a hospital or clinic by a specially trained health care professional. Talk to your pediatrician regarding the use of this medicine in children. Special care may be needed. Overdosage: If you think you have taken too much of this medicine contact a poison control center or emergency room at once. NOTE: This medicine is only for you. Do not share this medicine with others. What if I miss a dose? It is important not to miss your dose. Call your doctor or health care professional if you are  unable to keep an appointment. What may interact with this medicine? -cyclosporine -erythromycin -ketoconazole -medicines to increase blood counts like filgrastim, pegfilgrastim, sargramostim -vaccines Talk to your doctor or health care professional before taking any of these medicines: -acetaminophen -aspirin -ibuprofen -ketoprofen -naproxen This list may not describe all possible interactions. Give your  health care provider a list of all the medicines, herbs, non-prescription drugs, or dietary supplements you use. Also tell them if you smoke, drink alcohol, or use illegal drugs. Some items may interact with your medicine. What should I watch for while using this medicine? Your condition will be monitored carefully while you are receiving this medicine. You will need important blood work done while you are taking this medicine. This drug may make you feel generally unwell. This is not uncommon, as chemotherapy can affect healthy cells as well as cancer cells. Report any side effects. Continue your course of treatment even though you feel ill unless your doctor tells you to stop. In some cases, you may be given additional medicines to help with side effects. Follow all directions for their use. Call your doctor or health care professional for advice if you get a fever, chills or sore throat, or other symptoms of a cold or flu. Do not treat yourself. This drug decreases your body's ability to fight infections. Try to avoid being around people who are sick. This medicine may increase your risk to bruise or bleed. Call your doctor or health care professional if you notice any unusual bleeding. Be careful brushing and flossing your teeth or using a toothpick because you may get an infection or bleed more easily. If you have any dental work done, tell your dentist you are receiving this medicine. Avoid taking products that contain aspirin, acetaminophen, ibuprofen, naproxen, or ketoprofen unless instructed by your doctor. These medicines may hide a fever. Do not become pregnant while taking this medicine. Women should inform their doctor if they wish to become pregnant or think they might be pregnant. There is a potential for serious side effects to an unborn child. Talk to your health care professional or pharmacist for more information. Do not breast-feed an infant while taking this medicine. What side effects  may I notice from receiving this medicine? Side effects that you should report to your doctor or health care professional as soon as possible: -allergic reactions like skin rash, itching or hives, swelling of the face, lips, or tongue -low blood counts - This drug may decrease the number of white blood cells, red blood cells and platelets. You may be at increased risk for infections and bleeding. -signs of infection - fever or chills, cough, sore throat, pain or difficulty passing urine -signs of decreased platelets or bleeding - bruising, pinpoint red spots on the skin, black, tarry stools, nosebleeds -signs of decreased red blood cells - unusually weak or tired, fainting spells, lightheadedness -breathing problems -fast or irregular heartbeat -low blood pressure -mouth sores -nausea and vomiting -pain, swelling, redness or irritation at the injection site -pain, tingling, numbness in the hands or feet -swelling of the ankle, feet, hands -weight gain Side effects that usually do not require medical attention (report to your prescriber or health care professional if they continue or are bothersome): -bone pain -complete hair loss including hair on your head, underarms, pubic hair, eyebrows, and eyelashes -diarrhea -excessive tearing -changes in the color of fingernails -loosening of the fingernails -nausea -muscle pain -red flush to skin -sweating -weak or tired This list may not describe  all possible side effects. Call your doctor for medical advice about side effects. You may report side effects to FDA at 1-800-FDA-1088. Where should I keep my medicine? This drug is given in a hospital or clinic and will not be stored at home. NOTE: This sheet is a summary. It may not cover all possible information. If you have questions about this medicine, talk to your doctor, pharmacist, or health care provider.  2013, [employer] Standard. (05/01/2008 11:52:10 AM)  Carboplatin  injection What is this medicine? CARBOPLATIN (KAR boe pla tin) is a chemotherapy drug. It targets fast dividing cells, like cancer cells, and causes these cells to die. This medicine is used to treat ovarian cancer and many other cancers. This medicine may be used for other purposes; ask your health care provider or pharmacist if you have questions. What should I tell my health care provider before I take this medicine? They need to know if you have any of these conditions: -blood disorders -hearing problems -kidney disease -recent or ongoing radiation therapy -an unusual or allergic reaction to carboplatin, cisplatin, other chemotherapy, other medicines, foods, dyes, or preservatives -pregnant or trying to get pregnant -breast-feeding How should I use this medicine? This drug is usually given as an infusion into a vein. It is administered in a hospital or clinic by a specially trained health care professional. Talk to your pediatrician regarding the use of this medicine in children. Special care may be needed. Overdosage: If you think you have taken too much of this medicine contact a poison control center or emergency room at once. NOTE: This medicine is only for you. Do not share this medicine with others. What if I miss a dose? It is important not to miss a dose. Call your doctor or health care professional if you are unable to keep an appointment. What may interact with this medicine? -medicines for seizures -medicines to increase blood counts like filgrastim, pegfilgrastim, sargramostim -some antibiotics like amikacin, gentamicin, neomycin, streptomycin, tobramycin -vaccines Talk to your doctor or health care professional before taking any of these medicines: -acetaminophen -aspirin -ibuprofen -ketoprofen -naproxen This list may not describe all possible interactions. Give your health care provider a list of all the medicines, herbs, non-prescription drugs, or dietary supplements  you use. Also tell them if you smoke, drink alcohol, or use illegal drugs. Some items may interact with your medicine. What should I watch for while using this medicine? Your condition will be monitored carefully while you are receiving this medicine. You will need important blood work done while you are taking this medicine. This drug may make you feel generally unwell. This is not uncommon, as chemotherapy can affect healthy cells as well as cancer cells. Report any side effects. Continue your course of treatment even though you feel ill unless your doctor tells you to stop. In some cases, you may be given additional medicines to help with side effects. Follow all directions for their use. Call your doctor or health care professional for advice if you get a fever, chills or sore throat, or other symptoms of a cold or flu. Do not treat yourself. This drug decreases your body's ability to fight infections. Try to avoid being around people who are sick. This medicine may increase your risk to bruise or bleed. Call your doctor or health care professional if you notice any unusual bleeding. Be careful brushing and flossing your teeth or using a toothpick because you may get an infection or bleed more easily. If you have  any dental work done, tell your dentist you are receiving this medicine. Avoid taking products that contain aspirin, acetaminophen, ibuprofen, naproxen, or ketoprofen unless instructed by your doctor. These medicines may hide a fever. Do not become pregnant while taking this medicine. Women should inform their doctor if they wish to become pregnant or think they might be pregnant. There is a potential for serious side effects to an unborn child. Talk to your health care professional or pharmacist for more information. Do not breast-feed an infant while taking this medicine. What side effects may I notice from receiving this medicine? Side effects that you should report to your doctor or  health care professional as soon as possible: -allergic reactions like skin rash, itching or hives, swelling of the face, lips, or tongue -signs of infection - fever or chills, cough, sore throat, pain or difficulty passing urine -signs of decreased platelets or bleeding - bruising, pinpoint red spots on the skin, black, tarry stools, nosebleeds -signs of decreased red blood cells - unusually weak or tired, fainting spells, lightheadedness -breathing problems -changes in hearing -changes in vision -chest pain -high blood pressure -low blood counts - This drug may decrease the number of white blood cells, red blood cells and platelets. You may be at increased risk for infections and bleeding. -nausea and vomiting -pain, swelling, redness or irritation at the injection site -pain, tingling, numbness in the hands or feet -problems with balance, talking, walking -trouble passing urine or change in the amount of urine Side effects that usually do not require medical attention (report to your doctor or health care professional if they continue or are bothersome): -hair loss -loss of appetite -metallic taste in the mouth or changes in taste This list may not describe all possible side effects. Call your doctor for medical advice about side effects. You may report side effects to FDA at 1-800-FDA-1088. Where should I keep my medicine? This drug is given in a hospital or clinic and will not be stored at home. NOTE: This sheet is a summary. It may not cover all possible information. If you have questions about this medicine, talk to your doctor, pharmacist, or health care provider.  2012, [employer] Standard. (08/24/2007 2:38:05 PM)  Trastuzumab injection for infusion What is this medicine? TRASTUZUMAB (tras TOO zoo mab) is a monoclonal antibody. It targets a protein called HER2. This protein is found in some stomach and breast cancers. This medicine can stop cancer cell growth. This medicine  may be used with other cancer treatments. This medicine may be used for other purposes; ask your health care provider or pharmacist if you have questions. What should I tell my health care provider before I take this medicine? They need to know if you have any of these conditions: -heart disease -heart failure -infection (especially a virus infection such as chickenpox, cold sores, or herpes) -lung or breathing disease, like asthma -recent or ongoing radiation therapy -an unusual or allergic reaction to trastuzumab, benzyl alcohol, or other medications, foods, dyes, or preservatives -pregnant or trying to get pregnant -breast-feeding How should I use this medicine? This drug is given as an infusion into a vein. It is administered in a hospital or clinic by a specially trained health care professional. Talk to your pediatrician regarding the use of this medicine in children. This medicine is not approved for use in children. Overdosage: If you think you have taken too much of this medicine contact a poison control center or emergency room at once. NOTE: This  medicine is only for you. Do not share this medicine with others. What if I miss a dose? It is important not to miss a dose. Call your doctor or health care professional if you are unable to keep an appointment. What may interact with this medicine? -cyclophosphamide -doxorubicin -warfarin This list may not describe all possible interactions. Give your health care provider a list of all the medicines, herbs, non-prescription drugs, or dietary supplements you use. Also tell them if you smoke, drink alcohol, or use illegal drugs. Some items may interact with your medicine. What should I watch for while using this medicine? Visit your doctor for checks on your progress. Report any side effects. Continue your course of treatment even though you feel ill unless your doctor tells you to stop. Call your doctor or health care professional for  advice if you get a fever, chills or sore throat, or other symptoms of a cold or flu. Do not treat yourself. Try to avoid being around people who are sick. You may experience fever, chills and shaking during your first infusion. These effects are usually mild and can be treated with other medicines. Report any side effects during the infusion to your health care professional. Fever and chills usually do not happen with later infusions. What side effects may I notice from receiving this medicine? Side effects that you should report to your doctor or other health care professional as soon as possible: -breathing difficulties -chest pain or palpitations -cough -dizziness or fainting -fever or chills, sore throat -skin rash, itching or hives -swelling of the legs or ankles -unusually weak or tired Side effects that usually do not require medical attention (report to your doctor or other health care professional if they continue or are bothersome): -loss of appetite -headache -muscle aches -nausea This list may not describe all possible side effects. Call your doctor for medical advice about side effects. You may report side effects to FDA at 1-800-FDA-1088. Where should I keep my medicine? This drug is given in a hospital or clinic and will not be stored at home. NOTE: This sheet is a summary. It may not cover all possible information. If you have questions about this medicine, talk to your doctor, pharmacist, or health care provider.  2013, [employer]/Gold Standard. (03/23/2009 1:43:15 PM)

## 2012-07-15 ENCOUNTER — Telehealth: Payer: Self-pay | Admitting: Oncology

## 2012-07-15 ENCOUNTER — Telehealth: Payer: Self-pay | Admitting: *Deleted

## 2012-07-15 ENCOUNTER — Ambulatory Visit (HOSPITAL_BASED_OUTPATIENT_CLINIC_OR_DEPARTMENT_OTHER): Payer: BC Managed Care – PPO

## 2012-07-15 VITALS — BP 129/70 | HR 67 | Temp 97.8°F

## 2012-07-15 DIAGNOSIS — C50419 Malignant neoplasm of upper-outer quadrant of unspecified female breast: Secondary | ICD-10-CM

## 2012-07-15 DIAGNOSIS — C50219 Malignant neoplasm of upper-inner quadrant of unspecified female breast: Secondary | ICD-10-CM

## 2012-07-15 MED ORDER — PEGFILGRASTIM INJECTION 6 MG/0.6ML
6.0000 mg | Freq: Once | SUBCUTANEOUS | Status: AC
  Administered 2012-07-15: 6 mg via SUBCUTANEOUS
  Filled 2012-07-15: qty 0.6

## 2012-07-15 NOTE — Telephone Encounter (Signed)
Patient here for Neulasta linection following 1st tch chemo treatment.  States she is doing well, no nausea, vomiting, or diarrhea.  Eating and drinking well.  All question answered.  Knows to call the clinic if she has any problems or concerns.

## 2012-07-15 NOTE — Telephone Encounter (Signed)
added chemo class pt aware

## 2012-07-15 NOTE — Telephone Encounter (Signed)
Saw injection nurse today.  See note from Staci Righter LPN

## 2012-07-16 ENCOUNTER — Encounter (INDEPENDENT_AMBULATORY_CARE_PROVIDER_SITE_OTHER): Payer: BC Managed Care – PPO | Admitting: General Surgery

## 2012-07-16 ENCOUNTER — Other Ambulatory Visit: Payer: Self-pay | Admitting: Certified Registered Nurse Anesthetist

## 2012-07-17 ENCOUNTER — Other Ambulatory Visit: Payer: Self-pay

## 2012-07-19 ENCOUNTER — Other Ambulatory Visit: Payer: BC Managed Care – PPO

## 2012-07-21 ENCOUNTER — Telehealth: Payer: Self-pay | Admitting: Oncology

## 2012-07-21 ENCOUNTER — Ambulatory Visit (HOSPITAL_BASED_OUTPATIENT_CLINIC_OR_DEPARTMENT_OTHER): Payer: BC Managed Care – PPO

## 2012-07-21 ENCOUNTER — Other Ambulatory Visit: Payer: BC Managed Care – PPO | Admitting: Lab

## 2012-07-21 VITALS — BP 103/59 | HR 73 | Temp 98.2°F

## 2012-07-21 DIAGNOSIS — C50419 Malignant neoplasm of upper-outer quadrant of unspecified female breast: Secondary | ICD-10-CM

## 2012-07-21 LAB — COMPREHENSIVE METABOLIC PANEL (CC13)
ALT: 29 U/L (ref 0–55)
AST: 17 U/L (ref 5–34)
CO2: 23 mEq/L (ref 22–29)
Calcium: 9.7 mg/dL (ref 8.4–10.4)
Chloride: 106 mEq/L (ref 98–107)
Creatinine: 1.1 mg/dL (ref 0.6–1.1)
Sodium: 140 mEq/L (ref 136–145)
Total Bilirubin: 0.28 mg/dL (ref 0.20–1.20)
Total Protein: 6.8 g/dL (ref 6.4–8.3)

## 2012-07-21 LAB — CBC WITH DIFFERENTIAL/PLATELET
BASO%: 0.2 % (ref 0.0–2.0)
EOS%: 0.5 % (ref 0.0–7.0)
MCH: 29.4 pg (ref 25.1–34.0)
MCHC: 32.7 g/dL (ref 31.5–36.0)
MONO#: 1.5 10*3/uL — ABNORMAL HIGH (ref 0.1–0.9)
NEUT%: 67 % (ref 38.4–76.8)
RBC: 4.11 10*6/uL (ref 3.70–5.45)
WBC: 12.8 10*3/uL — ABNORMAL HIGH (ref 3.9–10.3)
lymph#: 2.7 10*3/uL (ref 0.9–3.3)

## 2012-07-21 MED ORDER — SODIUM CHLORIDE 0.9 % IV SOLN
Freq: Once | INTRAVENOUS | Status: AC
  Administered 2012-07-21: 09:00:00 via INTRAVENOUS

## 2012-07-21 MED ORDER — TRASTUZUMAB CHEMO INJECTION 440 MG
2.0000 mg/kg | Freq: Once | INTRAVENOUS | Status: AC
  Administered 2012-07-21: 147 mg via INTRAVENOUS
  Filled 2012-07-21: qty 7

## 2012-07-21 MED ORDER — ACETAMINOPHEN 325 MG PO TABS
650.0000 mg | ORAL_TABLET | Freq: Once | ORAL | Status: AC
  Administered 2012-07-21: 650 mg via ORAL

## 2012-07-21 MED ORDER — DIPHENHYDRAMINE HCL 25 MG PO CAPS
50.0000 mg | ORAL_CAPSULE | Freq: Once | ORAL | Status: AC
  Administered 2012-07-21: 50 mg via ORAL

## 2012-07-21 NOTE — Telephone Encounter (Signed)
S/w pt re next appt for 2/26 and pt to get schedule when she comes in. Still no availability for 3/12 f/u. Providers can address this at any weekly f/u visit prior to 3/12.

## 2012-07-21 NOTE — Patient Instructions (Addendum)
[facility] Discharge Instructions for Patients Receiving Chemotherapy  Today you received the following chemotherapy agents herceptin  To help prevent nausea and vomiting after your treatment, we encourage you to take your nausea medication  and take it as often as prescribedIf you develop nausea and vomiting that is not controlled by your nausea medication, call the clinic. If it is after clinic hours your family physician or the after hours number for the clinic or go to the Emergency Department.   BELOW ARE SYMPTOMS THAT SHOULD BE REPORTED IMMEDIATELY:  *FEVER GREATER THAN 100.5 F  *CHILLS WITH OR WITHOUT FEVER  NAUSEA AND VOMITING THAT IS NOT CONTROLLED WITH YOUR NAUSEA MEDICATION  *UNUSUAL SHORTNESS OF BREATH  *UNUSUAL BRUISING OR BLEEDING  TENDERNESS IN MOUTH AND THROAT WITH OR WITHOUT PRESENCE OF ULCERS  *URINARY PROBLEMS  *BOWEL PROBLEMS  UNUSUAL RASH Items with * indicate a potential emergency and should be followed up as soon as possible.  One of the nurses will contact you 24 hours after your treatment. Please let the nurse know about any problems that you may have experienced. Feel free to call the clinic you have any questions or concerns. The clinic phone number is [phone].   I have been informed and understand all the instructions given to me. I know to contact the clinic, my physician, or go to the Emergency Department if any problems should occur. I do not have any questions at this time, but understand that I may call the clinic during office hours   should I have any questions or need assistance in obtaining follow up care.    __________________________________________  _____________  __________ Signature of Patient or Authorized Representative            Date                   Time    __________________________________________ Nurse's Signature    

## 2012-07-23 ENCOUNTER — Encounter (INDEPENDENT_AMBULATORY_CARE_PROVIDER_SITE_OTHER): Payer: Self-pay | Admitting: General Surgery

## 2012-07-23 ENCOUNTER — Ambulatory Visit (INDEPENDENT_AMBULATORY_CARE_PROVIDER_SITE_OTHER): Payer: BC Managed Care – PPO | Admitting: General Surgery

## 2012-07-23 VITALS — BP 110/70 | HR 78 | Temp 97.7°F | Resp 18 | Ht 62.0 in | Wt 160.0 lb

## 2012-07-27 NOTE — Progress Notes (Signed)
Subjective:     Patient ID: [name], female   DOB: February 10, 1946, 67 y.o.   MRN: [id number]  HPI This is a 67 year old female who underwent a left breast lumpectomy and axillary sentinel lymph node biopsy. Her pathology report is a little bit confusing but I discussed this with the pathologist. Her margins are clear. 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 She has been seen in the multidisciplinary fashion and we decided to manage her using his Z11 data. She's going to proceed with chemotherapy followed by radiation therapy. I placed a Port-A-Cath which ended up being on the left side. She went home with a chest x-ray that was normal. She returned the next day with an acute onset of left-sided chest pain and was found to have a pneumothorax. She was admitted for observation and this remained stable. She was not symptomatic and I discharged her home. She is to start her chemotherapy and is otherwise doing well. Review of Systems     Objective:   Physical Exam Well healed breast, axillary incisions, port in place    Assessment:     S/p lump/sn S/p port with resolved ptx     Plan:     She's doing well despite the pneumothorax. She is tolerating her chemotherapy very well also. I will plan on seeing her back in about 6 months.

## 2012-07-28 ENCOUNTER — Ambulatory Visit (HOSPITAL_BASED_OUTPATIENT_CLINIC_OR_DEPARTMENT_OTHER): Payer: BC Managed Care – PPO

## 2012-07-28 ENCOUNTER — Ambulatory Visit (HOSPITAL_BASED_OUTPATIENT_CLINIC_OR_DEPARTMENT_OTHER): Payer: BC Managed Care – PPO | Admitting: Adult Health

## 2012-07-28 ENCOUNTER — Ambulatory Visit: Payer: BC Managed Care – PPO

## 2012-07-28 ENCOUNTER — Other Ambulatory Visit (HOSPITAL_BASED_OUTPATIENT_CLINIC_OR_DEPARTMENT_OTHER): Payer: BC Managed Care – PPO | Admitting: Lab

## 2012-07-28 ENCOUNTER — Other Ambulatory Visit: Payer: BC Managed Care – PPO | Admitting: Lab

## 2012-07-28 ENCOUNTER — Encounter: Payer: Self-pay | Admitting: Adult Health

## 2012-07-28 VITALS — BP 119/70 | HR 76 | Temp 98.2°F | Resp 20 | Ht 62.0 in | Wt 161.2 lb

## 2012-07-28 DIAGNOSIS — Z170 Estrogen receptor positive status [ER+]: Secondary | ICD-10-CM

## 2012-07-28 LAB — CBC WITH DIFFERENTIAL/PLATELET
Basophils Absolute: 0 10*3/uL (ref 0.0–0.1)
Eosinophils Absolute: 0 10*3/uL (ref 0.0–0.5)
HCT: 31.6 % — ABNORMAL LOW (ref 34.8–46.6)
HGB: 10.5 g/dL — ABNORMAL LOW (ref 11.6–15.9)
LYMPH%: 8.2 % — ABNORMAL LOW (ref 14.0–49.7)
MCV: 89 fL (ref 79.5–101.0)
MONO%: 1.8 % (ref 0.0–14.0)
NEUT#: 17.8 10*3/uL — ABNORMAL HIGH (ref 1.5–6.5)
NEUT%: 89.9 % — ABNORMAL HIGH (ref 38.4–76.8)
Platelets: 148 10*3/uL (ref 145–400)

## 2012-07-28 LAB — COMPREHENSIVE METABOLIC PANEL (CC13)
Alkaline Phosphatase: 96 U/L (ref 40–150)
BUN: 32.5 mg/dL — ABNORMAL HIGH (ref 7.0–26.0)
Glucose: 124 mg/dl — ABNORMAL HIGH (ref 70–99)
Total Bilirubin: 0.41 mg/dL (ref 0.20–1.20)

## 2012-07-28 MED ORDER — TRASTUZUMAB CHEMO INJECTION 440 MG
2.0000 mg/kg | Freq: Once | INTRAVENOUS | Status: AC
  Administered 2012-07-28: 147 mg via INTRAVENOUS
  Filled 2012-07-28: qty 7

## 2012-07-28 MED ORDER — HEPARIN SOD (PORK) LOCK FLUSH 100 UNIT/ML IV SOLN
500.0000 [IU] | Freq: Once | INTRAVENOUS | Status: AC | PRN
  Administered 2012-07-28: 500 [IU]
  Filled 2012-07-28: qty 5

## 2012-07-28 MED ORDER — DIPHENHYDRAMINE HCL 25 MG PO CAPS
50.0000 mg | ORAL_CAPSULE | Freq: Once | ORAL | Status: AC
  Administered 2012-07-28: 50 mg via ORAL

## 2012-07-28 MED ORDER — ACETAMINOPHEN 325 MG PO TABS
650.0000 mg | ORAL_TABLET | Freq: Once | ORAL | Status: AC
  Administered 2012-07-28: 650 mg via ORAL

## 2012-07-28 MED ORDER — SODIUM CHLORIDE 0.9 % IJ SOLN
10.0000 mL | INTRAMUSCULAR | Status: DC | PRN
  Administered 2012-07-28: 10 mL
  Filled 2012-07-28: qty 10

## 2012-07-28 MED ORDER — SODIUM CHLORIDE 0.9 % IV SOLN
Freq: Once | INTRAVENOUS | Status: AC
  Administered 2012-07-28: 10:00:00 via INTRAVENOUS

## 2012-07-28 NOTE — Patient Instructions (Signed)
[facility] Discharge Instructions for Patients Receiving Chemotherapy  Today you received the following chemotherapy agents Herceptin To help prevent nausea and vomiting after your treatment, we encourage you to take your nausea medication as prescribed.  If you develop nausea and vomiting that is not controlled by your nausea medication, call the clinic. If it is after clinic hours your family physician or the after hours number for the clinic or go to the Emergency Department.   BELOW ARE SYMPTOMS THAT SHOULD BE REPORTED IMMEDIATELY:  *FEVER GREATER THAN 100.5 F  *CHILLS WITH OR WITHOUT FEVER  NAUSEA AND VOMITING THAT IS NOT CONTROLLED WITH YOUR NAUSEA MEDICATION  *UNUSUAL SHORTNESS OF BREATH  *UNUSUAL BRUISING OR BLEEDING  TENDERNESS IN MOUTH AND THROAT WITH OR WITHOUT PRESENCE OF ULCERS  *URINARY PROBLEMS  *BOWEL PROBLEMS  UNUSUAL RASH Items with * indicate a potential emergency and should be followed up as soon as possible.  One of the nurses will contact you 24 hours after your treatment. Please let the nurse know about any problems that you may have experienced. Feel free to call the clinic you have any questions or concerns. The clinic phone number is [phone].   I have been informed and understand all the instructions given to me. I know to contact the clinic, my physician, or go to the Emergency Department if any problems should occur. I do not have any questions at this time, but understand that I may call the clinic during office hours   should I have any questions or need assistance in obtaining follow up care.    __________________________________________  _____________  __________ Signature of Patient or Authorized Representative            Date                   Time    __________________________________________ Nurse's Signature    

## 2012-07-28 NOTE — Patient Instructions (Addendum)
Doing well.  Proceed with Herceptin.  Please continue Kegel exercises for your bladder.  Please call us if you have any questions or concerns.

## 2012-07-28 NOTE — Progress Notes (Signed)
OFFICE PROGRESS NOTE  CCGeorgann Housekeeper, MD [address] [place] [address]  DIAGNOSIS: 67 year old female with invasive ductal carcinoma that was ER +100% PR +100% HER-2/neu positive with a ratio of 2.45. Ki-67 was 55% and elevated tumor was grade 2.  She had a lumpectomy and sentinel node biopsy revealing T1CN1A, stage IIA breast cancer of the left breast.       PRIOR THERAPY: 1. She noted a left breast mass on self throat examination. She underwent a mammogram in December that showed the mass. She went on to have it biopsied on 05/14/2012. The pathology revealed invasive ductal carcinoma that was ER +100% PR +100% HER-2/neu positive with a ratio of 2.45. Ki-67 was 55% and elevated tumor was grade 2. She had an MRI performed that showed the area to be measuring out at 1.6 cm  2. She underwent a lumpectomy with sentinel node biopsy on 06/10/12.  revealing T1CN1A, stage IIA breast cancer of the left breast.  3. She started chemotherapy consisting of TCH on 07/14/12 with 6 cycles planned.    CURRENT THERAPY: TCH C1 Day 15 with weekly herceptin  INTERVAL HISTORY: [name] 67 y.o. female returns for follow up today.  She has been doing well.  After she received her chemotherapy she did experience some bladder incontinence in the treatment room.  She has had some mild nausea relieved with her anti-emetics and bone pain after the Neulasta that was relieved with Tylenol.  Otherwise she denies fevers, chills, numbness, or any other concerns.   MEDICAL HISTORY: Past Medical History  Diagnosis Date  . Hyperlipidemia   . Invasive ductal carcinoma of breast 2013    Left  . Arthritis   . Depression   . Heart murmur     heard one when she was pregnamt-maybe had echo-cannot remember  . Hypertension   . GERD (gastroesophageal reflux disease)     ALLERGIES:  has No Known Allergies.  MEDICATIONS:  Current Outpatient Prescriptions  Medication Sig Dispense  Refill  . Ascorbic Acid (VITAMIN C) 1000 MG tablet Take 1,000 mg by mouth daily.        Marland Kitchen aspirin 325 MG tablet Take 325 mg by mouth daily.        Marland Kitchen atenolol (TENORMIN) 50 MG tablet Take 1 tablet by mouth daily.       . Cholecalciferol (VITAMIN D) 2000 UNITS CAPS Take 1 capsule by mouth daily.        . citalopram (CELEXA) 10 MG tablet Take 10 mg by mouth daily.       Marland Kitchen dexamethasone (DECADRON) 4 MG tablet Take 2 tablets (8 mg total) by mouth 2 (two) times daily with a meal. Take two times a day the day before Taxotere. Then take two times a day starting the day after chemo for 3 days.  30 tablet  1  . lidocaine-prilocaine (EMLA) cream Apply topically as needed.  30 g  7  . LORazepam (ATIVAN) 0.5 MG tablet Take 1 tablet (0.5 mg total) by mouth every 6 (six) hours as needed (Nausea or vomiting).  30 tablet  0  . olmesartan-hydrochlorothiazide (BENICAR HCT) 40-12.5 MG per tablet Take 1 tablet by mouth daily.      Marland Kitchen omeprazole (PRILOSEC) 40 MG capsule Take 1 tablet by mouth Daily.      . ondansetron (ZOFRAN) 8 MG tablet Take 1 tablet (8 mg total) by mouth 2 (two) times daily. Take two times a day starting the day  after chemo for 3 days. Then take two times a day as needed for nausea or vomiting.  30 tablet  1  . oxyCODONE-acetaminophen (PERCOCET/ROXICET) 5-325 MG per tablet Take 1-2 tablets by mouth every 6 (six) hours as needed.  40 tablet  0  . prochlorperazine (COMPAZINE) 10 MG tablet Take 1 tablet (10 mg total) by mouth every 6 (six) hours as needed (Nausea or vomiting).  30 tablet  1  . prochlorperazine (COMPAZINE) 25 MG suppository Place 1 suppository (25 mg total) rectally every 12 (twelve) hours as needed for nausea.  12 suppository  3  . simvastatin (ZOCOR) 80 MG tablet Take 40 mg by mouth at bedtime.       . vitamin E 400 UNIT capsule Take 400 Units by mouth daily.         No current facility-administered medications for this visit.    SURGICAL HISTORY:  Past Surgical History  Procedure  Laterality Date  . Tubal ligation  1988  . Bladder suspension  10/2010  . Tonsillectomy and adenoidectomy      age 59  . Abdominal adhesion surgery  1980  . Benign breast biopsy  1994    Left  . Breast lumpectomy with needle localization and axillary sentinel lymph node bx  06/10/2012    Procedure: BREAST LUMPECTOMY WITH NEEDLE LOCALIZATION AND AXILLARY SENTINEL LYMPH NODE BX;  Surgeon: Emelia Loron, MD;  Location: [facility];  Service: General;  Laterality: Left;  . Appendectomy    . Cholecystectomy    . Portacath placement  07/07/2012    Procedure: INSERTION PORT-A-CATH;  Surgeon: Emelia Loron, MD;  Location: [facility];  Service: General;  Laterality: Left;  Port a cath insertion    REVIEW OF SYSTEMS:    General: fatigue (-), night sweats (-), fever (-), pain (+) Lymph: palpable nodes (-) HEENT: vision changes (-), mucositis (-), gum bleeding (-), epistaxis (-) Cardiovascular: chest pain (-), palpitations (-) Pulmonary: shortness of breath (-), dyspnea on exertion (-), cough (-), hemoptysis (-) GI:  Early satiety (-), melena (-), dysphagia (-), nausea/vomiting (+), diarrhea (-) GU: dysuria (-), hematuria (-), incontinence (-) Musculoskeletal: joint swelling (-), joint pain (-), back pain (-) Neuro: weakness (-), numbness (-), headache (-), confusion (-) Skin: Rash (-), lesions (-), dryness (-) Psych: depression (-), suicidal/homicidal ideation (-), feeling of hopelessness (-)   PHYSICAL EXAMINATION: Blood pressure 119/70, pulse 76, temperature 98.2 F (36.8 C), temperature source Oral, resp. rate 20, height 5\' 2"  (1.575 m), weight 161 lb 3.2 oz (73.12 kg). Body mass index is 29.48 kg/(m^2). General: Patient is a well appearing female in no acute distress HEENT: PERRLA, sclerae anicteric no conjunctival pallor, MMM Neck: supple, no palpable adenopathy Lungs: clear to auscultation bilaterally, no wheezes, rhonchi, or rales Cardiovascular:  regular rate rhythm, S1, S2,  Systolic murmur, no rubs or gallops Abdomen: Soft, non-tender, non-distended, normoactive bowel sounds, no HSM Extremities: warm and well perfused, no clubbing, cyanosis, or edema Skin: No rashes or lesions Neuro: Non-focal Breasts:  Left breast incision site well healed, no nodularity, right breast no masses or nodules.  ECOG PERFORMANCE STATUS: 1 - Symptomatic but completely ambulatory      LABORATORY DATA: Lab Results  Component Value Date   WBC 19.7* 07/28/2012   HGB 10.5* 07/28/2012   HCT 31.6* 07/28/2012   MCV 89.0 07/28/2012   PLT 148 07/28/2012      Chemistry      Component Value Date/Time   NA 140 07/21/2012  0846   NA 139 07/09/2012 0535   K 4.4 07/21/2012 0846   K 4.2 07/09/2012 0535   CL 106 07/21/2012 0846   CL 106 07/09/2012 0535   CO2 23 07/21/2012 0846   CO2 26 07/09/2012 0535   BUN 41.6* 07/21/2012 0846   BUN 21 07/09/2012 0535   CREATININE 1.1 07/21/2012 0846   CREATININE 1.13* 07/09/2012 0535      Component Value Date/Time   CALCIUM 9.7 07/21/2012 0846   CALCIUM 8.8 07/09/2012 0535   ALKPHOS 98 07/21/2012 0846   ALKPHOS 65 06/08/2012 0924   AST 17 07/21/2012 0846   AST 22 06/08/2012 0924   ALT 29 07/21/2012 0846   ALT 21 06/08/2012 0924   BILITOT 0.28 07/21/2012 0846   BILITOT 0.5 06/08/2012 0924     FINAL DIAGNOSIS Diagnosis 1. Breast, lumpectomy, Left - INVASIVE DUCTAL CARCINOMA (1.2CM) SEE COMMENT - LYMPHOVASCULAR INVASION IDENTIFIED. - INVASIVE TUMOR IS LESS THAN 0.1 MM FROM NEAREST MARGIN (ANTERIOR). - LYMPHOVASCULAR INVASION IDENTIFIED. - DUCTAL CARCINOMA IN SITU WITH CALCIFICATIONS AND COMEDO NECROSIS. - IN SITU CARCINOMA IS PRESENT AT ANTERIOR MARGIN. - SEE TUMOR SYNOPTIC TEMPLATE BELOW. 2. Lymph node, sentinel, biopsy, Left - ONE LYMPH NODE, POSITIVE FOR METASTATIC MAMMARY CARCINOMA (1/1) - TUMOR DEPOSIT IS 1.1CM - NO EXTRACAPSULAR TUMOR EXTENSION PRESENT. Microscopic Comment 1. BREAST, INVASIVE TUMOR, WITH LYMPH NODE  SAMPLING Specimen, including laterality: Left breast. Procedure: Lumpectomy. Grade: II of III Tubule formation: 3 Nuclear pleomorphism: 2 Mitotic:1 Tumor size (gross measurement): 1.2 cm Margins: Invasive, distance to closest margin: Less than 0.1 mm, see comment. In-situ, distance to closest margin: Present at anterior margin. If margin positive, focally or broadly: Focally Lymphovascular invasion: Present. Ductal carcinoma in situ: Present Grade: III of III Extensive intraductal component: Absent. 1 of 3 FINAL for BRITTANEE, GHAZARIAN ([id number]) Microscopic Comment(continued) Lobular neoplasia: Absent. Tumor focality: Unifocal. Treatment effect: None. If present, treatment effect in breast tissue, lymph nodes or both: N/A Extent of tumor: Skin: Negative for tumor. Nipple: N/A Skeletal muscle: N/A Lymph nodes: # examined: 1 Lymph nodes with metastasis: 1 Macrometastasis: (> 2.0 mm): 1.1 cm Extracapsular extension: Absent. Breast prognostic profile: Estrogen receptor: Not repeated, previous study demonstrated 100% positivity ([id number]) Progesterone receptor: Not repeated, previous study demonstrated 100% positivity ([id number]) Her 2 neu: Repeated, previous study demonstrated amplification at (2.45) ([id number]). Ki-67: Not repeated, previous study demonstrated 55% proliferation rate. Non-neoplastic breast: Previous biopsy site, vascular calcifications, benign fibrocytic change, and microcalcifications in benign ducts and lobules. TNM: pT1c, pN1a, pMX Comments: Although invasive tumor extends into cauterized tissue, there is no invasive tumor definitively identified at the cauterized edge (slides 1A-1C). (BJS:gt, 06/14/12) [place] RUND DO Pathologist, Electronic Signature (Case signed 06/14/2012) Specimen Gross and Clinical Information Specimen(s) Obtained: 1. Breast, lumpectomy, Left 2. Lymph node, sentinel, biopsy, Left Specimen Clinical Information  RADIOGRAPHIC  STUDIES:   ASSESSMENT:  67 year old female with  #1 new diagnosis of 1.6 cm (by MRI) invasive ductal carcinoma grade 2 ER +100% PR +100% HER-2/neu amplified with a ratio of 2.45 with an elevated Ki-67 of 55%. Patient is status post biopsy on 05/14/2012. Patient found a mass on self breast examination. Patient was seen by Dr. Harden Mo and he feels that the patient is a breast conservation candidate with lumpectomy and sentinel lymph node biopsy. She is now being seen for discussion of adjuvant treatment post lumpectomy.   #2 patient and I discussed her pathology in detail. We discussed the significance of grade as well as elevated Ki-67 and  HER-2/neu positivity. We discussed use of HER-2 based treatment such as with Herceptin and chemotherapy. We discussed combination Herceptin and chemotherapy consisting of Taxotere carboplatinum and Herceptin. The Taxotere and carboplatinum would be given every 3 weeks for a total of 6 cycles. Herceptin would be given initially weekly with her chemotherapy and then changed to every 3 weeks to finish out one year of HER-2 therapy.   #3 patient certainly will need radiation therapy and she will be seen by Dr. Doristine Devoid.   #4 because patient's tumor is estrogen receptor positive she would also received adjuvant antiestrogen therapy in the form of an aromatase inhibitors and she is postmenopausal. We discussed the different aromatase inhibitors.    PLAN:   1. Doing well. Proceed with Herceptin today.    2. I recommended her start using kegel exercises for better bladder control.  Considering the amount of fluid she received in the treatment room, and that she has had problems with incontinence in the past, this was the likely source.  Should it continue to be of concern we will get a urinalysis and refer if necessary.   3. I will see her back next week for cycle 2 of TCH.     All questions were answered. The patient knows to call the clinic with any  problems, questions or concerns. We can certainly see the patient much sooner if necessary.  I spent 25 minutes counseling the patient face to face. The total time spent in the appointment was 30 minutes.    Drue Second, MD Medical/Oncology [facility] [phone] (beeper) [phone] (Office)  07/28/2012, 9:07 AM

## 2012-08-04 ENCOUNTER — Telehealth: Payer: Self-pay | Admitting: Oncology

## 2012-08-04 ENCOUNTER — Encounter: Payer: Self-pay | Admitting: Adult Health

## 2012-08-04 ENCOUNTER — Ambulatory Visit (HOSPITAL_BASED_OUTPATIENT_CLINIC_OR_DEPARTMENT_OTHER): Payer: BC Managed Care – PPO | Admitting: Adult Health

## 2012-08-04 ENCOUNTER — Ambulatory Visit: Payer: BC Managed Care – PPO

## 2012-08-04 ENCOUNTER — Other Ambulatory Visit (HOSPITAL_BASED_OUTPATIENT_CLINIC_OR_DEPARTMENT_OTHER): Payer: BC Managed Care – PPO | Admitting: Lab

## 2012-08-04 ENCOUNTER — Other Ambulatory Visit: Payer: BC Managed Care – PPO | Admitting: Lab

## 2012-08-04 ENCOUNTER — Ambulatory Visit (HOSPITAL_BASED_OUTPATIENT_CLINIC_OR_DEPARTMENT_OTHER): Payer: BC Managed Care – PPO

## 2012-08-04 VITALS — BP 150/70 | HR 69 | Temp 98.0°F | Resp 20 | Ht 62.0 in | Wt 159.5 lb

## 2012-08-04 DIAGNOSIS — Z5112 Encounter for antineoplastic immunotherapy: Secondary | ICD-10-CM

## 2012-08-04 DIAGNOSIS — C50219 Malignant neoplasm of upper-inner quadrant of unspecified female breast: Secondary | ICD-10-CM

## 2012-08-04 LAB — CBC WITH DIFFERENTIAL/PLATELET
Basophils Absolute: 0 10*3/uL (ref 0.0–0.1)
Eosinophils Absolute: 0 10*3/uL (ref 0.0–0.5)
HGB: 10.8 g/dL — ABNORMAL LOW (ref 11.6–15.9)
LYMPH%: 20.1 % (ref 14.0–49.7)
MCV: 89 fL (ref 79.5–101.0)
MONO#: 1.5 10*3/uL — ABNORMAL HIGH (ref 0.1–0.9)
MONO%: 11.3 % (ref 0.0–14.0)
NEUT#: 9.2 10*3/uL — ABNORMAL HIGH (ref 1.5–6.5)
Platelets: 197 10*3/uL (ref 145–400)
RDW: 13.8 % (ref 11.2–14.5)
WBC: 13.4 10*3/uL — ABNORMAL HIGH (ref 3.9–10.3)

## 2012-08-04 LAB — COMPREHENSIVE METABOLIC PANEL (CC13)
ALT: 25 U/L (ref 0–55)
AST: 16 U/L (ref 5–34)
Alkaline Phosphatase: 79 U/L (ref 40–150)
Sodium: 141 mEq/L (ref 136–145)
Total Bilirubin: 0.53 mg/dL (ref 0.20–1.20)
Total Protein: 7.3 g/dL (ref 6.4–8.3)

## 2012-08-04 MED ORDER — DEXAMETHASONE SODIUM PHOSPHATE 4 MG/ML IJ SOLN
20.0000 mg | Freq: Once | INTRAMUSCULAR | Status: AC
  Administered 2012-08-04: 20 mg via INTRAVENOUS

## 2012-08-04 MED ORDER — SODIUM CHLORIDE 0.9 % IV SOLN
Freq: Once | INTRAVENOUS | Status: AC
  Administered 2012-08-04: 13:00:00 via INTRAVENOUS

## 2012-08-04 MED ORDER — TRASTUZUMAB CHEMO INJECTION 440 MG
2.0000 mg/kg | Freq: Once | INTRAVENOUS | Status: AC
  Administered 2012-08-04: 147 mg via INTRAVENOUS
  Filled 2012-08-04: qty 7

## 2012-08-04 MED ORDER — HEPARIN SOD (PORK) LOCK FLUSH 100 UNIT/ML IV SOLN
500.0000 [IU] | Freq: Once | INTRAVENOUS | Status: AC | PRN
  Administered 2012-08-04: 500 [IU]
  Filled 2012-08-04: qty 5

## 2012-08-04 MED ORDER — DOCETAXEL CHEMO INJECTION 160 MG/16ML
75.0000 mg/m2 | Freq: Once | INTRAVENOUS | Status: AC
  Administered 2012-08-04: 130 mg via INTRAVENOUS
  Filled 2012-08-04: qty 13

## 2012-08-04 MED ORDER — SODIUM CHLORIDE 0.9 % IJ SOLN
10.0000 mL | INTRAMUSCULAR | Status: DC | PRN
  Administered 2012-08-04: 10 mL
  Filled 2012-08-04: qty 10

## 2012-08-04 MED ORDER — DIPHENHYDRAMINE HCL 25 MG PO CAPS
50.0000 mg | ORAL_CAPSULE | Freq: Once | ORAL | Status: AC
  Administered 2012-08-04: 50 mg via ORAL

## 2012-08-04 MED ORDER — SODIUM CHLORIDE 0.9 % IV SOLN
460.0000 mg | Freq: Once | INTRAVENOUS | Status: AC
  Administered 2012-08-04: 460 mg via INTRAVENOUS
  Filled 2012-08-04: qty 46

## 2012-08-04 MED ORDER — ACETAMINOPHEN 325 MG PO TABS
650.0000 mg | ORAL_TABLET | Freq: Once | ORAL | Status: AC
  Administered 2012-08-04: 650 mg via ORAL

## 2012-08-04 MED ORDER — ONDANSETRON 16 MG/50ML IVPB (CHCC)
16.0000 mg | Freq: Once | INTRAVENOUS | Status: AC
  Administered 2012-08-04: 16 mg via INTRAVENOUS

## 2012-08-04 NOTE — Patient Instructions (Addendum)
Doing well, proceed with chemotherapy.  Please call us if you have any questions or concerns.    

## 2012-08-04 NOTE — Telephone Encounter (Signed)
Pt given appt schedule while in inf.

## 2012-08-04 NOTE — Patient Instructions (Signed)
[facility] Discharge Instructions for Patients Receiving Chemotherapy  Today you received the following chemotherapy agents Taxotere/Carboplatin/Herceptin To help prevent nausea and vomiting after your treatment, we encourage you to take your nausea medication as prescribed.  If you develop nausea and vomiting that is not controlled by your nausea medication, call the clinic. If it is after clinic hours your family physician or the after hours number for the clinic or go to the Emergency Department.   BELOW ARE SYMPTOMS THAT SHOULD BE REPORTED IMMEDIATELY:  *FEVER GREATER THAN 100.5 F  *CHILLS WITH OR WITHOUT FEVER  NAUSEA AND VOMITING THAT IS NOT CONTROLLED WITH YOUR NAUSEA MEDICATION  *UNUSUAL SHORTNESS OF BREATH  *UNUSUAL BRUISING OR BLEEDING  TENDERNESS IN MOUTH AND THROAT WITH OR WITHOUT PRESENCE OF ULCERS  *URINARY PROBLEMS  *BOWEL PROBLEMS  UNUSUAL RASH Items with * indicate a potential emergency and should be followed up as soon as possible.  One of the nurses will contact you 24 hours after your treatment. Please let the nurse know about any problems that you may have experienced. Feel free to call the clinic you have any questions or concerns. The clinic phone number is [phone].   I have been informed and understand all the instructions given to me. I know to contact the clinic, my physician, or go to the Emergency Department if any problems should occur. I do not have any questions at this time, but understand that I may call the clinic during office hours   should I have any questions or need assistance in obtaining follow up care.    __________________________________________  _____________  __________ Signature of Patient or Authorized Representative            Date                   Time    __________________________________________ Nurse's Signature

## 2012-08-04 NOTE — Progress Notes (Signed)
OFFICE PROGRESS NOTE  CCGeorgann Housekeeper, MD [address] [place] [address]  DIAGNOSIS: 67 year old female with invasive ductal carcinoma that was ER +100% PR +100% HER-2/neu positive with a ratio of 2.45. Ki-67 was 55% and elevated tumor was grade 2.  She had a lumpectomy and sentinel node biopsy revealing T1CN1A, stage IIA breast cancer of the left breast.       PRIOR THERAPY: 1. She noted a left breast mass on self throat examination. She underwent a mammogram in December that showed the mass. She went on to have it biopsied on 05/14/2012. The pathology revealed invasive ductal carcinoma that was ER +100% PR +100% HER-2/neu positive with a ratio of 2.45. Ki-67 was 55% and elevated tumor was grade 2. She had an MRI performed that showed the area to be measuring out at 1.6 cm  2. She underwent a lumpectomy with sentinel node biopsy on 06/10/12.  revealing T1CN1A, stage IIA breast cancer of the left breast.  3. She started chemotherapy consisting of TCH on 07/14/12 with 6 cycles planned.    CURRENT THERAPY: TCH C 2 day 1 with weekly herceptin  INTERVAL HISTORY: [name] 67 y.o. female returns for follow up today.  She is feeling well today.   She says that this past week has been the best ever since chemotherapy started.  She went to her PCP and got Sanctura prescribed for her bladder incontinence.  She hasn't tried any yet, she wanted to make sure that these were okay to take with her chemotherapy regimen first.   MEDICAL HISTORY: Past Medical History  Diagnosis Date  . Hyperlipidemia   . Invasive ductal carcinoma of breast 2013    Left  . Arthritis   . Depression   . Heart murmur     heard one when she was pregnamt-maybe had echo-cannot remember  . Hypertension   . GERD (gastroesophageal reflux disease)     ALLERGIES:  has No Known Allergies.  MEDICATIONS:  Current Outpatient Prescriptions  Medication Sig Dispense Refill  . Ascorbic Acid  (VITAMIN C) 1000 MG tablet Take 1,000 mg by mouth daily.        Marland Kitchen aspirin 325 MG tablet Take 325 mg by mouth daily.        Marland Kitchen atenolol (TENORMIN) 50 MG tablet Take 1 tablet by mouth daily.       . Cholecalciferol (VITAMIN D) 2000 UNITS CAPS Take 1 capsule by mouth daily.        . citalopram (CELEXA) 10 MG tablet Take 10 mg by mouth daily.       Marland Kitchen dexamethasone (DECADRON) 4 MG tablet Take 2 tablets (8 mg total) by mouth 2 (two) times daily with a meal. Take two times a day the day before Taxotere. Then take two times a day starting the day after chemo for 3 days.  30 tablet  1  . lidocaine-prilocaine (EMLA) cream Apply topically as needed.  30 g  7  . LORazepam (ATIVAN) 0.5 MG tablet Take 1 tablet (0.5 mg total) by mouth every 6 (six) hours as needed (Nausea or vomiting).  30 tablet  0  . olmesartan-hydrochlorothiazide (BENICAR HCT) 40-12.5 MG per tablet Take 1 tablet by mouth daily.      Marland Kitchen omeprazole (PRILOSEC) 40 MG capsule Take 1 tablet by mouth Daily.      . ondansetron (ZOFRAN) 8 MG tablet Take 1 tablet (8 mg total) by mouth 2 (two) times daily. Take two times a  day starting the day after chemo for 3 days. Then take two times a day as needed for nausea or vomiting.  30 tablet  1  . oxyCODONE-acetaminophen (PERCOCET/ROXICET) 5-325 MG per tablet Take 1-2 tablets by mouth every 6 (six) hours as needed.  40 tablet  0  . prochlorperazine (COMPAZINE) 10 MG tablet Take 1 tablet (10 mg total) by mouth every 6 (six) hours as needed (Nausea or vomiting).  30 tablet  1  . prochlorperazine (COMPAZINE) 25 MG suppository Place 1 suppository (25 mg total) rectally every 12 (twelve) hours as needed for nausea.  12 suppository  3  . simvastatin (ZOCOR) 80 MG tablet Take 40 mg by mouth at bedtime.       . Trospium Chloride 60 MG CP24 Take by mouth daily.       . vitamin E 400 UNIT capsule Take 400 Units by mouth daily.         No current facility-administered medications for this visit.   Facility-Administered  Medications Ordered in Other Visits  Medication Dose Route Frequency Provider Last Rate Last Dose  . CARBOplatin (PARAPLATIN) 460 mg in sodium chloride 0.9 % 250 mL chemo infusion  460 mg Intravenous Once Victorino December, MD      . DOCEtaxel (TAXOTERE) 130 mg in dextrose 5 % 250 mL chemo infusion  75 mg/m2 (Treatment Plan Actual) Intravenous Once Victorino December, MD      . heparin lock flush 100 unit/mL  500 Units Intracatheter Once PRN Victorino December, MD      . sodium chloride 0.9 % injection 10 mL  10 mL Intracatheter PRN Victorino December, MD        SURGICAL HISTORY:  Past Surgical History  Procedure Laterality Date  . Tubal ligation  1988  . Bladder suspension  10/2010  . Tonsillectomy and adenoidectomy      age 58  . Abdominal adhesion surgery  1980  . Benign breast biopsy  1994    Left  . Breast lumpectomy with needle localization and axillary sentinel lymph node bx  06/10/2012    Procedure: BREAST LUMPECTOMY WITH NEEDLE LOCALIZATION AND AXILLARY SENTINEL LYMPH NODE BX;  Surgeon: Emelia Loron, MD;  Location: [facility];  Service: General;  Laterality: Left;  . Appendectomy    . Cholecystectomy    . Portacath placement  07/07/2012    Procedure: INSERTION PORT-A-CATH;  Surgeon: Emelia Loron, MD;  Location: [facility];  Service: General;  Laterality: Left;  Port a cath insertion    REVIEW OF SYSTEMS:    General: fatigue (-), night sweats (-), fever (-), pain (-) Lymph: palpable nodes (-) HEENT: vision changes (-), mucositis (-), gum bleeding (-), epistaxis (-) Cardiovascular: chest pain (-), palpitations (-) Pulmonary: shortness of breath (-), dyspnea on exertion (-), cough (-), hemoptysis (-) GI:  Early satiety (-), melena (-), dysphagia (-), nausea/vomiting (-), diarrhea (-) GU: dysuria (-), hematuria (-), incontinence (-) Musculoskeletal: joint swelling (-), joint pain (-), back pain (-) Neuro: weakness (-), numbness (-), headache (-), confusion  (-) Skin: Rash (-), lesions (-), dryness (-) Psych: depression (-), suicidal/homicidal ideation (-), feeling of hopelessness (-)   PHYSICAL EXAMINATION: Blood pressure 150/70, pulse 69, temperature 98 F (36.7 C), temperature source Oral, resp. rate 20, height 5\' 2"  (1.575 m), weight 159 lb 8 oz (72.349 kg). Body mass index is 29.17 kg/(m^2). General: Patient is a well appearing female in no acute distress HEENT: PERRLA, sclerae anicteric no conjunctival  pallor, MMM Neck: supple, no palpable adenopathy Lungs: clear to auscultation bilaterally, no wheezes, rhonchi, or rales Cardiovascular: regular rate rhythm, S1, S2,  Systolic murmur, no rubs or gallops Abdomen: Soft, non-tender, non-distended, normoactive bowel sounds, no HSM Extremities: warm and well perfused, no clubbing, cyanosis, or edema Skin: No rashes or lesions Neuro: Non-focal Breasts:  Left breast incision site well healed, no nodularity, right breast no masses or nodules.  ECOG PERFORMANCE STATUS: 1 - Symptomatic but completely ambulatory      LABORATORY DATA: Lab Results  Component Value Date   WBC 13.4* 08/04/2012   HGB 10.8* 08/04/2012   HCT 32.5* 08/04/2012   MCV 89.0 08/04/2012   PLT 197 08/04/2012      Chemistry      Component Value Date/Time   NA 141 08/04/2012 1129   NA 139 07/09/2012 0535   K 3.8 08/04/2012 1129   K 4.2 07/09/2012 0535   CL 104 08/04/2012 1129   CL 106 07/09/2012 0535   CO2 26 08/04/2012 1129   CO2 26 07/09/2012 0535   BUN 30.5* 08/04/2012 1129   BUN 21 07/09/2012 0535   CREATININE 1.0 08/04/2012 1129   CREATININE 1.13* 07/09/2012 0535      Component Value Date/Time   CALCIUM 10.2 08/04/2012 1129   CALCIUM 8.8 07/09/2012 0535   ALKPHOS 79 08/04/2012 1129   ALKPHOS 65 06/08/2012 0924   AST 16 08/04/2012 1129   AST 22 06/08/2012 0924   ALT 25 08/04/2012 1129   ALT 21 06/08/2012 0924   BILITOT 0.53 08/04/2012 1129   BILITOT 0.5 06/08/2012 0924     FINAL DIAGNOSIS Diagnosis 1. Breast, lumpectomy, Left - INVASIVE DUCTAL  CARCINOMA (1.2CM) SEE COMMENT - LYMPHOVASCULAR INVASION IDENTIFIED. - INVASIVE TUMOR IS LESS THAN 0.1 MM FROM NEAREST MARGIN (ANTERIOR). - LYMPHOVASCULAR INVASION IDENTIFIED. - DUCTAL CARCINOMA IN SITU WITH CALCIFICATIONS AND COMEDO NECROSIS. - IN SITU CARCINOMA IS PRESENT AT ANTERIOR MARGIN. - SEE TUMOR SYNOPTIC TEMPLATE BELOW. 2. Lymph node, sentinel, biopsy, Left - ONE LYMPH NODE, POSITIVE FOR METASTATIC MAMMARY CARCINOMA (1/1) - TUMOR DEPOSIT IS 1.1CM - NO EXTRACAPSULAR TUMOR EXTENSION PRESENT. Microscopic Comment 1. BREAST, INVASIVE TUMOR, WITH LYMPH NODE SAMPLING Specimen, including laterality: Left breast. Procedure: Lumpectomy. Grade: II of III Tubule formation: 3 Nuclear pleomorphism: 2 Mitotic:1 Tumor size (gross measurement): 1.2 cm Margins: Invasive, distance to closest margin: Less than 0.1 mm, see comment. In-situ, distance to closest margin: Present at anterior margin. If margin positive, focally or broadly: Focally Lymphovascular invasion: Present. Ductal carcinoma in situ: Present Grade: III of III Extensive intraductal component: Absent. 1 of 3 FINAL for TWINKLE, SOCKWELL ([id number]) Microscopic Comment(continued) Lobular neoplasia: Absent. Tumor focality: Unifocal. Treatment effect: None. If present, treatment effect in breast tissue, lymph nodes or both: N/A Extent of tumor: Skin: Negative for tumor. Nipple: N/A Skeletal muscle: N/A Lymph nodes: # examined: 1 Lymph nodes with metastasis: 1 Macrometastasis: (> 2.0 mm): 1.1 cm Extracapsular extension: Absent. Breast prognostic profile: Estrogen receptor: Not repeated, previous study demonstrated 100% positivity ([id number]) Progesterone receptor: Not repeated, previous study demonstrated 100% positivity ([id number]) Her 2 neu: Repeated, previous study demonstrated amplification at (2.45) ([id number]). Ki-67: Not repeated, previous study demonstrated 55% proliferation rate. Non-neoplastic breast:  Previous biopsy site, vascular calcifications, benign fibrocytic change, and microcalcifications in benign ducts and lobules. TNM: pT1c, pN1a, pMX Comments: Although invasive tumor extends into cauterized tissue, there is no invasive tumor definitively identified at the cauterized edge (slides 1A-1C). (BJS:gt, 06/14/12) [place] RUND DO Pathologist, Electronic Signature (  Case signed 06/14/2012) Specimen Gross and Clinical Information Specimen(s) Obtained: 1. Breast, lumpectomy, Left 2. Lymph node, sentinel, biopsy, Left Specimen Clinical Information  RADIOGRAPHIC STUDIES:   ASSESSMENT:  67 year old female with  #1 new diagnosis of 1.6 cm (by MRI) invasive ductal carcinoma grade 2 ER +100% PR +100% HER-2/neu amplified with a ratio of 2.45 with an elevated Ki-67 of 55%. Patient is status post biopsy on 05/14/2012. Patient found a mass on self breast examination. Patient was seen by Dr. Harden Mo and he feels that the patient is a breast conservation candidate with lumpectomy and sentinel lymph node biopsy. She is now being seen for discussion of adjuvant treatment post lumpectomy.   #2 patient and I discussed her pathology in detail. We discussed the significance of grade as well as elevated Ki-67 and HER-2/neu positivity. We discussed use of HER-2 based treatment such as with Herceptin and chemotherapy. We discussed combination Herceptin and chemotherapy consisting of Taxotere carboplatinum and Herceptin. The Taxotere and carboplatinum would be given every 3 weeks for a total of 6 cycles. Herceptin would be given initially weekly with her chemotherapy and then changed to every 3 weeks to finish out one year of HER-2 therapy.   #3 patient certainly will need radiation therapy and she will be seen by Dr. Doristine Devoid.   #4 because patient's tumor is estrogen receptor positive she would also received adjuvant antiestrogen therapy in the form of an aromatase inhibitors and she is postmenopausal.  We discussed the different aromatase inhibitors.    PLAN:   1. Doing well. Patient will proceed with chemotherapy today.  Her labs are stable.  2. I cleared her to go ahead and start taking the Sanctura prescribed by her PCP for her bladder control.   3. I will see her back next week for her weekly Herceptin.     All questions were answered. The patient knows to call the clinic with any problems, questions or concerns. We can certainly see the patient much sooner if necessary.  I spent 15 minutes counseling the patient face to face. The total time spent in the appointment was 30 minutes.   Cherie Ouch Lyn Hollingshead, NP Medical Oncology [facility] Phone: [phone] 08/04/2012, 2:33 PM

## 2012-08-05 ENCOUNTER — Ambulatory Visit (HOSPITAL_BASED_OUTPATIENT_CLINIC_OR_DEPARTMENT_OTHER): Payer: BC Managed Care – PPO

## 2012-08-05 VITALS — BP 130/70 | HR 75 | Temp 98.0°F

## 2012-08-05 DIAGNOSIS — Z5189 Encounter for other specified aftercare: Secondary | ICD-10-CM

## 2012-08-05 DIAGNOSIS — C50219 Malignant neoplasm of upper-inner quadrant of unspecified female breast: Secondary | ICD-10-CM

## 2012-08-05 MED ORDER — PEGFILGRASTIM INJECTION 6 MG/0.6ML
6.0000 mg | Freq: Once | SUBCUTANEOUS | Status: AC
  Administered 2012-08-05: 6 mg via SUBCUTANEOUS
  Filled 2012-08-05: qty 0.6

## 2012-08-09 ENCOUNTER — Ambulatory Visit (INDEPENDENT_AMBULATORY_CARE_PROVIDER_SITE_OTHER): Payer: BC Managed Care – PPO | Admitting: Emergency Medicine

## 2012-08-09 ENCOUNTER — Encounter: Payer: Self-pay | Admitting: Emergency Medicine

## 2012-08-09 ENCOUNTER — Encounter (HOSPITAL_COMMUNITY): Payer: Self-pay

## 2012-08-09 ENCOUNTER — Telehealth: Payer: Self-pay | Admitting: *Deleted

## 2012-08-09 ENCOUNTER — Emergency Department (HOSPITAL_COMMUNITY): Payer: BC Managed Care – PPO

## 2012-08-09 ENCOUNTER — Emergency Department (HOSPITAL_COMMUNITY)
Admission: EM | Admit: 2012-08-09 | Discharge: 2012-08-09 | Disposition: A | Discharge status: Home / Self Care | Payer: BC Managed Care – PPO | Attending: Emergency Medicine | Admitting: Emergency Medicine

## 2012-08-09 VITALS — BP 108/60 | HR 83 | Resp 16 | Ht 62.0 in | Wt 159.0 lb

## 2012-08-09 DIAGNOSIS — R079 Chest pain, unspecified: Secondary | ICD-10-CM | POA: Insufficient documentation

## 2012-08-09 DIAGNOSIS — Z79899 Other long term (current) drug therapy: Secondary | ICD-10-CM | POA: Insufficient documentation

## 2012-08-09 DIAGNOSIS — I10 Essential (primary) hypertension: Secondary | ICD-10-CM | POA: Insufficient documentation

## 2012-08-09 DIAGNOSIS — IMO0002 Reserved for concepts with insufficient information to code with codable children: Secondary | ICD-10-CM | POA: Insufficient documentation

## 2012-08-09 DIAGNOSIS — F3289 Other specified depressive episodes: Secondary | ICD-10-CM | POA: Insufficient documentation

## 2012-08-09 DIAGNOSIS — Z7982 Long term (current) use of aspirin: Secondary | ICD-10-CM | POA: Insufficient documentation

## 2012-08-09 DIAGNOSIS — E785 Hyperlipidemia, unspecified: Secondary | ICD-10-CM | POA: Insufficient documentation

## 2012-08-09 DIAGNOSIS — R110 Nausea: Secondary | ICD-10-CM | POA: Insufficient documentation

## 2012-08-09 DIAGNOSIS — M129 Arthropathy, unspecified: Secondary | ICD-10-CM | POA: Insufficient documentation

## 2012-08-09 DIAGNOSIS — C50919 Malignant neoplasm of unspecified site of unspecified female breast: Secondary | ICD-10-CM | POA: Insufficient documentation

## 2012-08-09 DIAGNOSIS — K219 Gastro-esophageal reflux disease without esophagitis: Secondary | ICD-10-CM | POA: Insufficient documentation

## 2012-08-09 DIAGNOSIS — Z8679 Personal history of other diseases of the circulatory system: Secondary | ICD-10-CM | POA: Insufficient documentation

## 2012-08-09 DIAGNOSIS — Z9221 Personal history of antineoplastic chemotherapy: Secondary | ICD-10-CM | POA: Insufficient documentation

## 2012-08-09 LAB — BASIC METABOLIC PANEL
BUN: 48 mg/dL — ABNORMAL HIGH (ref 6–23)
CO2: 24 mEq/L (ref 19–32)
Calcium: 9.3 mg/dL (ref 8.4–10.5)
Chloride: 103 mEq/L (ref 96–112)
Creatinine, Ser: 1.13 mg/dL — ABNORMAL HIGH (ref 0.50–1.10)
GFR calc Af Amer: 57 mL/min — ABNORMAL LOW (ref >90)
GFR calc non Af Amer: 49 mL/min — ABNORMAL LOW (ref >90)
Glucose, Bld: 136 mg/dL — ABNORMAL HIGH (ref 70–99)
Potassium: 4.2 mEq/L (ref 3.5–5.1)
Sodium: 137 mEq/L (ref 135–145)

## 2012-08-09 LAB — CBC
HCT: 32.4 % — ABNORMAL LOW (ref 36.0–46.0)
Hemoglobin: 11.3 g/dL — ABNORMAL LOW (ref 12.0–15.0)
MCH: 30.5 pg (ref 26.0–34.0)
MCHC: 34.9 g/dL (ref 30.0–36.0)
MCV: 87.3 fL (ref 78.0–100.0)
Platelets: 143 10*3/uL — ABNORMAL LOW (ref 150–400)
RBC: 3.71 MIL/uL — ABNORMAL LOW (ref 3.87–5.11)
RDW: 14.3 % (ref 11.5–15.5)
WBC: 5.4 10*3/uL (ref 4.0–10.5)

## 2012-08-09 LAB — TROPONIN I: Troponin I: <0.3 ng/mL (ref <0.30)

## 2012-08-09 MED ORDER — SODIUM CHLORIDE 0.9 % IJ SOLN
10.0000 mL | INTRAMUSCULAR | Status: DC | PRN
  Administered 2012-08-09: 10 mL

## 2012-08-09 MED ORDER — HEPARIN SOD (PORK) LOCK FLUSH 100 UNIT/ML IV SOLN
500.0000 [IU] | Freq: Once | INTRAVENOUS | Status: AC
  Administered 2012-08-09: 500 [IU] via INTRAVENOUS

## 2012-08-09 NOTE — Progress Notes (Signed)
[facility] [address] [place] [address] [phone]  Date:  08/09/2012   Name:  [name]   DOB:  January 07, 1946   MRN:  [id number]  PCP:  [name], MD    Chief Complaint: Chest Pain   History of Present Illness:  [name] is a 67 y.o. very pleasant female patient who presents with the following:  Had an episode of chest tightness this afternoon following a period of marked dizziness.  No sensation of rapid or irregular heart beat. No radiation of pain.  No shortness of breath.  No nausea or vomiting.  No diaphoresis.  History of elevated lipids and hypertension.  Both under treatment.  No diabetes.  Non smoker.  Post menopausal.  No premature cardiac disease in family. Currently under treatment for breast cancer with chemo.  Currently pain free.  Denies other complaint or health concern today. No improvement with over the counter medications or other home remedies.   Patient Active Problem List  Diagnosis  . Cancer of upper-outer quadrant of female breast  . Invasive ductal carcinoma of breast  . Arthritis  . Depression  . Heart murmur  . Hypertension  . Cancer of central portion of female breast  . Pneumothorax, left  . SOB (shortness of breath)  . Chest pain    Past Medical History  Diagnosis Date  . Hyperlipidemia   . Invasive ductal carcinoma of breast 2013    Left  . Arthritis   . Depression   . Heart murmur     heard one when she was pregnamt-maybe had echo-cannot remember  . Hypertension   . GERD (gastroesophageal reflux disease)     Past Surgical History  Procedure Laterality Date  . Tubal ligation  1988  . Bladder suspension  10/2010  . Tonsillectomy and adenoidectomy      age 6  . Abdominal adhesion surgery  1980  . Benign breast biopsy  1994    Left  . Breast lumpectomy with needle localization and axillary sentinel lymph node bx  06/10/2012    Procedure: BREAST LUMPECTOMY WITH NEEDLE LOCALIZATION AND  AXILLARY SENTINEL LYMPH NODE BX;  Surgeon: Emelia Loron, MD;  Location: [facility];  Service: General;  Laterality: Left;  . Appendectomy    . Cholecystectomy    . Portacath placement  07/07/2012    Procedure: INSERTION PORT-A-CATH;  Surgeon: Emelia Loron, MD;  Location: [facility];  Service: General;  Laterality: Left;  Port a cath insertion    History  Substance Use Topics  . Smoking status: Never Smoker   . Smokeless tobacco: Never Used  . Alcohol Use: 0.6 oz/week    1 Glasses of wine per week     Comment: 1 glass of Wine per Week    Family History  Problem Relation Age of Onset  . Colon cancer Mother 28  . Breast cancer Sister 44  . Prostate cancer Father 57  . Stomach cancer Maternal Grandmother 67    No Known Allergies  Medication list has been reviewed and updated.  Current Outpatient Prescriptions on File Prior to Visit  Medication Sig Dispense Refill  . Ascorbic Acid (VITAMIN C) 1000 MG tablet Take 1,000 mg by mouth daily.        Marland Kitchen aspirin 325 MG tablet Take 325 mg by mouth daily.        Marland Kitchen atenolol (TENORMIN) 50 MG tablet Take 1 tablet by mouth daily.       Marland Kitchen  Cholecalciferol (VITAMIN D) 2000 UNITS CAPS Take 1 capsule by mouth daily.        . citalopram (CELEXA) 10 MG tablet Take 10 mg by mouth daily.       Marland Kitchen dexamethasone (DECADRON) 4 MG tablet Take 2 tablets (8 mg total) by mouth 2 (two) times daily with a meal. Take two times a day the day before Taxotere. Then take two times a day starting the day after chemo for 3 days.  30 tablet  1  . lidocaine-prilocaine (EMLA) cream Apply topically as needed.  30 g  7  . LORazepam (ATIVAN) 0.5 MG tablet Take 1 tablet (0.5 mg total) by mouth every 6 (six) hours as needed (Nausea or vomiting).  30 tablet  0  . olmesartan-hydrochlorothiazide (BENICAR HCT) 40-12.5 MG per tablet Take 1 tablet by mouth daily.      Marland Kitchen omeprazole (PRILOSEC) 40 MG capsule Take 1 tablet by mouth Daily.      .  ondansetron (ZOFRAN) 8 MG tablet Take 1 tablet (8 mg total) by mouth 2 (two) times daily. Take two times a day starting the day after chemo for 3 days. Then take two times a day as needed for nausea or vomiting.  30 tablet  1  . oxyCODONE-acetaminophen (PERCOCET/ROXICET) 5-325 MG per tablet Take 1-2 tablets by mouth every 6 (six) hours as needed.  40 tablet  0  . prochlorperazine (COMPAZINE) 10 MG tablet Take 1 tablet (10 mg total) by mouth every 6 (six) hours as needed (Nausea or vomiting).  30 tablet  1  . prochlorperazine (COMPAZINE) 25 MG suppository Place 1 suppository (25 mg total) rectally every 12 (twelve) hours as needed for nausea.  12 suppository  3  . simvastatin (ZOCOR) 80 MG tablet Take 40 mg by mouth at bedtime.       . Trospium Chloride 60 MG CP24 Take by mouth daily.       . vitamin E 400 UNIT capsule Take 400 Units by mouth daily.         No current facility-administered medications on file prior to visit.    Review of Systems:  As per HPI, otherwise negative.    Physical Examination: Filed Vitals:   08/09/12 1610  BP: 108/60  Pulse: 83  Resp: 16   Filed Vitals:   08/09/12 1610  Height: 5\' 2"  (1.575 m)  Weight: 159 lb (72.122 kg)   Body mass index is 29.07 kg/(m^2). Ideal Body Weight: Weight in (lb) to have BMI = 25: 136.4  GEN: WDWN, NAD, Non-toxic, A & O x 3 HEENT: Atraumatic, Normocephalic. Neck supple. No masses, No LAD. Ears and Nose: No external deformity. CV: RRR, No M/G/R. No JVD. No thrill. No extra heart sounds. PULM: CTA B, no wheezes, crackles, rhonchi. No retractions. No resp. distress. No accessory muscle use. ABD: S, NT, ND, +BS. No rebound. No HSM. EXTR: No c/c/e NEURO Normal gait.  PSYCH: Normally interactive. Conversant. Not depressed or anxious appearing.  Calm demeanor.    Assessment and Plan: Chest pain Oxygen, IV Normal EKG To ER  Carmelina Dane, MD

## 2012-08-09 NOTE — ED Notes (Signed)
Pt back from radiology 

## 2012-08-09 NOTE — ED Notes (Signed)
IV Team at the bedside to access portacath for blood draw.

## 2012-08-09 NOTE — ED Notes (Signed)
Per [facility], pt from [facility] for c/o left sided chest pressure that started at 1300 today lasting about 30 minutes with some nausea. She does have Breast CA and had her last chemo trxt last week and reports some dizziness this past weekend which normally happens with her chemo treatments. 20g to RAC. Given 324 mg ASA. Denies any pain at present. 118/66, 80 RRR, 16 RR

## 2012-08-09 NOTE — ED Provider Notes (Signed)
History    67 year old female with chest pain. Onset of symptoms at approximately 1:00 this afternoon while sitting down and eating. Substernal. No radiation. Last about 30 minutes and gradually subsided. Associated with mild nausea. No diaphoresis or palpitations. No shortness of breath. Patient has a history of breast cancer and is currently undergoing chemotherapy. No history of blood clot. No unusual leg pain or swelling. No fever or chills. No diagnosed hx of CAD.   CSN: [id number]  Arrival date & time 08/09/12  1704   First MD Initiated Contact with Patient 08/09/12 1725      Chief Complaint  Patient presents with  . Chest Pain    (Consider location/radiation/quality/duration/timing/severity/associated sxs/prior treatment) HPI  Past Medical History  Diagnosis Date  . Hyperlipidemia   . Invasive ductal carcinoma of breast 2013    Left  . Arthritis   . Depression   . Heart murmur     heard one when she was pregnamt-maybe had echo-cannot remember  . Hypertension   . GERD (gastroesophageal reflux disease)     Past Surgical History  Procedure Laterality Date  . Tubal ligation  1988  . Bladder suspension  10/2010  . Tonsillectomy and adenoidectomy      age 15  . Abdominal adhesion surgery  1980  . Benign breast biopsy  1994    Left  . Breast lumpectomy with needle localization and axillary sentinel lymph node bx  06/10/2012    Procedure: BREAST LUMPECTOMY WITH NEEDLE LOCALIZATION AND AXILLARY SENTINEL LYMPH NODE BX;  Surgeon: Emelia Loron, MD;  Location: [facility];  Service: General;  Laterality: Left;  . Appendectomy    . Cholecystectomy    . Portacath placement  07/07/2012    Procedure: INSERTION PORT-A-CATH;  Surgeon: Emelia Loron, MD;  Location: [facility];  Service: General;  Laterality: Left;  Port a cath insertion    Family History  Problem Relation Age of Onset  . Colon cancer Mother 69  . Breast cancer Sister 62  .  Prostate cancer Father 97  . Stomach cancer Maternal Grandmother 22    History  Substance Use Topics  . Smoking status: Never Smoker   . Smokeless tobacco: Never Used  . Alcohol Use: 0.6 oz/week    1 Glasses of wine per week     Comment: 1 glass of Wine per Week    OB History   Grav Para Term Preterm Abortions TAB SAB Ect Mult Living                  Review of Systems  All systems reviewed and negative, other than as noted in HPI.   Allergies  Review of patient's allergies indicates no known allergies.  Home Medications   Current Outpatient Rx  Name  Route  Sig  Dispense  Refill  . Ascorbic Acid (VITAMIN C) 1000 MG tablet   Oral   Take 1,000 mg by mouth daily.           Marland Kitchen aspirin 325 MG tablet   Oral   Take 325 mg by mouth daily.           Marland Kitchen atenolol (TENORMIN) 50 MG tablet   Oral   Take 50 mg by mouth daily.         . Cholecalciferol (VITAMIN D) 2000 UNITS tablet   Oral   Take 2,000 Units by mouth daily.         . citalopram (CELEXA) 10 MG tablet  Oral   Take 10 mg by mouth daily.          Marland Kitchen dexamethasone (DECADRON) 4 MG tablet   Oral   Take 2 tablets (8 mg total) by mouth 2 (two) times daily with a meal. Take two times a day the day before Taxotere. Then take two times a day starting the day after chemo for 3 days.   30 tablet   1   . lidocaine-prilocaine (EMLA) cream   Topical   Apply 1 application topically as needed (used for chemo).         . LORazepam (ATIVAN) 0.5 MG tablet   Oral   Take 1 tablet (0.5 mg total) by mouth every 6 (six) hours as needed (Nausea or vomiting).   30 tablet   0   . olmesartan-hydrochlorothiazide (BENICAR HCT) 40-12.5 MG per tablet   Oral   Take 1 tablet by mouth daily.         Marland Kitchen omeprazole (PRILOSEC) 40 MG capsule   Oral   Take 40 mg by mouth daily.         . ondansetron (ZOFRAN) 8 MG tablet   Oral   Take 1 tablet (8 mg total) by mouth 2 (two) times daily. Take two times a day starting the  day after chemo for 3 days. Then take two times a day as needed for nausea or vomiting.   30 tablet   1   . oxyCODONE-acetaminophen (PERCOCET/ROXICET) 5-325 MG per tablet   Oral   Take 1-2 tablets by mouth every 6 (six) hours as needed for pain.         Marland Kitchen prochlorperazine (COMPAZINE) 10 MG tablet   Oral   Take 1 tablet (10 mg total) by mouth every 6 (six) hours as needed (Nausea or vomiting).   30 tablet   1   . prochlorperazine (COMPAZINE) 25 MG suppository   Rectal   Place 1 suppository (25 mg total) rectally every 12 (twelve) hours as needed for nausea.   12 suppository   3   . simvastatin (ZOCOR) 80 MG tablet   Oral   Take 40 mg by mouth at bedtime.          . Trospium Chloride 60 MG CP24   Oral   Take 60 mg by mouth daily.         . vitamin E 400 UNIT capsule   Oral   Take 400 Units by mouth daily.             BP 100/53  Pulse 81  Temp(Src) 97.7 F (36.5 C) (Oral)  Resp 19  SpO2 99%  Physical Exam  Nursing note and vitals reviewed. Constitutional: She appears well-developed and well-nourished. No distress.  HENT:  Head: Normocephalic and atraumatic.  Eyes: Conjunctivae are normal. Right eye exhibits no discharge. Left eye exhibits no discharge.  Neck: Neck supple.  Cardiovascular: Normal rate, regular rhythm and normal heart sounds.  Exam reveals no gallop and no friction rub.   No murmur heard. Pulmonary/Chest: Effort normal and breath sounds normal. No respiratory distress.  Abdominal: Soft. She exhibits no distension. There is no tenderness.  Musculoskeletal: She exhibits no edema and no tenderness.  Lower extremities symmetric as compared to each other. No calf tenderness. Negative Homan's. No palpable cords.   Neurological: She is alert.  Skin: Skin is warm and dry.  Psychiatric: She has a normal mood and affect. Her behavior is normal. Thought content normal.    ED  Course  Procedures (including critical care time)  Labs Reviewed  CBC  - Abnormal; Notable for the following:    RBC 3.71 (*)    Hemoglobin 11.3 (*)    HCT 32.4 (*)    Platelets 143 (*)    All other components within normal limits  BASIC METABOLIC PANEL - Abnormal; Notable for the following:    Glucose, Bld 136 (*)    BUN 48 (*)    Creatinine, Ser 1.13 (*)    GFR calc non Af Amer 49 (*)    GFR calc Af Amer 57 (*)    All other components within normal limits  TROPONIN I   Dg Chest 2 View  08/09/2012  *RADIOLOGY REPORT*  Clinical Data: Chest pain, chemotherapy, blurred vision, nausea, headache, breast cancer, hypertension  CHEST - 2 VIEW  Comparison: 07/09/2012  Findings: Left subclavian Port-A-Cath stable with tip projecting over SVC. Upper-normal size of cardiac silhouette. Mediastinal contours and pulmonary vascularity normal. Mild chronic peribronchial thickening. No pulmonary infiltrate, pleural effusion, or pneumothorax. Bones appear demineralized. Surgical clips left breast. Additional surgical clips right upper quadrant question cholecystectomy.  IMPRESSION: Mild chronic bronchitic changes.   Original Report Authenticated By: Ulyses Southward, M.D.    EKG:  Rhythm: Normal sinus rate Rate: 79 Intervals: Normal Left ventricular hypertrophy. Inferior Q waves ST segments: Nonspecific ST changes    1. Chest pain       MDM  67 year old female with chest pain. Pain resolved. Troponin drawn about 7 hours after resolution of symptoms negative. No recurrent pain. EKG with no acute ischemic changes. Chest x-ray fairly unremarkable. Doubt PE or dissection. I feel she is safe for DC at this time. Emergent return precautions discussed.        Raeford Razor, MD 08/10/12 [phone]

## 2012-08-09 NOTE — Telephone Encounter (Signed)
PT. "PASSSED OUT" THIS MORNING. THIS AFTERNOON PT. LOSS CONSCIOUSNESS FOR A PERIOD OF TIME. SHE COMPLAINED OF CHEST PRESSURE AT A SCALE OF THREE TO FOUR. NOW CHEST PRESSURE IS AT A ONE. B/P IS 100/43. PT. DOES NOT WANT TO GO TO THE EMERGENCY ROOM BUT WOULD SEE DR.KHAN. VERBAL ORDER AND READ BACK TO LINDSEY ALEXANDER,NP- PT. NEEDS TO GO TO THE EMERGENCY ROOM TO BE EVALUATED. THESE EPISODES COULD BE CARDIAC RELATED. NOTIFIED SUE STEPHENS OF THE ABOVE INSTRUCTIONS. SHE WILL STRESS THE IMPORTANCE OF FOLLOWING THESE INSTRUCTIONS TO THE PT.

## 2012-08-11 ENCOUNTER — Ambulatory Visit (HOSPITAL_BASED_OUTPATIENT_CLINIC_OR_DEPARTMENT_OTHER): Payer: BC Managed Care – PPO

## 2012-08-11 ENCOUNTER — Other Ambulatory Visit (HOSPITAL_BASED_OUTPATIENT_CLINIC_OR_DEPARTMENT_OTHER): Payer: BC Managed Care – PPO | Admitting: Lab

## 2012-08-11 DIAGNOSIS — Z5112 Encounter for antineoplastic immunotherapy: Secondary | ICD-10-CM

## 2012-08-11 DIAGNOSIS — C50419 Malignant neoplasm of upper-outer quadrant of unspecified female breast: Secondary | ICD-10-CM

## 2012-08-11 LAB — CBC WITH DIFFERENTIAL/PLATELET
BASO%: 0.4 % (ref 0.0–2.0)
Basophils Absolute: 0 10*3/uL (ref 0.0–0.1)
EOS%: 1.2 % (ref 0.0–7.0)
HGB: 10.4 g/dL — ABNORMAL LOW (ref 11.6–15.9)
MCH: 29.5 pg (ref 25.1–34.0)
MCV: 90.7 fL (ref 79.5–101.0)
MONO%: 6.7 % (ref 0.0–14.0)
NEUT#: 2.9 10*3/uL (ref 1.5–6.5)
RBC: 3.53 10*6/uL — ABNORMAL LOW (ref 3.70–5.45)
RDW: 14.2 % (ref 11.2–14.5)
lymph#: 1.6 10*3/uL (ref 0.9–3.3)
nRBC: 0 % (ref 0–0)

## 2012-08-11 LAB — COMPREHENSIVE METABOLIC PANEL (CC13)
ALT: 24 U/L (ref 0–55)
AST: 16 U/L (ref 5–34)
Albumin: 3.3 g/dL — ABNORMAL LOW (ref 3.5–5.0)
Alkaline Phosphatase: 87 U/L (ref 40–150)
Glucose: 122 mg/dl — ABNORMAL HIGH (ref 70–99)
Potassium: 4.3 mEq/L (ref 3.5–5.1)
Sodium: 138 mEq/L (ref 136–145)
Total Bilirubin: 0.52 mg/dL (ref 0.20–1.20)
Total Protein: 6.1 g/dL — ABNORMAL LOW (ref 6.4–8.3)

## 2012-08-11 MED ORDER — TRASTUZUMAB CHEMO INJECTION 440 MG
2.0000 mg/kg | Freq: Once | INTRAVENOUS | Status: AC
  Administered 2012-08-11: 147 mg via INTRAVENOUS
  Filled 2012-08-11: qty 7

## 2012-08-11 MED ORDER — ACETAMINOPHEN 325 MG PO TABS
650.0000 mg | ORAL_TABLET | Freq: Once | ORAL | Status: AC
  Administered 2012-08-11: 650 mg via ORAL

## 2012-08-11 MED ORDER — SODIUM CHLORIDE 0.9 % IJ SOLN
10.0000 mL | INTRAMUSCULAR | Status: DC | PRN
  Administered 2012-08-11: 10 mL
  Filled 2012-08-11: qty 10

## 2012-08-11 MED ORDER — HEPARIN SOD (PORK) LOCK FLUSH 100 UNIT/ML IV SOLN
500.0000 [IU] | Freq: Once | INTRAVENOUS | Status: AC | PRN
  Administered 2012-08-11: 500 [IU]
  Filled 2012-08-11: qty 5

## 2012-08-11 MED ORDER — DIPHENHYDRAMINE HCL 25 MG PO CAPS
50.0000 mg | ORAL_CAPSULE | Freq: Once | ORAL | Status: AC
  Administered 2012-08-11: 50 mg via ORAL

## 2012-08-11 MED ORDER — SODIUM CHLORIDE 0.9 % IV SOLN
Freq: Once | INTRAVENOUS | Status: AC
  Administered 2012-08-11: 10:00:00 via INTRAVENOUS

## 2012-08-11 NOTE — Patient Instructions (Signed)
[facility] Discharge Instructions for Patients Receiving Chemotherapy  Today you received the following chemotherapy agents Herceptin.  To help prevent nausea and vomiting after your treatment, we encourage you to take your nausea medication as ordered per MD.    If you develop nausea and vomiting that is not controlled by your nausea medication, call the clinic. If it is after clinic hours your family physician or the after hours number for the clinic or go to the Emergency Department.   BELOW ARE SYMPTOMS THAT SHOULD BE REPORTED IMMEDIATELY:  *FEVER GREATER THAN 100.5 F  *CHILLS WITH OR WITHOUT FEVER  NAUSEA AND VOMITING THAT IS NOT CONTROLLED WITH YOUR NAUSEA MEDICATION  *UNUSUAL SHORTNESS OF BREATH  *UNUSUAL BRUISING OR BLEEDING  TENDERNESS IN MOUTH AND THROAT WITH OR WITHOUT PRESENCE OF ULCERS  *URINARY PROBLEMS  *BOWEL PROBLEMS  UNUSUAL RASH Items with * indicate a potential emergency and should be followed up as soon as possible.  . Please let the nurse know about any problems that you may have experienced. Feel free to call the clinic you have any questions or concerns. The clinic phone number is [phone].   I have been informed and understand all the instructions given to me. I know to contact the clinic, my physician, or go to the Emergency Department if any problems should occur. I do not have any questions at this time, but understand that I may call the clinic during office hours   should I have any questions or need assistance in obtaining follow up care.    __________________________________________  _____________  __________ Signature of Patient or Authorized Representative            Date                   Time    __________________________________________ Nurse's Signature    

## 2012-08-11 NOTE — Progress Notes (Signed)
1030-Pt states no further "passing out episodes or dizziness".  Pt saw her PCP yesterday and he d/c'd her Benicar.  Pt is keeping a record of her BP at home for her PCP.  Pt states that she has been pushing fluids at home per order of her PCP.

## 2012-08-18 ENCOUNTER — Ambulatory Visit: Payer: BC Managed Care – PPO

## 2012-08-18 ENCOUNTER — Other Ambulatory Visit: Payer: BC Managed Care – PPO | Admitting: Lab

## 2012-08-18 ENCOUNTER — Ambulatory Visit (HOSPITAL_BASED_OUTPATIENT_CLINIC_OR_DEPARTMENT_OTHER): Payer: BC Managed Care – PPO

## 2012-08-18 ENCOUNTER — Other Ambulatory Visit (HOSPITAL_BASED_OUTPATIENT_CLINIC_OR_DEPARTMENT_OTHER): Payer: BC Managed Care – PPO | Admitting: Lab

## 2012-08-18 ENCOUNTER — Encounter: Payer: Self-pay | Admitting: Adult Health

## 2012-08-18 ENCOUNTER — Ambulatory Visit (HOSPITAL_BASED_OUTPATIENT_CLINIC_OR_DEPARTMENT_OTHER): Payer: BC Managed Care – PPO | Admitting: Adult Health

## 2012-08-18 VITALS — BP 118/69 | HR 79 | Temp 97.9°F | Resp 20 | Ht 62.0 in | Wt 164.1 lb

## 2012-08-18 LAB — CBC WITH DIFFERENTIAL/PLATELET
EOS%: 0.1 % (ref 0.0–7.0)
Eosinophils Absolute: 0 10*3/uL (ref 0.0–0.5)
LYMPH%: 27.1 % (ref 14.0–49.7)
MCH: 29.8 pg (ref 25.1–34.0)
MCHC: 32.6 g/dL (ref 31.5–36.0)
MCV: 91.4 fL (ref 79.5–101.0)
MONO%: 4.7 % (ref 0.0–14.0)
Platelets: 174 10*3/uL (ref 145–400)
RBC: 3.15 10*6/uL — ABNORMAL LOW (ref 3.70–5.45)
RDW: 15.3 % — ABNORMAL HIGH (ref 11.2–14.5)
nRBC: 0 % (ref 0–0)

## 2012-08-18 LAB — COMPREHENSIVE METABOLIC PANEL (CC13)
ALT: 38 U/L (ref 0–55)
Alkaline Phosphatase: 93 U/L (ref 40–150)
CO2: 25 mEq/L (ref 22–29)
Creatinine: 0.8 mg/dL (ref 0.6–1.1)
Sodium: 141 mEq/L (ref 136–145)
Total Bilirubin: 0.36 mg/dL (ref 0.20–1.20)
Total Protein: 6.4 g/dL (ref 6.4–8.3)

## 2012-08-18 MED ORDER — SODIUM CHLORIDE 0.9 % IV SOLN
Freq: Once | INTRAVENOUS | Status: AC
  Administered 2012-08-18: 14:00:00 via INTRAVENOUS

## 2012-08-18 MED ORDER — SODIUM CHLORIDE 0.9 % IJ SOLN
10.0000 mL | INTRAMUSCULAR | Status: DC | PRN
Start: 2012-08-18 — End: 2012-08-18
  Administered 2012-08-18: 10 mL
  Filled 2012-08-18: qty 10

## 2012-08-18 MED ORDER — DIPHENHYDRAMINE HCL 25 MG PO CAPS
50.0000 mg | ORAL_CAPSULE | Freq: Once | ORAL | Status: AC
  Administered 2012-08-18: 50 mg via ORAL

## 2012-08-18 MED ORDER — ACETAMINOPHEN 325 MG PO TABS
650.0000 mg | ORAL_TABLET | Freq: Once | ORAL | Status: AC
  Administered 2012-08-18: 650 mg via ORAL

## 2012-08-18 MED ORDER — TRASTUZUMAB CHEMO INJECTION 440 MG
2.0000 mg/kg | Freq: Once | INTRAVENOUS | Status: AC
  Administered 2012-08-18: 147 mg via INTRAVENOUS
  Filled 2012-08-18: qty 7

## 2012-08-18 MED ORDER — HEPARIN SOD (PORK) LOCK FLUSH 100 UNIT/ML IV SOLN
500.0000 [IU] | Freq: Once | INTRAVENOUS | Status: AC | PRN
  Administered 2012-08-18: 500 [IU]
  Filled 2012-08-18: qty 5

## 2012-08-18 NOTE — Progress Notes (Signed)
OFFICE PROGRESS NOTE  CCGeorgann Housekeeper, MD [address] [place] [address]  DIAGNOSIS: 67 year old female with invasive ductal carcinoma that was ER +100% PR +100% HER-2/neu positive with a ratio of 2.45. Ki-67 was 55% and elevated tumor was grade 2.  She had a lumpectomy and sentinel node biopsy revealing T1CN1A, stage IIA breast cancer of the left breast.       PRIOR THERAPY: 1. She noted a left breast mass on self throat examination. She underwent a mammogram in December that showed the mass. She went on to have it biopsied on 05/14/2012. The pathology revealed invasive ductal carcinoma that was ER +100% PR +100% HER-2/neu positive with a ratio of 2.45. Ki-67 was 55% and elevated tumor was grade 2. She had an MRI performed that showed the area to be measuring out at 1.6 cm  2. She underwent a lumpectomy with sentinel node biopsy on 06/10/12.  revealing T1CN1A, stage IIA breast cancer of the left breast.  3. She started chemotherapy consisting of TCH on 07/14/12 with 6 cycles planned.    CURRENT THERAPY: TCH C 2 day 15 with weekly herceptin  INTERVAL HISTORY: [name] 67 y.o. female returns for follow up today.  She is feeling well today.  She's doing well. 5 days after receiving the [facility] she became dizzy, and nearly fainted.  She went to the ER, and had a syncopal workup which was negative.  She was dehydrated.  She has been working on increasing her fluid intake and has had no episodes similar to that since.  Otherwise she denies fevers, chills, nausea, vomiting, numbness, or any other concerns.   MEDICAL HISTORY: Past Medical History  Diagnosis Date  . Hyperlipidemia   . Invasive ductal carcinoma of breast 2013    Left  . Arthritis   . Depression   . Heart murmur     heard one when she was pregnamt-maybe had echo-cannot remember  . Hypertension   . GERD (gastroesophageal reflux disease)     ALLERGIES:  has No Known Allergies.  MEDICATIONS:   Current Outpatient Prescriptions  Medication Sig Dispense Refill  . Ascorbic Acid (VITAMIN C) 1000 MG tablet Take 1,000 mg by mouth daily.        Marland Kitchen aspirin 325 MG tablet Take 325 mg by mouth daily.        Marland Kitchen atenolol (TENORMIN) 50 MG tablet Take 50 mg by mouth daily.      . Cholecalciferol (VITAMIN D) 2000 UNITS tablet Take 2,000 Units by mouth daily.      . citalopram (CELEXA) 10 MG tablet Take 10 mg by mouth daily.       Marland Kitchen dexamethasone (DECADRON) 4 MG tablet Take 2 tablets (8 mg total) by mouth 2 (two) times daily with a meal. Take two times a day the day before Taxotere. Then take two times a day starting the day after chemo for 3 days.  30 tablet  1  . lidocaine-prilocaine (EMLA) cream Apply 1 application topically as needed (used for chemo).      . LORazepam (ATIVAN) 0.5 MG tablet Take 1 tablet (0.5 mg total) by mouth every 6 (six) hours as needed (Nausea or vomiting).  30 tablet  0  . omeprazole (PRILOSEC) 40 MG capsule Take 40 mg by mouth daily.      . ondansetron (ZOFRAN) 8 MG tablet Take 1 tablet (8 mg total) by mouth 2 (two) times daily. Take two times a day starting the day  after chemo for 3 days. Then take two times a day as needed for nausea or vomiting.  30 tablet  1  . prochlorperazine (COMPAZINE) 10 MG tablet Take 1 tablet (10 mg total) by mouth every 6 (six) hours as needed (Nausea or vomiting).  30 tablet  1  . prochlorperazine (COMPAZINE) 25 MG suppository Place 1 suppository (25 mg total) rectally every 12 (twelve) hours as needed for nausea.  12 suppository  3  . simvastatin (ZOCOR) 80 MG tablet Take 40 mg by mouth at bedtime.       . Trospium Chloride 60 MG CP24 Take 60 mg by mouth daily.      . vitamin E 400 UNIT capsule Take 400 Units by mouth daily.        Marland Kitchen olmesartan-hydrochlorothiazide (BENICAR HCT) 40-12.5 MG per tablet Take 1 tablet by mouth daily.      Marland Kitchen oxyCODONE-acetaminophen (PERCOCET/ROXICET) 5-325 MG per tablet Take 1-2 tablets by mouth every 6 (six) hours as  needed for pain.       No current facility-administered medications for this visit.    SURGICAL HISTORY:  Past Surgical History  Procedure Laterality Date  . Tubal ligation  1988  . Bladder suspension  10/2010  . Tonsillectomy and adenoidectomy      age 38  . Abdominal adhesion surgery  1980  . Benign breast biopsy  1994    Left  . Breast lumpectomy with needle localization and axillary sentinel lymph node bx  06/10/2012    Procedure: BREAST LUMPECTOMY WITH NEEDLE LOCALIZATION AND AXILLARY SENTINEL LYMPH NODE BX;  Surgeon: Emelia Loron, MD;  Location: [facility];  Service: General;  Laterality: Left;  . Appendectomy    . Cholecystectomy    . Portacath placement  07/07/2012    Procedure: INSERTION PORT-A-CATH;  Surgeon: Emelia Loron, MD;  Location: [facility];  Service: General;  Laterality: Left;  Port a cath insertion    REVIEW OF SYSTEMS:    General: fatigue (-), night sweats (-), fever (-), pain (-) Lymph: palpable nodes (-) HEENT: vision changes (-), mucositis (-), gum bleeding (-), epistaxis (-) Cardiovascular: chest pain (-), palpitations (-) Pulmonary: shortness of breath (-), dyspnea on exertion (-), cough (-), hemoptysis (-) GI:  Early satiety (-), melena (-), dysphagia (-), nausea/vomiting (-), diarrhea (-) GU: dysuria (-), hematuria (-), incontinence (-) Musculoskeletal: joint swelling (-), joint pain (-), back pain (-) Neuro: weakness (-), numbness (-), headache (-), confusion (-) Skin: Rash (-), lesions (-), dryness (-) Psych: depression (-), suicidal/homicidal ideation (-), feeling of hopelessness (-)   PHYSICAL EXAMINATION: Blood pressure 118/69, pulse 79, temperature 97.9 F (36.6 C), temperature source Oral, resp. rate 20, height 5\' 2"  (1.575 m), weight 164 lb 1.6 oz (74.435 kg). Body mass index is 30.01 kg/(m^2). General: Patient is a well appearing female in no acute distress HEENT: PERRLA, sclerae anicteric no  conjunctival pallor, MMM Neck: supple, no palpable adenopathy Lungs: clear to auscultation bilaterally, no wheezes, rhonchi, or rales Cardiovascular: regular rate rhythm, S1, S2,  Systolic murmur, no rubs or gallops Abdomen: Soft, non-tender, non-distended, normoactive bowel sounds, no HSM Extremities: warm and well perfused, no clubbing, cyanosis, or edema Skin: No rashes or lesions Neuro: Non-focal Breasts:  Left breast incision site well healed, no nodularity, right breast no masses or nodules.  ECOG PERFORMANCE STATUS: 1 - Symptomatic but completely ambulatory      LABORATORY DATA: Lab Results  Component Value Date   WBC 8.5 08/18/2012  HGB 9.4* 08/18/2012   HCT 28.8* 08/18/2012   MCV 91.4 08/18/2012   PLT 174 08/18/2012      Chemistry      Component Value Date/Time   NA 138 08/11/2012 0907   NA 137 08/09/2012 1935   K 4.3 08/11/2012 0907   K 4.2 08/09/2012 1935   CL 107 08/11/2012 0907   CL 103 08/09/2012 1935   CO2 24 08/11/2012 0907   CO2 24 08/09/2012 1935   BUN 23.6 08/11/2012 0907   BUN 48* 08/09/2012 1935   CREATININE 0.9 08/11/2012 0907   CREATININE 1.13* 08/09/2012 1935      Component Value Date/Time   CALCIUM 9.0 08/11/2012 0907   CALCIUM 9.3 08/09/2012 1935   ALKPHOS 87 08/11/2012 0907   ALKPHOS 65 06/08/2012 0924   AST 16 08/11/2012 0907   AST 22 06/08/2012 0924   ALT 24 08/11/2012 0907   ALT 21 06/08/2012 0924   BILITOT 0.52 08/11/2012 0907   BILITOT 0.5 06/08/2012 0924     FINAL DIAGNOSIS Diagnosis 1. Breast, lumpectomy, Left - INVASIVE DUCTAL CARCINOMA (1.2CM) SEE COMMENT - LYMPHOVASCULAR INVASION IDENTIFIED. - INVASIVE TUMOR IS LESS THAN 0.1 MM FROM NEAREST MARGIN (ANTERIOR). - LYMPHOVASCULAR INVASION IDENTIFIED. - DUCTAL CARCINOMA IN SITU WITH CALCIFICATIONS AND COMEDO NECROSIS. - IN SITU CARCINOMA IS PRESENT AT ANTERIOR MARGIN. - SEE TUMOR SYNOPTIC TEMPLATE BELOW. 2. Lymph node, sentinel, biopsy, Left - ONE LYMPH NODE, POSITIVE FOR METASTATIC MAMMARY CARCINOMA  (1/1) - TUMOR DEPOSIT IS 1.1CM - NO EXTRACAPSULAR TUMOR EXTENSION PRESENT. Microscopic Comment 1. BREAST, INVASIVE TUMOR, WITH LYMPH NODE SAMPLING Specimen, including laterality: Left breast. Procedure: Lumpectomy. Grade: II of III Tubule formation: 3 Nuclear pleomorphism: 2 Mitotic:1 Tumor size (gross measurement): 1.2 cm Margins: Invasive, distance to closest margin: Less than 0.1 mm, see comment. In-situ, distance to closest margin: Present at anterior margin. If margin positive, focally or broadly: Focally Lymphovascular invasion: Present. Ductal carcinoma in situ: Present Grade: III of III Extensive intraductal component: Absent. 1 of 3 FINAL for [name], [name] ([id number]) Microscopic Comment(continued) Lobular neoplasia: Absent. Tumor focality: Unifocal. Treatment effect: None. If present, treatment effect in breast tissue, lymph nodes or both: N/A Extent of tumor: Skin: Negative for tumor. Nipple: N/A Skeletal muscle: N/A Lymph nodes: # examined: 1 Lymph nodes with metastasis: 1 Macrometastasis: (> 2.0 mm): 1.1 cm Extracapsular extension: Absent. Breast prognostic profile: Estrogen receptor: Not repeated, previous study demonstrated 100% positivity ([id number]) Progesterone receptor: Not repeated, previous study demonstrated 100% positivity ([id number]) Her 2 neu: Repeated, previous study demonstrated amplification at (2.45) ([id number]). Ki-67: Not repeated, previous study demonstrated 55% proliferation rate. Non-neoplastic breast: Previous biopsy site, vascular calcifications, benign fibrocytic change, and microcalcifications in benign ducts and lobules. TNM: pT1c, pN1a, pMX Comments: Although invasive tumor extends into cauterized tissue, there is no invasive tumor definitively identified at the cauterized edge (slides 1A-1C). (BJS:gt, 06/14/12) [place] RUND DO Pathologist, Electronic Signature (Case signed 06/14/2012) Specimen Gross and Clinical  Information Specimen(s) Obtained: 1. Breast, lumpectomy, Left 2. Lymph node, sentinel, biopsy, Left Specimen Clinical Information  RADIOGRAPHIC STUDIES:   ASSESSMENT:  67 year old female with  #1 new diagnosis of 1.6 cm (by MRI) invasive ductal carcinoma grade 2 ER +100% PR +100% HER-2/neu amplified with a ratio of 2.45 with an elevated Ki-67 of 55%. Patient is status post biopsy on 05/14/2012. Patient found a mass on self breast examination. Patient was seen by Dr. Harden Mo and he feels that the patient is a breast conservation candidate with lumpectomy and sentinel lymph  node biopsy. She is now being seen for discussion of adjuvant treatment post lumpectomy.   #2 patient and I discussed her pathology in detail. We discussed the significance of grade as well as elevated Ki-67 and HER-2/neu positivity. We discussed use of HER-2 based treatment such as with Herceptin and chemotherapy. We discussed combination Herceptin and chemotherapy consisting of Taxotere carboplatinum and Herceptin. The Taxotere and carboplatinum would be given every 3 weeks for a total of 6 cycles. Herceptin would be given initially weekly with her chemotherapy and then changed to every 3 weeks to finish out one year of HER-2 therapy.   #3 patient certainly will need radiation therapy and she will be seen by Dr. Doristine Devoid.   #4 because patient's tumor is estrogen receptor positive she would also received adjuvant antiestrogen therapy in the form of an aromatase inhibitors and she is postmenopausal. We discussed the different aromatase inhibitors.    PLAN:   1. Doing well. Patient will proceed with Herceptin today.  Her labs are stable  2. In order to prevent dehydration again, we will give her IV fluids the day of and after chemotherapy in addition to the following week.   3. I will see her back next week for [facility].     All questions were answered. The patient knows to call the clinic with any problems,  questions or concerns. We can certainly see the patient much sooner if necessary.  I spent 25 minutes counseling the patient face to face. The total time spent in the appointment was 30 minutes.   Cherie Ouch Lyn Hollingshead, NP Medical Oncology [facility] Phone: [phone] 08/18/2012, 1:14 PM

## 2012-08-18 NOTE — Patient Instructions (Addendum)
[facility] Discharge Instructions for Patients Receiving Chemotherapy  Today you received the following chemotherapy agents :  Herceptin.  To help prevent nausea and vomiting after your treatment, we encourage you to take your nausea medication as instructed by your physician.    If you develop nausea and vomiting that is not controlled by your nausea medication, call the clinic. If it is after clinic hours your family physician or the after hours number for the clinic or go to the Emergency Department.   BELOW ARE SYMPTOMS THAT SHOULD BE REPORTED IMMEDIATELY:  *FEVER GREATER THAN 100.5 F  *CHILLS WITH OR WITHOUT FEVER  NAUSEA AND VOMITING THAT IS NOT CONTROLLED WITH YOUR NAUSEA MEDICATION  *UNUSUAL SHORTNESS OF BREATH  *UNUSUAL BRUISING OR BLEEDING  TENDERNESS IN MOUTH AND THROAT WITH OR WITHOUT PRESENCE OF ULCERS  *URINARY PROBLEMS  *BOWEL PROBLEMS  UNUSUAL RASH Items with * indicate a potential emergency and should be followed up as soon as possible.  One of the nurses will contact you 24 hours after your treatment. Please let the nurse know about any problems that you may have experienced. Feel free to call the clinic you have any questions or concerns. The clinic phone number is [phone].   I have been informed and understand all the instructions given to me. I know to contact the clinic, my physician, or go to the Emergency Department if any problems should occur. I do not have any questions at this time, but understand that I may call the clinic during office hours   should I have any questions or need assistance in obtaining follow up care.    __________________________________________  _____________  __________ Signature of Patient or Authorized Representative            Date                   Time    __________________________________________ Nurse's Signature    

## 2012-08-18 NOTE — Patient Instructions (Signed)
Doing well.  Proceed with Herceptin.  Please call us if you have any questions or concerns.    

## 2012-08-25 ENCOUNTER — Encounter: Payer: Self-pay | Admitting: Adult Health

## 2012-08-25 ENCOUNTER — Ambulatory Visit (HOSPITAL_BASED_OUTPATIENT_CLINIC_OR_DEPARTMENT_OTHER): Payer: BC Managed Care – PPO

## 2012-08-25 ENCOUNTER — Other Ambulatory Visit (HOSPITAL_BASED_OUTPATIENT_CLINIC_OR_DEPARTMENT_OTHER): Payer: BC Managed Care – PPO | Admitting: Lab

## 2012-08-25 ENCOUNTER — Encounter: Payer: Self-pay | Admitting: Oncology

## 2012-08-25 ENCOUNTER — Ambulatory Visit (HOSPITAL_BASED_OUTPATIENT_CLINIC_OR_DEPARTMENT_OTHER): Payer: BC Managed Care – PPO | Admitting: Adult Health

## 2012-08-25 VITALS — BP 148/80 | HR 65 | Temp 98.3°F | Resp 20 | Ht 62.0 in | Wt 163.8 lb

## 2012-08-25 DIAGNOSIS — C50412 Malignant neoplasm of upper-outer quadrant of left female breast: Secondary | ICD-10-CM

## 2012-08-25 DIAGNOSIS — C50419 Malignant neoplasm of upper-outer quadrant of unspecified female breast: Secondary | ICD-10-CM

## 2012-08-25 DIAGNOSIS — Z5111 Encounter for antineoplastic chemotherapy: Secondary | ICD-10-CM

## 2012-08-25 DIAGNOSIS — C50219 Malignant neoplasm of upper-inner quadrant of unspecified female breast: Secondary | ICD-10-CM

## 2012-08-25 DIAGNOSIS — E860 Dehydration: Secondary | ICD-10-CM

## 2012-08-25 DIAGNOSIS — Z170 Estrogen receptor positive status [ER+]: Secondary | ICD-10-CM

## 2012-08-25 DIAGNOSIS — Z5112 Encounter for antineoplastic immunotherapy: Secondary | ICD-10-CM

## 2012-08-25 LAB — CBC WITH DIFFERENTIAL/PLATELET
Eosinophils Absolute: 0 10*3/uL (ref 0.0–0.5)
HCT: 31.4 % — ABNORMAL LOW (ref 34.8–46.6)
LYMPH%: 12.8 % — ABNORMAL LOW (ref 14.0–49.7)
MCHC: 32.8 g/dL (ref 31.5–36.0)
MCV: 91.8 fL (ref 79.5–101.0)
MONO#: 0.5 10*3/uL (ref 0.1–0.9)
MONO%: 4.8 % (ref 0.0–14.0)
NEUT#: 9 10*3/uL — ABNORMAL HIGH (ref 1.5–6.5)
NEUT%: 82.3 % — ABNORMAL HIGH (ref 38.4–76.8)
Platelets: 354 10*3/uL (ref 145–400)
WBC: 10.9 10*3/uL — ABNORMAL HIGH (ref 3.9–10.3)

## 2012-08-25 LAB — COMPREHENSIVE METABOLIC PANEL (CC13)
BUN: 26.5 mg/dL — ABNORMAL HIGH (ref 7.0–26.0)
CO2: 23 mEq/L (ref 22–29)
Calcium: 9.9 mg/dL (ref 8.4–10.4)
Chloride: 108 mEq/L — ABNORMAL HIGH (ref 98–107)
Creatinine: 1 mg/dL (ref 0.6–1.1)
Total Bilirubin: 0.44 mg/dL (ref 0.20–1.20)

## 2012-08-25 MED ORDER — ACETAMINOPHEN 325 MG PO TABS
650.0000 mg | ORAL_TABLET | Freq: Once | ORAL | Status: AC
  Administered 2012-08-25: 650 mg via ORAL

## 2012-08-25 MED ORDER — SODIUM CHLORIDE 0.9 % IJ SOLN
10.0000 mL | INTRAMUSCULAR | Status: DC | PRN
  Administered 2012-08-25: 10 mL
  Filled 2012-08-25: qty 10

## 2012-08-25 MED ORDER — SODIUM CHLORIDE 0.9 % IV SOLN
Freq: Once | INTRAVENOUS | Status: AC
  Administered 2012-08-25: 13:00:00 via INTRAVENOUS

## 2012-08-25 MED ORDER — SODIUM CHLORIDE 0.9 % IV SOLN
130.0000 mg | Freq: Once | INTRAVENOUS | Status: AC
  Administered 2012-08-25: 130 mg via INTRAVENOUS
  Filled 2012-08-25: qty 13

## 2012-08-25 MED ORDER — DEXAMETHASONE SODIUM PHOSPHATE 4 MG/ML IJ SOLN
20.0000 mg | Freq: Once | INTRAMUSCULAR | Status: AC
  Administered 2012-08-25: 20 mg via INTRAVENOUS

## 2012-08-25 MED ORDER — DOCETAXEL CHEMO INJECTION 160 MG/16ML: 75.0000 mg/m2 | Freq: Once | INTRAVENOUS | Status: DC

## 2012-08-25 MED ORDER — SODIUM CHLORIDE 0.9 % IV SOLN
620.0000 mg | Freq: Once | INTRAVENOUS | Status: AC
  Administered 2012-08-25: 620 mg via INTRAVENOUS
  Filled 2012-08-25: qty 62

## 2012-08-25 MED ORDER — TRASTUZUMAB CHEMO INJECTION 440 MG
2.0000 mg/kg | Freq: Once | INTRAVENOUS | Status: AC
  Administered 2012-08-25: 147 mg via INTRAVENOUS
  Filled 2012-08-25: qty 7

## 2012-08-25 MED ORDER — DIPHENHYDRAMINE HCL 25 MG PO CAPS
50.0000 mg | ORAL_CAPSULE | Freq: Once | ORAL | Status: AC
  Administered 2012-08-25: 50 mg via ORAL

## 2012-08-25 MED ORDER — HEPARIN SOD (PORK) LOCK FLUSH 100 UNIT/ML IV SOLN
500.0000 [IU] | Freq: Once | INTRAVENOUS | Status: AC | PRN
  Administered 2012-08-25: 500 [IU]
  Filled 2012-08-25: qty 5

## 2012-08-25 MED ORDER — ONDANSETRON 16 MG/50ML IVPB (CHCC)
16.0000 mg | Freq: Once | INTRAVENOUS | Status: AC
  Administered 2012-08-25: 16 mg via INTRAVENOUS

## 2012-08-25 NOTE — Progress Notes (Signed)
OFFICE PROGRESS NOTE  CCGeorgann Housekeeper, MD [address] [place] [address]  DIAGNOSIS: 67 year old female with invasive ductal carcinoma that was ER +100% PR +100% HER-2/neu positive with a ratio of 2.45. Ki-67 was 55% and elevated tumor was grade 2.  She had a lumpectomy and sentinel node biopsy revealing T1CN1A, stage IIA breast cancer of the left breast.       PRIOR THERAPY: 1. She noted a left breast mass on self throat examination. She underwent a mammogram in December that showed the mass. She went on to have it biopsied on 05/14/2012. The pathology revealed invasive ductal carcinoma that was ER +100% PR +100% HER-2/neu positive with a ratio of 2.45. Ki-67 was 55% and elevated tumor was grade 2. She had an MRI performed that showed the area to be measuring out at 1.6 cm  2. She underwent a lumpectomy with sentinel node biopsy on 06/10/12.  revealing T1CN1A, stage IIA breast cancer of the left breast.  3. She started chemotherapy consisting of TCH on 07/14/12 with 6 cycles planned.    CURRENT THERAPY: TCH C 3 day 1 with weekly herceptin  INTERVAL HISTORY: [name] 67 y.o. female returns for follow up today.  She is feeling well today.  She's ready to proceed with chemotherapy.  She denies fevers, chills, nausea, vomiting, diarrhea, constipation, numbness, or any other concerns.    MEDICAL HISTORY: Past Medical History  Diagnosis Date  . Hyperlipidemia   . Invasive ductal carcinoma of breast 2013    Left  . Arthritis   . Depression   . Heart murmur     heard one when she was pregnamt-maybe had echo-cannot remember  . Hypertension   . GERD (gastroesophageal reflux disease)     ALLERGIES:  has No Known Allergies.  MEDICATIONS:  Current Outpatient Prescriptions  Medication Sig Dispense Refill  . Ascorbic Acid (VITAMIN C) 1000 MG tablet Take 1,000 mg by mouth daily.        Marland Kitchen aspirin 325 MG tablet Take 325 mg by mouth daily.        Marland Kitchen  atenolol (TENORMIN) 50 MG tablet Take 50 mg by mouth daily.      . Cholecalciferol (VITAMIN D) 2000 UNITS tablet Take 2,000 Units by mouth daily.      . citalopram (CELEXA) 10 MG tablet Take 10 mg by mouth daily.       Marland Kitchen dexamethasone (DECADRON) 4 MG tablet Take 2 tablets (8 mg total) by mouth 2 (two) times daily with a meal. Take two times a day the day before Taxotere. Then take two times a day starting the day after chemo for 3 days.  30 tablet  1  . lidocaine-prilocaine (EMLA) cream Apply 1 application topically as needed (used for chemo).      . LORazepam (ATIVAN) 0.5 MG tablet Take 1 tablet (0.5 mg total) by mouth every 6 (six) hours as needed (Nausea or vomiting).  30 tablet  0  . olmesartan-hydrochlorothiazide (BENICAR HCT) 40-12.5 MG per tablet Take 1 tablet by mouth daily.      Marland Kitchen omeprazole (PRILOSEC) 40 MG capsule Take 40 mg by mouth daily.      . ondansetron (ZOFRAN) 8 MG tablet Take 1 tablet (8 mg total) by mouth 2 (two) times daily. Take two times a day starting the day after chemo for 3 days. Then take two times a day as needed for nausea or vomiting.  30 tablet  1  . prochlorperazine (  COMPAZINE) 10 MG tablet Take 1 tablet (10 mg total) by mouth every 6 (six) hours as needed (Nausea or vomiting).  30 tablet  1  . prochlorperazine (COMPAZINE) 25 MG suppository Place 1 suppository (25 mg total) rectally every 12 (twelve) hours as needed for nausea.  12 suppository  3  . simvastatin (ZOCOR) 80 MG tablet Take 40 mg by mouth at bedtime.       . Trospium Chloride 60 MG CP24 Take 60 mg by mouth daily.      . vitamin E 400 UNIT capsule Take 400 Units by mouth daily.         No current facility-administered medications for this visit.   Facility-Administered Medications Ordered in Other Visits  Medication Dose Route Frequency Provider Last Rate Last Dose  . sodium chloride 0.9 % injection 10 mL  10 mL Intracatheter PRN Victorino December, MD   10 mL at 08/25/12 1604    SURGICAL HISTORY:   Past Surgical History  Procedure Laterality Date  . Tubal ligation  1988  . Bladder suspension  10/2010  . Tonsillectomy and adenoidectomy      age 71  . Abdominal adhesion surgery  1980  . Benign breast biopsy  1994    Left  . Breast lumpectomy with needle localization and axillary sentinel lymph node bx  06/10/2012    Procedure: BREAST LUMPECTOMY WITH NEEDLE LOCALIZATION AND AXILLARY SENTINEL LYMPH NODE BX;  Surgeon: Emelia Loron, MD;  Location: [facility];  Service: General;  Laterality: Left;  . Appendectomy    . Cholecystectomy    . Portacath placement  07/07/2012    Procedure: INSERTION PORT-A-CATH;  Surgeon: Emelia Loron, MD;  Location: [facility];  Service: General;  Laterality: Left;  Port a cath insertion    REVIEW OF SYSTEMS:    General: fatigue (-), night sweats (-), fever (-), pain (-) Lymph: palpable nodes (-) HEENT: vision changes (-), mucositis (-), gum bleeding (-), epistaxis (-) Cardiovascular: chest pain (-), palpitations (-) Pulmonary: shortness of breath (-), dyspnea on exertion (-), cough (-), hemoptysis (-) GI:  Early satiety (-), melena (-), dysphagia (-), nausea/vomiting (-), diarrhea (-) GU: dysuria (-), hematuria (-), incontinence (-) Musculoskeletal: joint swelling (-), joint pain (-), back pain (-) Neuro: weakness (-), numbness (-), headache (-), confusion (-) Skin: Rash (-), lesions (-), dryness (-) Psych: depression (-), suicidal/homicidal ideation (-), feeling of hopelessness (-)   PHYSICAL EXAMINATION: Blood pressure 148/80, pulse 65, temperature 98.3 F (36.8 C), temperature source Oral, resp. rate 20, height 5\' 2"  (1.575 m), weight 163 lb 12.8 oz (74.299 kg). Body mass index is 29.95 kg/(m^2). General: Patient is a well appearing female in no acute distress HEENT: PERRLA, sclerae anicteric no conjunctival pallor, MMM Neck: supple, no palpable adenopathy Lungs: clear to auscultation bilaterally, no wheezes,  rhonchi, or rales Cardiovascular: regular rate rhythm, S1, S2,  Systolic murmur, no rubs or gallops Abdomen: Soft, non-tender, non-distended, normoactive bowel sounds, no HSM Extremities: warm and well perfused, no clubbing, cyanosis, or edema Skin: No rashes or lesions Neuro: Non-focal Breasts:  Left breast incision site well healed, no nodularity, right breast no masses or nodules.  ECOG PERFORMANCE STATUS: 1 - Symptomatic but completely ambulatory      LABORATORY DATA: Lab Results  Component Value Date   WBC 10.9* 08/25/2012   HGB 10.3* 08/25/2012   HCT 31.4* 08/25/2012   MCV 91.8 08/25/2012   PLT 354 08/25/2012      Chemistry  Component Value Date/Time   NA 140 08/25/2012 1102   NA 137 08/09/2012 1935   K 4.1 08/25/2012 1102   K 4.2 08/09/2012 1935   CL 108* 08/25/2012 1102   CL 103 08/09/2012 1935   CO2 23 08/25/2012 1102   CO2 24 08/09/2012 1935   BUN 26.5* 08/25/2012 1102   BUN 48* 08/09/2012 1935   CREATININE 1.0 08/25/2012 1102   CREATININE 1.13* 08/09/2012 1935      Component Value Date/Time   CALCIUM 9.9 08/25/2012 1102   CALCIUM 9.3 08/09/2012 1935   ALKPHOS 90 08/25/2012 1102   ALKPHOS 65 06/08/2012 0924   AST 20 08/25/2012 1102   AST 22 06/08/2012 0924   ALT 32 08/25/2012 1102   ALT 21 06/08/2012 0924   BILITOT 0.44 08/25/2012 1102   BILITOT 0.5 06/08/2012 0924     FINAL DIAGNOSIS Diagnosis 1. Breast, lumpectomy, Left - INVASIVE DUCTAL CARCINOMA (1.2CM) SEE COMMENT - LYMPHOVASCULAR INVASION IDENTIFIED. - INVASIVE TUMOR IS LESS THAN 0.1 MM FROM NEAREST MARGIN (ANTERIOR). - LYMPHOVASCULAR INVASION IDENTIFIED. - DUCTAL CARCINOMA IN SITU WITH CALCIFICATIONS AND COMEDO NECROSIS. - IN SITU CARCINOMA IS PRESENT AT ANTERIOR MARGIN. - SEE TUMOR SYNOPTIC TEMPLATE BELOW. 2. Lymph node, sentinel, biopsy, Left - ONE LYMPH NODE, POSITIVE FOR METASTATIC MAMMARY CARCINOMA (1/1) - TUMOR DEPOSIT IS 1.1CM - NO EXTRACAPSULAR TUMOR EXTENSION PRESENT. Microscopic Comment 1. BREAST,  INVASIVE TUMOR, WITH LYMPH NODE SAMPLING Specimen, including laterality: Left breast. Procedure: Lumpectomy. Grade: II of III Tubule formation: 3 Nuclear pleomorphism: 2 Mitotic:1 Tumor size (gross measurement): 1.2 cm Margins: Invasive, distance to closest margin: Less than 0.1 mm, see comment. In-situ, distance to closest margin: Present at anterior margin. If margin positive, focally or broadly: Focally Lymphovascular invasion: Present. Ductal carcinoma in situ: Present Grade: III of III Extensive intraductal component: Absent. 1 of 3 FINAL for [name], [name] ([id number]) Microscopic Comment(continued) Lobular neoplasia: Absent. Tumor focality: Unifocal. Treatment effect: None. If present, treatment effect in breast tissue, lymph nodes or both: N/A Extent of tumor: Skin: Negative for tumor. Nipple: N/A Skeletal muscle: N/A Lymph nodes: # examined: 1 Lymph nodes with metastasis: 1 Macrometastasis: (> 2.0 mm): 1.1 cm Extracapsular extension: Absent. Breast prognostic profile: Estrogen receptor: Not repeated, previous study demonstrated 100% positivity ([id number]) Progesterone receptor: Not repeated, previous study demonstrated 100% positivity ([id number]) Her 2 neu: Repeated, previous study demonstrated amplification at (2.45) ([id number]). Ki-67: Not repeated, previous study demonstrated 55% proliferation rate. Non-neoplastic breast: Previous biopsy site, vascular calcifications, benign fibrocytic change, and microcalcifications in benign ducts and lobules. TNM: pT1c, pN1a, pMX Comments: Although invasive tumor extends into cauterized tissue, there is no invasive tumor definitively identified at the cauterized edge (slides 1A-1C). (BJS:gt, 06/14/12) [place] RUND DO Pathologist, Electronic Signature (Case signed 06/14/2012) Specimen Gross and Clinical Information Specimen(s) Obtained: 1. Breast, lumpectomy, Left 2. Lymph node, sentinel, biopsy, Left Specimen  Clinical Information  RADIOGRAPHIC STUDIES:   ASSESSMENT:  67 year old female with  #1 new diagnosis of 1.6 cm (by MRI) invasive ductal carcinoma grade 2 ER +100% PR +100% HER-2/neu amplified with a ratio of 2.45 with an elevated Ki-67 of 55%. Patient is status post biopsy on 05/14/2012. Patient found a mass on self breast examination. Patient was seen by Dr. Harden Mo and he feels that the patient is a breast conservation candidate with lumpectomy and sentinel lymph node biopsy. She is now being seen for discussion of adjuvant treatment post lumpectomy.   #2 patient and I discussed her pathology in detail. We discussed the significance  of grade as well as elevated Ki-67 and HER-2/neu positivity. We discussed use of HER-2 based treatment such as with Herceptin and chemotherapy. We discussed combination Herceptin and chemotherapy consisting of Taxotere carboplatinum and Herceptin. The Taxotere and carboplatinum would be given every 3 weeks for a total of 6 cycles. Herceptin would be given initially weekly with her chemotherapy and then changed to every 3 weeks to finish out one year of HER-2 therapy.   #3 patient certainly will need radiation therapy and she will be seen by Dr. Doristine Devoid.   #4 because patient's tumor is estrogen receptor positive she would also received adjuvant antiestrogen therapy in the form of an aromatase inhibitors and she is postmenopausal. We discussed the different aromatase inhibitors.    PLAN:   1. Doing well. Labs are stable, will proceed with chemotherapy.    2. I will see her back next week for weekly Herceptin.     All questions were answered. The patient knows to call the clinic with any problems, questions or concerns. We can certainly see the patient much sooner if necessary.  I spent 15 minutes counseling the patient face to face. The total time spent in the appointment was 30 minutes.   Cherie Ouch Lyn Hollingshead, NP Medical Oncology [facility] Phone: [phone] 08/25/2012, 4:10 PM

## 2012-08-25 NOTE — Patient Instructions (Addendum)
Doing well.  Proceed with chemotherapy.  Please call us if you have any questions or concerns.    

## 2012-08-25 NOTE — Progress Notes (Signed)
No herceptin orders, dr Rosine Beat nurse notified @1235 .  dmr

## 2012-08-25 NOTE — Patient Instructions (Addendum)
[facility] Discharge Instructions for Patients Receiving Chemotherapy  Today you received the following chemotherapy agents taxotere/ carboplatin/ herceptin  To help prevent nausea and vomiting after your treatment, we encourage you to take your nausea medication and take it as often as prescribed   If you develop nausea and vomiting that is not controlled by your nausea medication, call the clinic. If it is after clinic hours your family physician or the after hours number for the clinic or go to the Emergency Department.   BELOW ARE SYMPTOMS THAT SHOULD BE REPORTED IMMEDIATELY:  *FEVER GREATER THAN 100.5 F  *CHILLS WITH OR WITHOUT FEVER  NAUSEA AND VOMITING THAT IS NOT CONTROLLED WITH YOUR NAUSEA MEDICATION  *UNUSUAL SHORTNESS OF BREATH  *UNUSUAL BRUISING OR BLEEDING  TENDERNESS IN MOUTH AND THROAT WITH OR WITHOUT PRESENCE OF ULCERS  *URINARY PROBLEMS  *BOWEL PROBLEMS  UNUSUAL RASH Items with * indicate a potential emergency and should be followed up as soon as possible.  One of the nurses will contact you 24 hours after your treatment. Please let the nurse know about any problems that you may have experienced. Feel free to call the clinic you have any questions or concerns. The clinic phone number is [phone].   I have been informed and understand all the instructions given to me. I know to contact the clinic, my physician, or go to the Emergency Department if any problems should occur. I do not have any questions at this time, but understand that I may call the clinic during office hours   should I have any questions or need assistance in obtaining follow up care.    __________________________________________  _____________  __________ Signature of Patient or Authorized Representative            Date                   Time    __________________________________________ Nurse's Signature

## 2012-08-25 NOTE — Progress Notes (Deleted)
herceptin orders not in computer, dr's rn notified.  Other meds started.  dmr

## 2012-08-26 ENCOUNTER — Ambulatory Visit: Payer: BC Managed Care – PPO

## 2012-08-26 ENCOUNTER — Ambulatory Visit (HOSPITAL_BASED_OUTPATIENT_CLINIC_OR_DEPARTMENT_OTHER): Payer: BC Managed Care – PPO

## 2012-08-26 VITALS — BP 155/74 | HR 66 | Temp 97.6°F | Resp 20

## 2012-08-26 DIAGNOSIS — C50412 Malignant neoplasm of upper-outer quadrant of left female breast: Secondary | ICD-10-CM

## 2012-08-26 DIAGNOSIS — C50419 Malignant neoplasm of upper-outer quadrant of unspecified female breast: Secondary | ICD-10-CM

## 2012-08-26 DIAGNOSIS — E860 Dehydration: Secondary | ICD-10-CM

## 2012-08-26 DIAGNOSIS — Z5189 Encounter for other specified aftercare: Secondary | ICD-10-CM

## 2012-08-26 MED ORDER — SODIUM CHLORIDE 0.9 % IV SOLN
Freq: Once | INTRAVENOUS | Status: AC
  Administered 2012-08-26: 10:00:00 via INTRAVENOUS

## 2012-08-26 MED ORDER — PEGFILGRASTIM INJECTION 6 MG/0.6ML
6.0000 mg | Freq: Once | SUBCUTANEOUS | Status: AC
  Administered 2012-08-26: 6 mg via SUBCUTANEOUS
  Filled 2012-08-26: qty 0.6

## 2012-08-26 NOTE — Patient Instructions (Addendum)
Dehydration, Adult Dehydration is when you lose more fluids from the body than you take in. Vital organs like the kidneys, brain, and heart cannot function without a proper amount of fluids and salt. Any loss of fluids from the body can cause dehydration.  CAUSES   Vomiting.  Diarrhea.  Excessive sweating.  Excessive urine output.  Fever. SYMPTOMS  Mild dehydration  Thirst.  Dry lips.  Slightly dry mouth. Moderate dehydration  Very dry mouth.  Sunken eyes.  Skin does not bounce back quickly when lightly pinched and released.  Dark urine and decreased urine production.  Decreased tear production.  Headache. Severe dehydration  Very dry mouth.  Extreme thirst.  Rapid, weak pulse (more than 100 beats per minute at rest).  Cold hands and feet.  Not able to sweat in spite of heat and temperature.  Rapid breathing.  Blue lips.  Confusion and lethargy.  Difficulty being awakened.  Minimal urine production.  No tears. DIAGNOSIS  Your caregiver will diagnose dehydration based on your symptoms and your exam. Blood and urine tests will help confirm the diagnosis. The diagnostic evaluation should also identify the cause of dehydration. TREATMENT  Treatment of mild or moderate dehydration can often be done at home by increasing the amount of fluids that you drink. It is best to drink small amounts of fluid more often. Drinking too much at one time can make vomiting worse. Refer to the home care instructions below. Severe dehydration needs to be treated at the hospital where you will probably be given intravenous (IV) fluids that contain water and electrolytes. HOME CARE INSTRUCTIONS   Ask your caregiver about specific rehydration instructions.  Drink enough fluids to keep your urine clear or pale yellow.  Drink small amounts frequently if you have nausea and vomiting.  Eat as you normally do.  Avoid:  Foods or drinks high in sugar.  Carbonated  drinks.  Juice.  Extremely hot or cold fluids.  Drinks with caffeine.  Fatty, greasy foods.  Alcohol.  Tobacco.  Overeating.  Gelatin desserts.  Wash your hands well to avoid spreading bacteria and viruses.  Only take over-the-counter or prescription medicines for pain, discomfort, or fever as directed by your caregiver.  Ask your caregiver if you should continue all prescribed and over-the-counter medicines.  Keep all follow-up appointments with your caregiver. SEEK MEDICAL CARE IF:  You have abdominal pain and it increases or stays in one area (localizes).  You have a rash, stiff neck, or severe headache.  You are irritable, sleepy, or difficult to awaken.  You are weak, dizzy, or extremely thirsty. SEEK IMMEDIATE MEDICAL CARE IF:   You are unable to keep fluids down or you get worse despite treatment.  You have frequent episodes of vomiting or diarrhea.  You have blood or green matter (bile) in your vomit.  You have blood in your stool or your stool looks black and tarry.  You have not urinated in 6 to 8 hours, or you have only urinated a small amount of very dark urine.  You have a fever.  You faint. MAKE SURE YOU:   Understand these instructions.  Will watch your condition.  Will get help right away if you are not doing well or get worse. Document Released: 05/19/2005 Document Revised: 08/11/2011 Document Reviewed: 01/06/2011 [facility] Patient Information 2013 [place], [place]. Pegfilgrastim injection What is this medicine? PEGFILGRASTIM (peg fil GRA stim) helps the body make more white blood cells. It is used to prevent infection in people with  low amounts of white blood cells following cancer treatment. This medicine may be used for other purposes; ask your health care provider or pharmacist if you have questions. What should I tell my health care provider before I take this medicine? They need to know if you have any of these conditions: -sickle  cell disease -an unusual or allergic reaction to pegfilgrastim, filgrastim, E.coli protein, other medicines, foods, dyes, or preservatives -pregnant or trying to get pregnant -breast-feeding How should I use this medicine? This medicine is for injection under the skin. It is usually given by a health care professional in a hospital or clinic setting. If you get this medicine at home, you will be taught how to prepare and give this medicine. Do not shake this medicine. Use exactly as directed. Take your medicine at regular intervals. Do not take your medicine more often than directed. It is important that you put your used needles and syringes in a special sharps container. Do not put them in a trash can. If you do not have a sharps container, call your pharmacist or healthcare provider to get one. Talk to your pediatrician regarding the use of this medicine in children. While this drug may be prescribed for children who weigh more than 45 kg for selected conditions, precautions do apply Overdosage: If you think you have taken too much of this medicine contact a poison control center or emergency room at once. NOTE: This medicine is only for you. Do not share this medicine with others. What if I miss a dose? If you miss a dose, take it as soon as you can. If it is almost time for your next dose, take only that dose. Do not take double or extra doses. What may interact with this medicine? -lithium -medicines for growth therapy This list may not describe all possible interactions. Give your health care provider a list of all the medicines, herbs, non-prescription drugs, or dietary supplements you use. Also tell them if you smoke, drink alcohol, or use illegal drugs. Some items may interact with your medicine. What should I watch for while using this medicine? Visit your doctor for regular check ups. You will need important blood work done while you are taking this medicine. What side effects may I  notice from receiving this medicine? Side effects that you should report to your doctor or health care professional as soon as possible: -allergic reactions like skin rash, itching or hives, swelling of the face, lips, or tongue -breathing problems -fever -pain, redness, or swelling where injected -shoulder pain -stomach or side pain Side effects that usually do not require medical attention (report to your doctor or health care professional if they continue or are bothersome): -aches, pains -headache -loss of appetite -nausea, vomiting -unusually tired This list may not describe all possible side effects. Call your doctor for medical advice about side effects. You may report side effects to FDA at 1-800-FDA-1088. Where should I keep my medicine? Keep out of the reach of children. Store in a refrigerator between 2 and 8 degrees C (36 and 46 degrees F). Do not freeze. Keep in carton to protect from light. Throw away this medicine if it is left out of the refrigerator for more than 48 hours. Throw away any unused medicine after the expiration date. NOTE: This sheet is a summary. It may not cover all possible information. If you have questions about this medicine, talk to your doctor, pharmacist, or health care provider.  2013, [employer]. (12/20/2007 3:41:44  PM)

## 2012-08-31 ENCOUNTER — Encounter: Payer: Self-pay | Admitting: *Deleted

## 2012-09-01 ENCOUNTER — Other Ambulatory Visit (HOSPITAL_BASED_OUTPATIENT_CLINIC_OR_DEPARTMENT_OTHER): Payer: BC Managed Care – PPO | Admitting: Lab

## 2012-09-01 ENCOUNTER — Encounter: Payer: Self-pay | Admitting: Adult Health

## 2012-09-01 ENCOUNTER — Telehealth: Payer: Self-pay | Admitting: *Deleted

## 2012-09-01 ENCOUNTER — Ambulatory Visit (HOSPITAL_BASED_OUTPATIENT_CLINIC_OR_DEPARTMENT_OTHER): Payer: BC Managed Care – PPO | Admitting: Adult Health

## 2012-09-01 ENCOUNTER — Other Ambulatory Visit: Payer: BC Managed Care – PPO | Admitting: Lab

## 2012-09-01 ENCOUNTER — Ambulatory Visit (HOSPITAL_BASED_OUTPATIENT_CLINIC_OR_DEPARTMENT_OTHER): Payer: BC Managed Care – PPO

## 2012-09-01 ENCOUNTER — Ambulatory Visit: Payer: BC Managed Care – PPO | Admitting: Family

## 2012-09-01 VITALS — BP 110/70 | HR 78 | Temp 98.3°F | Resp 20 | Ht 62.0 in | Wt 159.6 lb

## 2012-09-01 DIAGNOSIS — C50419 Malignant neoplasm of upper-outer quadrant of unspecified female breast: Secondary | ICD-10-CM

## 2012-09-01 DIAGNOSIS — C50219 Malignant neoplasm of upper-inner quadrant of unspecified female breast: Secondary | ICD-10-CM

## 2012-09-01 DIAGNOSIS — C50412 Malignant neoplasm of upper-outer quadrant of left female breast: Secondary | ICD-10-CM

## 2012-09-01 DIAGNOSIS — E860 Dehydration: Secondary | ICD-10-CM

## 2012-09-01 DIAGNOSIS — Z5112 Encounter for antineoplastic immunotherapy: Secondary | ICD-10-CM

## 2012-09-01 DIAGNOSIS — Z79899 Other long term (current) drug therapy: Secondary | ICD-10-CM

## 2012-09-01 LAB — CBC WITH DIFFERENTIAL/PLATELET
Basophils Absolute: 0 10*3/uL (ref 0.0–0.1)
Eosinophils Absolute: 0 10*3/uL (ref 0.0–0.5)
HGB: 11 g/dL — ABNORMAL LOW (ref 11.6–15.9)
LYMPH%: 31.3 % (ref 14.0–49.7)
MONO#: 1.7 10*3/uL — ABNORMAL HIGH (ref 0.1–0.9)
NEUT#: 3.9 10*3/uL (ref 1.5–6.5)
Platelets: 272 10*3/uL (ref 145–400)
RBC: 3.6 10*6/uL — ABNORMAL LOW (ref 3.70–5.45)
RDW: 16.5 % — ABNORMAL HIGH (ref 11.2–14.5)
WBC: 8.2 10*3/uL (ref 3.9–10.3)

## 2012-09-01 LAB — COMPREHENSIVE METABOLIC PANEL (CC13)
Albumin: 3.4 g/dL — ABNORMAL LOW (ref 3.5–5.0)
CO2: 25 mEq/L (ref 22–29)
Glucose: 115 mg/dl — ABNORMAL HIGH (ref 70–99)
Potassium: 4.1 mEq/L (ref 3.5–5.1)
Sodium: 138 mEq/L (ref 136–145)
Total Protein: 6.4 g/dL (ref 6.4–8.3)

## 2012-09-01 MED ORDER — SODIUM CHLORIDE 0.9 % IJ SOLN
10.0000 mL | INTRAMUSCULAR | Status: DC | PRN
  Administered 2012-09-01: 10 mL
  Filled 2012-09-01: qty 10

## 2012-09-01 MED ORDER — SODIUM CHLORIDE 0.9 % IV SOLN
Freq: Once | INTRAVENOUS | Status: DC
  Administered 2012-09-01: 12:00:00 via INTRAVENOUS

## 2012-09-01 MED ORDER — ACETAMINOPHEN 325 MG PO TABS
650.0000 mg | ORAL_TABLET | Freq: Once | ORAL | Status: AC
  Administered 2012-09-01: 650 mg via ORAL

## 2012-09-01 MED ORDER — DIPHENHYDRAMINE HCL 25 MG PO CAPS
50.0000 mg | ORAL_CAPSULE | Freq: Once | ORAL | Status: AC
  Administered 2012-09-01: 25 mg via ORAL

## 2012-09-01 MED ORDER — HEPARIN SOD (PORK) LOCK FLUSH 100 UNIT/ML IV SOLN
500.0000 [IU] | Freq: Once | INTRAVENOUS | Status: AC | PRN
  Administered 2012-09-01: 500 [IU]
  Filled 2012-09-01: qty 5

## 2012-09-01 MED ORDER — TRASTUZUMAB CHEMO INJECTION 440 MG
2.0000 mg/kg | Freq: Once | INTRAVENOUS | Status: AC
  Administered 2012-09-01: 147 mg via INTRAVENOUS
  Filled 2012-09-01: qty 7

## 2012-09-01 NOTE — Telephone Encounter (Signed)
appt made and printed 

## 2012-09-01 NOTE — Patient Instructions (Signed)
Doing well.  Proceed with Herceptin today.  Try to increase fluid intake.  Please call us if you have any questions or concerns.

## 2012-09-01 NOTE — Patient Instructions (Addendum)
[facility] Discharge Instructions for Patients Receiving Chemotherapy  Today you received the following chemotherapy agents Herceptin.  To help prevent nausea and vomiting after your treatment, we encourage you to take your nausea medication.   If you develop nausea and vomiting that is not controlled by your nausea medication, call the clinic. If it is after clinic hours your family physician or the after hours number for the clinic or go to the Emergency Department.   BELOW ARE SYMPTOMS THAT SHOULD BE REPORTED IMMEDIATELY:  *FEVER GREATER THAN 100.5 F  *CHILLS WITH OR WITHOUT FEVER  NAUSEA AND VOMITING THAT IS NOT CONTROLLED WITH YOUR NAUSEA MEDICATION  *UNUSUAL SHORTNESS OF BREATH  *UNUSUAL BRUISING OR BLEEDING  TENDERNESS IN MOUTH AND THROAT WITH OR WITHOUT PRESENCE OF ULCERS  *URINARY PROBLEMS  *BOWEL PROBLEMS  UNUSUAL RASH Items with * indicate a potential emergency and should be followed up as soon as possible.  One of the nurses will contact you 24 hours after your treatment. Please let the nurse know about any problems that you may have experienced. Feel free to call the clinic you have any questions or concerns. The clinic phone number is [phone].   I have been informed and understand all the instructions given to me. I know to contact the clinic, my physician, or go to the Emergency Department if any problems should occur. I do not have any questions at this time, but understand that I may call the clinic during office hours   should I have any questions or need assistance in obtaining follow up care.    __________________________________________  _____________  __________ Signature of Patient or Authorized Representative            Date                   Time    __________________________________________ Nurse's Signature    

## 2012-09-01 NOTE — Progress Notes (Signed)
OFFICE PROGRESS NOTE  CCGeorgann Housekeeper, MD [address] [place] [address]  DIAGNOSIS: 67 year old female with invasive ductal carcinoma that was ER +100% PR +100% HER-2/neu positive with a ratio of 2.45. Ki-67 was 55% and elevated tumor was grade 2.  She had a lumpectomy and sentinel node biopsy revealing T1CN1A, stage IIA breast cancer of the left breast.       PRIOR THERAPY: 1. She noted a left breast mass on self throat examination. She underwent a mammogram in December that showed the mass. She went on to have it biopsied on 05/14/2012. The pathology revealed invasive ductal carcinoma that was ER +100% PR +100% HER-2/neu positive with a ratio of 2.45. Ki-67 was 55% and elevated tumor was grade 2. She had an MRI performed that showed the area to be measuring out at 1.6 cm  2. She underwent a lumpectomy with sentinel node biopsy on 06/10/12.  revealing T1CN1A, stage IIA breast cancer of the left breast.  3. She started chemotherapy consisting of TCH on 07/14/12 with 6 cycles planned.    CURRENT THERAPY: TCH C 3 day 8 with weekly herceptin  INTERVAL HISTORY: [name] 67 y.o. female returns for follow up today.  She tolerated her treatment essentially well.  She did become "queezy" starting on Saturday and lasted until Monday.  She took nausea medications which didn't really help tremendously.  She had a decreased appetite, and is taking in approximately 2 bottles of water per day in addition to coffee, ensure, boost, and coke.  She denies vomiting.  She had one episode of loose stool yesterday morning.  Otherwise she denies fevers, chills, vomiting, constipation, numbness or any further concerns.    MEDICAL HISTORY: Past Medical History  Diagnosis Date  . Hyperlipidemia   . Invasive ductal carcinoma of breast 2013    Left  . Arthritis   . Depression   . Heart murmur     heard one when she was pregnamt-maybe had echo-cannot remember  . Hypertension    . GERD (gastroesophageal reflux disease)     ALLERGIES:  has No Known Allergies.  MEDICATIONS:  Current Outpatient Prescriptions  Medication Sig Dispense Refill  . Ascorbic Acid (VITAMIN C) 1000 MG tablet Take 1,000 mg by mouth daily.        Marland Kitchen aspirin 325 MG tablet Take 325 mg by mouth daily.        Marland Kitchen atenolol (TENORMIN) 50 MG tablet Take 50 mg by mouth daily.      . Cholecalciferol (VITAMIN D) 2000 UNITS tablet Take 2,000 Units by mouth daily.      . citalopram (CELEXA) 10 MG tablet Take 10 mg by mouth daily.       Marland Kitchen dexamethasone (DECADRON) 4 MG tablet Take 2 tablets (8 mg total) by mouth 2 (two) times daily with a meal. Take two times a day the day before Taxotere. Then take two times a day starting the day after chemo for 3 days.  30 tablet  1  . lidocaine-prilocaine (EMLA) cream Apply 1 application topically as needed (used for chemo).      . LORazepam (ATIVAN) 0.5 MG tablet Take 1 tablet (0.5 mg total) by mouth every 6 (six) hours as needed (Nausea or vomiting).  30 tablet  0  . olmesartan-hydrochlorothiazide (BENICAR HCT) 40-12.5 MG per tablet Take 1 tablet by mouth daily.      Marland Kitchen omeprazole (PRILOSEC) 40 MG capsule Take 40 mg by mouth daily.      Marland Kitchen  ondansetron (ZOFRAN) 8 MG tablet Take 1 tablet (8 mg total) by mouth 2 (two) times daily. Take two times a day starting the day after chemo for 3 days. Then take two times a day as needed for nausea or vomiting.  30 tablet  1  . prochlorperazine (COMPAZINE) 10 MG tablet Take 1 tablet (10 mg total) by mouth every 6 (six) hours as needed (Nausea or vomiting).  30 tablet  1  . prochlorperazine (COMPAZINE) 25 MG suppository Place 1 suppository (25 mg total) rectally every 12 (twelve) hours as needed for nausea.  12 suppository  3  . simvastatin (ZOCOR) 80 MG tablet Take 40 mg by mouth at bedtime.       . Trospium Chloride 60 MG CP24 Take 60 mg by mouth daily.      . vitamin E 400 UNIT capsule Take 400 Units by mouth daily.         No current  facility-administered medications for this visit.    SURGICAL HISTORY:  Past Surgical History  Procedure Laterality Date  . Tubal ligation  1988  . Bladder suspension  10/2010  . Tonsillectomy and adenoidectomy      age 43  . Abdominal adhesion surgery  1980  . Benign breast biopsy  1994    Left  . Breast lumpectomy with needle localization and axillary sentinel lymph node bx  06/10/2012    Procedure: BREAST LUMPECTOMY WITH NEEDLE LOCALIZATION AND AXILLARY SENTINEL LYMPH NODE BX;  Surgeon: Emelia Loron, MD;  Location: [facility];  Service: General;  Laterality: Left;  . Appendectomy    . Cholecystectomy    . Portacath placement  07/07/2012    Procedure: INSERTION PORT-A-CATH;  Surgeon: Emelia Loron, MD;  Location: [facility];  Service: General;  Laterality: Left;  Port a cath insertion    REVIEW OF SYSTEMS:    General: fatigue (+), night sweats (-), fever (-), pain (-) Lymph: palpable nodes (-) HEENT: vision changes (-), mucositis (-), gum bleeding (-), epistaxis (-) Cardiovascular: chest pain (-), palpitations (-) Pulmonary: shortness of breath (-), dyspnea on exertion (-), cough (-), hemoptysis (-) GI:  Early satiety (-), melena (-), dysphagia (-), nausea/vomiting (+), diarrhea (-) GU: dysuria (-), hematuria (-), incontinence (-) Musculoskeletal: joint swelling (-), joint pain (-), back pain (-) Neuro: weakness (-), numbness (-), headache (-), confusion (-) Skin: Rash (-), lesions (-), dryness (-) Psych: depression (-), suicidal/homicidal ideation (-), feeling of hopelessness (-)   PHYSICAL EXAMINATION: Blood pressure 110/70, pulse 78, temperature 98.3 F (36.8 C), temperature source Oral, resp. rate 20, height 5\' 2"  (1.575 m), weight 159 lb 9.6 oz (72.394 kg). Body mass index is 29.18 kg/(m^2). General: Patient is a well appearing female in no acute distress HEENT: PERRLA, sclerae anicteric no conjunctival pallor, MMM Neck: supple, no  palpable adenopathy Lungs: clear to auscultation bilaterally, no wheezes, rhonchi, or rales Cardiovascular: regular rate rhythm, S1, S2,  Systolic murmur, no rubs or gallops Abdomen: Soft, non-tender, non-distended, normoactive bowel sounds, no HSM Extremities: warm and well perfused, no clubbing, cyanosis, or edema Skin: No rashes or lesions Neuro: Non-focal Breasts:  Left breast incision site well healed, no nodularity, right breast no masses or nodules.  ECOG PERFORMANCE STATUS: 1 - Symptomatic but completely ambulatory      LABORATORY DATA: Lab Results  Component Value Date   WBC 8.2 09/01/2012   HGB 11.0* 09/01/2012   HCT 33.5* 09/01/2012   MCV 93.1 09/01/2012   PLT 272 09/01/2012  Chemistry      Component Value Date/Time   NA 140 08/25/2012 1102   NA 137 08/09/2012 1935   K 4.1 08/25/2012 1102   K 4.2 08/09/2012 1935   CL 108* 08/25/2012 1102   CL 103 08/09/2012 1935   CO2 23 08/25/2012 1102   CO2 24 08/09/2012 1935   BUN 26.5* 08/25/2012 1102   BUN 48* 08/09/2012 1935   CREATININE 1.0 08/25/2012 1102   CREATININE 1.13* 08/09/2012 1935      Component Value Date/Time   CALCIUM 9.9 08/25/2012 1102   CALCIUM 9.3 08/09/2012 1935   ALKPHOS 90 08/25/2012 1102   ALKPHOS 65 06/08/2012 0924   AST 20 08/25/2012 1102   AST 22 06/08/2012 0924   ALT 32 08/25/2012 1102   ALT 21 06/08/2012 0924   BILITOT 0.44 08/25/2012 1102   BILITOT 0.5 06/08/2012 0924     FINAL DIAGNOSIS Diagnosis 1. Breast, lumpectomy, Left - INVASIVE DUCTAL CARCINOMA (1.2CM) SEE COMMENT - LYMPHOVASCULAR INVASION IDENTIFIED. - INVASIVE TUMOR IS LESS THAN 0.1 MM FROM NEAREST MARGIN (ANTERIOR). - LYMPHOVASCULAR INVASION IDENTIFIED. - DUCTAL CARCINOMA IN SITU WITH CALCIFICATIONS AND COMEDO NECROSIS. - IN SITU CARCINOMA IS PRESENT AT ANTERIOR MARGIN. - SEE TUMOR SYNOPTIC TEMPLATE BELOW. 2. Lymph node, sentinel, biopsy, Left - ONE LYMPH NODE, POSITIVE FOR METASTATIC MAMMARY CARCINOMA (1/1) - TUMOR DEPOSIT IS 1.1CM - NO  EXTRACAPSULAR TUMOR EXTENSION PRESENT. Microscopic Comment 1. BREAST, INVASIVE TUMOR, WITH LYMPH NODE SAMPLING Specimen, including laterality: Left breast. Procedure: Lumpectomy. Grade: II of III Tubule formation: 3 Nuclear pleomorphism: 2 Mitotic:1 Tumor size (gross measurement): 1.2 cm Margins: Invasive, distance to closest margin: Less than 0.1 mm, see comment. In-situ, distance to closest margin: Present at anterior margin. If margin positive, focally or broadly: Focally Lymphovascular invasion: Present. Ductal carcinoma in situ: Present Grade: III of III Extensive intraductal component: Absent. 1 of 3 FINAL for CELESE, BANNER ([id number]) Microscopic Comment(continued) Lobular neoplasia: Absent. Tumor focality: Unifocal. Treatment effect: None. If present, treatment effect in breast tissue, lymph nodes or both: N/A Extent of tumor: Skin: Negative for tumor. Nipple: N/A Skeletal muscle: N/A Lymph nodes: # examined: 1 Lymph nodes with metastasis: 1 Macrometastasis: (> 2.0 mm): 1.1 cm Extracapsular extension: Absent. Breast prognostic profile: Estrogen receptor: Not repeated, previous study demonstrated 100% positivity ([id number]) Progesterone receptor: Not repeated, previous study demonstrated 100% positivity ([id number]) Her 2 neu: Repeated, previous study demonstrated amplification at (2.45) ([id number]). Ki-67: Not repeated, previous study demonstrated 55% proliferation rate. Non-neoplastic breast: Previous biopsy site, vascular calcifications, benign fibrocytic change, and microcalcifications in benign ducts and lobules. TNM: pT1c, pN1a, pMX Comments: Although invasive tumor extends into cauterized tissue, there is no invasive tumor definitively identified at the cauterized edge (slides 1A-1C). (BJS:gt, 06/14/12) [place] RUND DO Pathologist, Electronic Signature (Case signed 06/14/2012) Specimen Gross and Clinical Information Specimen(s) Obtained: 1. Breast,  lumpectomy, Left 2. Lymph node, sentinel, biopsy, Left Specimen Clinical Information  RADIOGRAPHIC STUDIES:   ASSESSMENT:  67 year old female with  #1 new diagnosis of 1.6 cm (by MRI) invasive ductal carcinoma grade 2 ER +100% PR +100% HER-2/neu amplified with a ratio of 2.45 with an elevated Ki-67 of 55%. Patient is status post biopsy on 05/14/2012. Patient found a mass on self breast examination. Patient was seen by Dr. Harden Mo and he feels that the patient is a breast conservation candidate with lumpectomy and sentinel lymph node biopsy. She is now being seen for discussion of adjuvant treatment post lumpectomy.   #2 patient and I discussed her pathology  in detail. We discussed the significance of grade as well as elevated Ki-67 and HER-2/neu positivity. We discussed use of HER-2 based treatment such as with Herceptin and chemotherapy. We discussed combination Herceptin and chemotherapy consisting of Taxotere carboplatinum and Herceptin. The Taxotere and carboplatinum would be given every 3 weeks for a total of 6 cycles. Herceptin would be given initially weekly with her chemotherapy and then changed to every 3 weeks to finish out one year of HER-2 therapy.   #3 patient certainly will need radiation therapy and she will be seen by Dr. Doristine Devoid.   #4 because patient's tumor is estrogen receptor positive she would also received adjuvant antiestrogen therapy in the form of an aromatase inhibitors and she is postmenopausal. We discussed the different aromatase inhibitors.    PLAN:   1. Ms. Utt is doing essentially well today.  She will continue to drink fluids and proceed with Herceptin today.  I will add on IV fluids today to her treatment due to increased PO intake.   I also referred her to Dr. Gala Romney in May 2014.    2. I will see her back next week for weekly Herceptin.    All questions were answered. The patient knows to call the clinic with any problems, questions or  concerns. We can certainly see the patient much sooner if necessary.  I spent 25 minutes counseling the patient face to face. The total time spent in the appointment was 30 minutes.   Cherie Ouch Lyn Hollingshead, NP Medical Oncology [facility] Phone: [phone] 09/01/2012, 10:40 AM

## 2012-09-02 ENCOUNTER — Telehealth: Payer: Self-pay | Admitting: *Deleted

## 2012-09-02 NOTE — Telephone Encounter (Signed)
sw pt she is aware of her appt change and will attend her appt for 09/06/12.

## 2012-09-06 ENCOUNTER — Ambulatory Visit (HOSPITAL_BASED_OUTPATIENT_CLINIC_OR_DEPARTMENT_OTHER): Payer: BC Managed Care – PPO | Admitting: Oncology

## 2012-09-06 ENCOUNTER — Encounter: Payer: Self-pay | Admitting: Oncology

## 2012-09-06 ENCOUNTER — Other Ambulatory Visit (HOSPITAL_BASED_OUTPATIENT_CLINIC_OR_DEPARTMENT_OTHER): Payer: BC Managed Care – PPO | Admitting: Lab

## 2012-09-06 VITALS — BP 115/70 | HR 76 | Temp 98.2°F | Resp 20 | Ht 62.0 in | Wt 160.9 lb

## 2012-09-06 DIAGNOSIS — Z170 Estrogen receptor positive status [ER+]: Secondary | ICD-10-CM

## 2012-09-06 DIAGNOSIS — C50219 Malignant neoplasm of upper-inner quadrant of unspecified female breast: Secondary | ICD-10-CM

## 2012-09-06 DIAGNOSIS — C50412 Malignant neoplasm of upper-outer quadrant of left female breast: Secondary | ICD-10-CM

## 2012-09-06 DIAGNOSIS — C50419 Malignant neoplasm of upper-outer quadrant of unspecified female breast: Secondary | ICD-10-CM

## 2012-09-06 DIAGNOSIS — C50912 Malignant neoplasm of unspecified site of left female breast: Secondary | ICD-10-CM

## 2012-09-06 LAB — COMPREHENSIVE METABOLIC PANEL (CC13)
Alkaline Phosphatase: 139 U/L (ref 40–150)
CO2: 27 mEq/L (ref 22–29)
Creatinine: 1 mg/dL (ref 0.6–1.1)
Glucose: 122 mg/dl — ABNORMAL HIGH (ref 70–99)
Total Bilirubin: 0.49 mg/dL (ref 0.20–1.20)

## 2012-09-06 LAB — CBC WITH DIFFERENTIAL/PLATELET
BASO%: 0.2 % (ref 0.0–2.0)
Eosinophils Absolute: 0 10*3/uL (ref 0.0–0.5)
HCT: 32 % — ABNORMAL LOW (ref 34.8–46.6)
LYMPH%: 16.8 % (ref 14.0–49.7)
MCHC: 32.8 g/dL (ref 31.5–36.0)
MCV: 94.1 fL (ref 79.5–101.0)
MONO#: 1 10*3/uL — ABNORMAL HIGH (ref 0.1–0.9)
MONO%: 5.7 % (ref 0.0–14.0)
NEUT%: 77.2 % — ABNORMAL HIGH (ref 38.4–76.8)
Platelets: 139 10*3/uL — ABNORMAL LOW (ref 145–400)
WBC: 17.3 10*3/uL — ABNORMAL HIGH (ref 3.9–10.3)

## 2012-09-06 NOTE — Progress Notes (Signed)
OFFICE PROGRESS NOTE  CCGeorgann Housekeeper, MD [address] [place] [address]  DIAGNOSIS: 67 year old female with invasive ductal carcinoma that was ER +100% PR +100% HER-2/neu positive with a ratio of 2.45. Ki-67 was 55% and elevated tumor was grade 2.  She had a lumpectomy and sentinel node biopsy revealing T1CN1A, stage IIA breast cancer of the left breast.       PRIOR THERAPY: 1. She noted a left breast mass on self throat examination. She underwent a mammogram in December that showed the mass. She went on to have it biopsied on 05/14/2012. The pathology revealed invasive ductal carcinoma that was ER +100% PR +100% HER-2/neu positive with a ratio of 2.45. Ki-67 was 55% and elevated tumor was grade 2. She had an MRI performed that showed the area to be measuring out at 1.6 cm  2. She underwent a lumpectomy with sentinel node biopsy on 06/10/12.  revealing T1CN1A, stage IIA breast cancer of the left breast.  3. She started chemotherapy consisting of TCH on 07/14/12 with 6 cycles planned.    CURRENT THERAPY:  weekly herceptin today.  INTERVAL HISTORY: [name] 67 y.o. female returns for follow up today.  She tolerated her treatment essentially well.  She had a decreased appetite, and is taking in approximately 2 bottles of water per day in addition to coffee, ensure, boost, and coke.  She denies vomiting.  She had one episode of loose stool yesterday morning.  Otherwise she denies fevers, chills, vomiting, constipation, numbness or any further concerns.    MEDICAL HISTORY: Past Medical History  Diagnosis Date  . Hyperlipidemia   . Invasive ductal carcinoma of breast 2013    Left  . Arthritis   . Depression   . Heart murmur     heard one when she was pregnamt-maybe had echo-cannot remember  . Hypertension   . GERD (gastroesophageal reflux disease)     ALLERGIES:  has No Known Allergies.  MEDICATIONS:  Current Outpatient Prescriptions  Medication  Sig Dispense Refill  . Ascorbic Acid (VITAMIN C) 1000 MG tablet Take 1,000 mg by mouth daily.        Marland Kitchen aspirin 325 MG tablet Take 325 mg by mouth daily.        Marland Kitchen atenolol (TENORMIN) 50 MG tablet Take 50 mg by mouth daily.      . Cholecalciferol (VITAMIN D) 2000 UNITS tablet Take 2,000 Units by mouth daily.      . citalopram (CELEXA) 10 MG tablet Take 10 mg by mouth daily.       Marland Kitchen dexamethasone (DECADRON) 4 MG tablet Take 2 tablets (8 mg total) by mouth 2 (two) times daily with a meal. Take two times a day the day before Taxotere. Then take two times a day starting the day after chemo for 3 days.  30 tablet  1  . lidocaine-prilocaine (EMLA) cream Apply 1 application topically as needed (used for chemo).      . LORazepam (ATIVAN) 0.5 MG tablet Take 1 tablet (0.5 mg total) by mouth every 6 (six) hours as needed (Nausea or vomiting).  30 tablet  0  . olmesartan-hydrochlorothiazide (BENICAR HCT) 40-12.5 MG per tablet Take 1 tablet by mouth daily.      Marland Kitchen omeprazole (PRILOSEC) 40 MG capsule Take 40 mg by mouth daily.      . ondansetron (ZOFRAN) 8 MG tablet Take 1 tablet (8 mg total) by mouth 2 (two) times daily. Take two times a day  starting the day after chemo for 3 days. Then take two times a day as needed for nausea or vomiting.  30 tablet  1  . prochlorperazine (COMPAZINE) 10 MG tablet Take 1 tablet (10 mg total) by mouth every 6 (six) hours as needed (Nausea or vomiting).  30 tablet  1  . simvastatin (ZOCOR) 80 MG tablet Take 40 mg by mouth at bedtime.       . vitamin E 400 UNIT capsule Take 400 Units by mouth daily.        . prochlorperazine (COMPAZINE) 25 MG suppository Place 1 suppository (25 mg total) rectally every 12 (twelve) hours as needed for nausea.  12 suppository  3  . Trospium Chloride 60 MG CP24 Take 60 mg by mouth daily.       No current facility-administered medications for this visit.    SURGICAL HISTORY:  Past Surgical History  Procedure Laterality Date  . Tubal ligation  1988   . Bladder suspension  10/2010  . Tonsillectomy and adenoidectomy      age 37  . Abdominal adhesion surgery  1980  . Benign breast biopsy  1994    Left  . Breast lumpectomy with needle localization and axillary sentinel lymph node bx  06/10/2012    Procedure: BREAST LUMPECTOMY WITH NEEDLE LOCALIZATION AND AXILLARY SENTINEL LYMPH NODE BX;  Surgeon: Emelia Loron, MD;  Location: [facility];  Service: General;  Laterality: Left;  . Appendectomy    . Cholecystectomy    . Portacath placement  07/07/2012    Procedure: INSERTION PORT-A-CATH;  Surgeon: Emelia Loron, MD;  Location: [facility];  Service: General;  Laterality: Left;  Port a cath insertion    REVIEW OF SYSTEMS:    General: fatigue (+), night sweats (-), fever (-), pain (-) Lymph: palpable nodes (-) HEENT: vision changes (-), mucositis (-), gum bleeding (-), epistaxis (-) Cardiovascular: chest pain (-), palpitations (-) Pulmonary: shortness of breath (-), dyspnea on exertion (-), cough (-), hemoptysis (-) GI:  Early satiety (-), melena (-), dysphagia (-), nausea/vomiting (+), diarrhea (-) GU: dysuria (-), hematuria (-), incontinence (-) Musculoskeletal: joint swelling (-), joint pain (-), back pain (-) Neuro: weakness (-), numbness (-), headache (-), confusion (-) Skin: Rash (-), lesions (-), dryness (-) Psych: depression (-), suicidal/homicidal ideation (-), feeling of hopelessness (-)   PHYSICAL EXAMINATION: Blood pressure 115/70, pulse 76, temperature 98.2 F (36.8 C), temperature source Oral, resp. rate 20, height 5\' 2"  (1.575 m), weight 160 lb 14.4 oz (72.984 kg). Body mass index is 29.42 kg/(m^2). General: Patient is a well appearing female in no acute distress HEENT: PERRLA, sclerae anicteric no conjunctival pallor, MMM Neck: supple, no palpable adenopathy Lungs: clear to auscultation bilaterally, no wheezes, rhonchi, or rales Cardiovascular: regular rate rhythm, S1, S2,  Systolic  murmur, no rubs or gallops Abdomen: Soft, non-tender, non-distended, normoactive bowel sounds, no HSM Extremities: warm and well perfused, no clubbing, cyanosis, or edema Skin: No rashes or lesions Neuro: Non-focal Breasts:  Left breast incision site well healed, no nodularity, right breast no masses or nodules.  ECOG PERFORMANCE STATUS: 1 - Symptomatic but completely ambulatory      LABORATORY DATA: Lab Results  Component Value Date   WBC 17.3* 09/06/2012   HGB 10.5* 09/06/2012   HCT 32.0* 09/06/2012   MCV 94.1 09/06/2012   PLT 139* 09/06/2012      Chemistry      Component Value Date/Time   NA 138 09/01/2012 0958   NA  137 08/09/2012 1935   K 4.1 09/01/2012 0958   K 4.2 08/09/2012 1935   CL 104 09/01/2012 0958   CL 103 08/09/2012 1935   CO2 25 09/01/2012 0958   CO2 24 08/09/2012 1935   BUN 23.4 09/01/2012 0958   BUN 48* 08/09/2012 1935   CREATININE 1.0 09/01/2012 0958   CREATININE 1.13* 08/09/2012 1935      Component Value Date/Time   CALCIUM 9.6 09/01/2012 0958   CALCIUM 9.3 08/09/2012 1935   ALKPHOS 107 09/01/2012 0958   ALKPHOS 65 06/08/2012 0924   AST 17 09/01/2012 0958   AST 22 06/08/2012 0924   ALT 27 09/01/2012 0958   ALT 21 06/08/2012 0924   BILITOT 0.45 09/01/2012 0958   BILITOT 0.5 06/08/2012 0924     FINAL DIAGNOSIS Diagnosis 1. Breast, lumpectomy, Left - INVASIVE DUCTAL CARCINOMA (1.2CM) SEE COMMENT - LYMPHOVASCULAR INVASION IDENTIFIED. - INVASIVE TUMOR IS LESS THAN 0.1 MM FROM NEAREST MARGIN (ANTERIOR). - LYMPHOVASCULAR INVASION IDENTIFIED. - DUCTAL CARCINOMA IN SITU WITH CALCIFICATIONS AND COMEDO NECROSIS. - IN SITU CARCINOMA IS PRESENT AT ANTERIOR MARGIN. - SEE TUMOR SYNOPTIC TEMPLATE BELOW. 2. Lymph node, sentinel, biopsy, Left - ONE LYMPH NODE, POSITIVE FOR METASTATIC MAMMARY CARCINOMA (1/1) - TUMOR DEPOSIT IS 1.1CM - NO EXTRACAPSULAR TUMOR EXTENSION PRESENT. Microscopic Comment 1. BREAST, INVASIVE TUMOR, WITH LYMPH NODE SAMPLING Specimen, including laterality: Left  breast. Procedure: Lumpectomy. Grade: II of III Tubule formation: 3 Nuclear pleomorphism: 2 Mitotic:1 Tumor size (gross measurement): 1.2 cm Margins: Invasive, distance to closest margin: Less than 0.1 mm, see comment. In-situ, distance to closest margin: Present at anterior margin. If margin positive, focally or broadly: Focally Lymphovascular invasion: Present. Ductal carcinoma in situ: Present Grade: III of III Extensive intraductal component: Absent. 1 of 3 FINAL for SHIRLETTE, SCARBER ([id number]) Microscopic Comment(continued) Lobular neoplasia: Absent. Tumor focality: Unifocal. Treatment effect: None. If present, treatment effect in breast tissue, lymph nodes or both: N/A Extent of tumor: Skin: Negative for tumor. Nipple: N/A Skeletal muscle: N/A Lymph nodes: # examined: 1 Lymph nodes with metastasis: 1 Macrometastasis: (> 2.0 mm): 1.1 cm Extracapsular extension: Absent. Breast prognostic profile: Estrogen receptor: Not repeated, previous study demonstrated 100% positivity ([id number]) Progesterone receptor: Not repeated, previous study demonstrated 100% positivity ([id number]) Her 2 neu: Repeated, previous study demonstrated amplification at (2.45) ([id number]). Ki-67: Not repeated, previous study demonstrated 55% proliferation rate. Non-neoplastic breast: Previous biopsy site, vascular calcifications, benign fibrocytic change, and microcalcifications in benign ducts and lobules. TNM: pT1c, pN1a, pMX Comments: Although invasive tumor extends into cauterized tissue, there is no invasive tumor definitively identified at the cauterized edge (slides 1A-1C). (BJS:gt, 06/14/12) [place] RUND DO Pathologist, Electronic Signature (Case signed 06/14/2012) Specimen Gross and Clinical Information Specimen(s) Obtained: 1. Breast, lumpectomy, Left 2. Lymph node, sentinel, biopsy, Left Specimen Clinical Information  RADIOGRAPHIC STUDIES:   ASSESSMENT:  67 year old female  with  #1 new diagnosis of 1.6 cm (by MRI) invasive ductal carcinoma grade 2 ER +100% PR +100% HER-2/neu amplified with a ratio of 2.45 with an elevated Ki-67 of 55%. Patient is status post biopsy on 05/14/2012. Patient found a mass on self breast examination. Patient was seen by Dr. Harden Mo and he feels that the patient is a breast conservation candidate with lumpectomy and sentinel lymph node biopsy. She is now being seen for discussion of adjuvant treatment post lumpectomy.   #2 patient and I discussed her pathology in detail. We discussed the significance of grade as well as elevated Ki-67 and HER-2/neu positivity. We discussed  use of HER-2 based treatment such as with Herceptin and chemotherapy. We discussed combination Herceptin and chemotherapy consisting of Taxotere carboplatinum and Herceptin. The Taxotere and carboplatinum would be given every 3 weeks for a total of 6 cycles. Herceptin would be given initially weekly with her chemotherapy and then changed to every 3 weeks to finish out one year of HER-2 therapy.   #3 patient certainly will need radiation therapy and she will be seen by Dr. Doristine Devoid.   #4 because patient's tumor is estrogen receptor positive she would also received adjuvant antiestrogen therapy in the form of an aromatase inhibitors and she is postmenopausal. We discussed the different aromatase inhibitors.    PLAN:  Patient will proceed with Herceptin only today.  #2 she will return in one week's time for cycle #4 of TCH.  #3 once she finishes 6 cycles of TCH we will plan on referring her to radiation oncology. Patient is very much interested in having a few weeks off and I think that is okay after she completes her chemotherapy well she is waiting to get started on radiation. We certainly will have to continue Herceptin every 3 weeks starting June 26.  All questions were answered. The patient knows to call the clinic with any problems, questions or concerns. We  can certainly see the patient much sooner if necessary.  I spent 25 minutes counseling the patient face to face. The total time spent in the appointment was 30 minutes.   Drue Second, MD Medical/Oncology [facility] [phone] (beeper) [phone] (Office)  09/06/2012, 1:51 PM

## 2012-09-06 NOTE — Patient Instructions (Signed)
Proceed with heceptin on 4/8  We will see you back on 4/16 for cycle 4 of [facility]

## 2012-09-07 ENCOUNTER — Other Ambulatory Visit: Payer: BC Managed Care – PPO | Admitting: Lab

## 2012-09-07 ENCOUNTER — Ambulatory Visit (HOSPITAL_BASED_OUTPATIENT_CLINIC_OR_DEPARTMENT_OTHER): Payer: BC Managed Care – PPO

## 2012-09-07 ENCOUNTER — Ambulatory Visit: Payer: BC Managed Care – PPO | Admitting: Oncology

## 2012-09-07 VITALS — BP 107/67 | HR 76 | Temp 98.9°F | Resp 18

## 2012-09-07 DIAGNOSIS — Z5112 Encounter for antineoplastic immunotherapy: Secondary | ICD-10-CM

## 2012-09-07 DIAGNOSIS — C50219 Malignant neoplasm of upper-inner quadrant of unspecified female breast: Secondary | ICD-10-CM

## 2012-09-07 DIAGNOSIS — C50419 Malignant neoplasm of upper-outer quadrant of unspecified female breast: Secondary | ICD-10-CM

## 2012-09-07 MED ORDER — SODIUM CHLORIDE 0.9 % IJ SOLN
10.0000 mL | INTRAMUSCULAR | Status: DC | PRN
  Administered 2012-09-07: 10 mL
  Filled 2012-09-07: qty 10

## 2012-09-07 MED ORDER — DIPHENHYDRAMINE HCL 25 MG PO CAPS
50.0000 mg | ORAL_CAPSULE | Freq: Once | ORAL | Status: AC
  Administered 2012-09-07: 25 mg via ORAL

## 2012-09-07 MED ORDER — SODIUM CHLORIDE 0.9 % IV SOLN
Freq: Once | INTRAVENOUS | Status: AC
  Administered 2012-09-07: 12:00:00 via INTRAVENOUS

## 2012-09-07 MED ORDER — HEPARIN SOD (PORK) LOCK FLUSH 100 UNIT/ML IV SOLN
500.0000 [IU] | Freq: Once | INTRAVENOUS | Status: AC | PRN
  Administered 2012-09-07: 500 [IU]
  Filled 2012-09-07: qty 5

## 2012-09-07 MED ORDER — ACETAMINOPHEN 325 MG PO TABS
650.0000 mg | ORAL_TABLET | Freq: Once | ORAL | Status: AC
  Administered 2012-09-07: 325 mg via ORAL

## 2012-09-07 MED ORDER — TRASTUZUMAB CHEMO INJECTION 440 MG
2.0000 mg/kg | Freq: Once | INTRAVENOUS | Status: AC
  Administered 2012-09-07: 147 mg via INTRAVENOUS
  Filled 2012-09-07: qty 7

## 2012-09-07 NOTE — Patient Instructions (Addendum)

## 2012-09-08 ENCOUNTER — Other Ambulatory Visit: Payer: BC Managed Care – PPO | Admitting: Lab

## 2012-09-08 ENCOUNTER — Ambulatory Visit: Payer: BC Managed Care – PPO

## 2012-09-08 ENCOUNTER — Ambulatory Visit: Payer: BC Managed Care – PPO | Admitting: Family

## 2012-09-15 ENCOUNTER — Other Ambulatory Visit (HOSPITAL_BASED_OUTPATIENT_CLINIC_OR_DEPARTMENT_OTHER): Payer: BC Managed Care – PPO | Admitting: Lab

## 2012-09-15 ENCOUNTER — Ambulatory Visit: Payer: BC Managed Care – PPO | Admitting: Family

## 2012-09-15 ENCOUNTER — Ambulatory Visit: Payer: BC Managed Care – PPO

## 2012-09-15 ENCOUNTER — Other Ambulatory Visit: Payer: BC Managed Care – PPO | Admitting: Lab

## 2012-09-15 ENCOUNTER — Ambulatory Visit (HOSPITAL_BASED_OUTPATIENT_CLINIC_OR_DEPARTMENT_OTHER): Payer: BC Managed Care – PPO | Admitting: Adult Health

## 2012-09-15 ENCOUNTER — Encounter: Payer: Self-pay | Admitting: Adult Health

## 2012-09-15 ENCOUNTER — Ambulatory Visit (HOSPITAL_BASED_OUTPATIENT_CLINIC_OR_DEPARTMENT_OTHER): Payer: BC Managed Care – PPO

## 2012-09-15 ENCOUNTER — Other Ambulatory Visit: Payer: Self-pay | Admitting: Emergency Medicine

## 2012-09-15 VITALS — BP 123/71 | HR 81 | Temp 97.8°F | Resp 20 | Ht 62.0 in | Wt 165.1 lb

## 2012-09-15 DIAGNOSIS — Z170 Estrogen receptor positive status [ER+]: Secondary | ICD-10-CM

## 2012-09-15 DIAGNOSIS — C50412 Malignant neoplasm of upper-outer quadrant of left female breast: Secondary | ICD-10-CM

## 2012-09-15 DIAGNOSIS — C50419 Malignant neoplasm of upper-outer quadrant of unspecified female breast: Secondary | ICD-10-CM

## 2012-09-15 DIAGNOSIS — C50219 Malignant neoplasm of upper-inner quadrant of unspecified female breast: Secondary | ICD-10-CM

## 2012-09-15 DIAGNOSIS — Z5111 Encounter for antineoplastic chemotherapy: Secondary | ICD-10-CM

## 2012-09-15 DIAGNOSIS — Z5112 Encounter for antineoplastic immunotherapy: Secondary | ICD-10-CM

## 2012-09-15 LAB — CBC WITH DIFFERENTIAL/PLATELET
BASO%: 0.1 % (ref 0.0–2.0)
Eosinophils Absolute: 0 10*3/uL (ref 0.0–0.5)
LYMPH%: 12.3 % — ABNORMAL LOW (ref 14.0–49.7)
MCHC: 33.5 g/dL (ref 31.5–36.0)
MCV: 92.4 fL (ref 79.5–101.0)
MONO%: 3.5 % (ref 0.0–14.0)
Platelets: 123 10*3/uL — ABNORMAL LOW (ref 145–400)
RBC: 3.04 10*6/uL — ABNORMAL LOW (ref 3.70–5.45)

## 2012-09-15 LAB — COMPREHENSIVE METABOLIC PANEL (CC13)
Alkaline Phosphatase: 90 U/L (ref 40–150)
Glucose: 113 mg/dl — ABNORMAL HIGH (ref 70–99)
Sodium: 140 mEq/L (ref 136–145)
Total Bilirubin: 0.6 mg/dL (ref 0.20–1.20)
Total Protein: 6.9 g/dL (ref 6.4–8.3)

## 2012-09-15 MED ORDER — HEPARIN SOD (PORK) LOCK FLUSH 100 UNIT/ML IV SOLN
500.0000 [IU] | Freq: Once | INTRAVENOUS | Status: AC | PRN
  Administered 2012-09-15: 500 [IU]
  Filled 2012-09-15: qty 5

## 2012-09-15 MED ORDER — DOCETAXEL CHEMO INJECTION 160 MG/16ML
75.0000 mg/m2 | Freq: Once | INTRAVENOUS | Status: AC
  Administered 2012-09-15: 130 mg via INTRAVENOUS
  Filled 2012-09-15: qty 13

## 2012-09-15 MED ORDER — SODIUM CHLORIDE 0.9 % IV SOLN
Freq: Once | INTRAVENOUS | Status: AC
  Administered 2012-09-15: 12:00:00 via INTRAVENOUS

## 2012-09-15 MED ORDER — DIPHENHYDRAMINE HCL 25 MG PO CAPS
50.0000 mg | ORAL_CAPSULE | Freq: Once | ORAL | Status: AC
  Administered 2012-09-15: 50 mg via ORAL

## 2012-09-15 MED ORDER — UNABLE TO FIND: Status: DC

## 2012-09-15 MED ORDER — ACETAMINOPHEN 325 MG PO TABS
650.0000 mg | ORAL_TABLET | Freq: Once | ORAL | Status: AC
  Administered 2012-09-15: 650 mg via ORAL

## 2012-09-15 MED ORDER — SODIUM CHLORIDE 0.9 % IV SOLN
524.4000 mg | Freq: Once | INTRAVENOUS | Status: AC
  Administered 2012-09-15: 520 mg via INTRAVENOUS
  Filled 2012-09-15: qty 52

## 2012-09-15 MED ORDER — DEXAMETHASONE SODIUM PHOSPHATE 4 MG/ML IJ SOLN
20.0000 mg | Freq: Once | INTRAMUSCULAR | Status: AC
  Administered 2012-09-15: 20 mg via INTRAVENOUS

## 2012-09-15 MED ORDER — SODIUM CHLORIDE 0.9 % IV SOLN
2.0000 mg/kg | Freq: Once | INTRAVENOUS | Status: AC
  Administered 2012-09-15: 147 mg via INTRAVENOUS
  Filled 2012-09-15: qty 7

## 2012-09-15 MED ORDER — ONDANSETRON 16 MG/50ML IVPB (CHCC)
16.0000 mg | Freq: Once | INTRAVENOUS | Status: AC
  Administered 2012-09-15: 16 mg via INTRAVENOUS

## 2012-09-15 MED ORDER — SODIUM CHLORIDE 0.9 % IJ SOLN
10.0000 mL | INTRAMUSCULAR | Status: DC | PRN
  Administered 2012-09-15: 10 mL
  Filled 2012-09-15: qty 10

## 2012-09-15 NOTE — Progress Notes (Signed)
OFFICE PROGRESS NOTE  CCGeorgann Housekeeper, MD [address] [place] [address]  DIAGNOSIS: 67 year old female with invasive ductal carcinoma that was ER +100% PR +100% HER-2/neu positive with a ratio of 2.45. Ki-67 was 55% and elevated tumor was grade 2.  She had a lumpectomy and sentinel node biopsy revealing T1CN1A, stage IIA breast cancer of the left breast.       PRIOR THERAPY: 1. She noted a left breast mass on self throat examination. She underwent a mammogram in December that showed the mass. She went on to have it biopsied on 05/14/2012. The pathology revealed invasive ductal carcinoma that was ER +100% PR +100% HER-2/neu positive with a ratio of 2.45. Ki-67 was 55% and elevated tumor was grade 2. She had an MRI performed that showed the area to be measuring out at 1.6 cm  2. She underwent a lumpectomy with sentinel node biopsy on 06/10/12.  revealing T1CN1A, stage IIA breast cancer of the left breast.  3. She started chemotherapy consisting of TCH on 07/14/12 with 6 cycles planned.    CURRENT THERAPY:  [facility] C4D1 with weekly herceptin  INTERVAL HISTORY: [name] 67 y.o. female returns for follow up today.  She is here for cycle four.  She is c/o a cold that started yesterday.  She has a sore throat, cough, and is beginning to lose her voice.  She has been in contact with her boss who has had the same thing.  She denies fevers, chills, nausea, vomiting, constipation, diarrhea, numbness.  Her appetite is good, she does endorse taste changes but continues to be able to eat her meals.  She is drinking approximately 2 bottles of water, ensure, tea, coffee, tomato juice, every day.  Otherwise a 10 point ROS is neg.   MEDICAL HISTORY: Past Medical History  Diagnosis Date  . Hyperlipidemia   . Invasive ductal carcinoma of breast 2013    Left  . Arthritis   . Depression   . Heart murmur     heard one when she was pregnamt-maybe had echo-cannot remember  .  Hypertension   . GERD (gastroesophageal reflux disease)     ALLERGIES:  has No Known Allergies.  MEDICATIONS:  Current Outpatient Prescriptions  Medication Sig Dispense Refill  . Ascorbic Acid (VITAMIN C) 1000 MG tablet Take 1,000 mg by mouth daily.        Marland Kitchen aspirin 81 MG tablet Take 81 mg by mouth daily.      Marland Kitchen atenolol (TENORMIN) 50 MG tablet Take 50 mg by mouth daily.      . Cholecalciferol (VITAMIN D) 2000 UNITS tablet Take 2,000 Units by mouth daily.      . citalopram (CELEXA) 10 MG tablet Take 10 mg by mouth daily.       Marland Kitchen dexamethasone (DECADRON) 4 MG tablet Take 2 tablets (8 mg total) by mouth 2 (two) times daily with a meal. Take two times a day the day before Taxotere. Then take two times a day starting the day after chemo for 3 days.  30 tablet  1  . lidocaine-prilocaine (EMLA) cream Apply 1 application topically as needed (used for chemo).      . LORazepam (ATIVAN) 0.5 MG tablet Take 1 tablet (0.5 mg total) by mouth every 6 (six) hours as needed (Nausea or vomiting).  30 tablet  0  . olmesartan-hydrochlorothiazide (BENICAR HCT) 40-12.5 MG per tablet Take 1 tablet by mouth daily.      Marland Kitchen omeprazole (  PRILOSEC) 40 MG capsule Take 40 mg by mouth daily.      . ondansetron (ZOFRAN) 8 MG tablet Take 1 tablet (8 mg total) by mouth 2 (two) times daily. Take two times a day starting the day after chemo for 3 days. Then take two times a day as needed for nausea or vomiting.  30 tablet  1  . prochlorperazine (COMPAZINE) 10 MG tablet Take 1 tablet (10 mg total) by mouth every 6 (six) hours as needed (Nausea or vomiting).  30 tablet  1  . prochlorperazine (COMPAZINE) 25 MG suppository Place 1 suppository (25 mg total) rectally every 12 (twelve) hours as needed for nausea.  12 suppository  3  . simvastatin (ZOCOR) 80 MG tablet Take 40 mg by mouth at bedtime.       . Trospium Chloride 60 MG CP24 Take 60 mg by mouth daily.      . vitamin E 400 UNIT capsule Take 400 Units by mouth daily.        Marland Kitchen  UNABLE TO FIND Cranial prosthesis due to chemotherapy induced alopecia.  1 Units  0   No current facility-administered medications for this visit.    SURGICAL HISTORY:  Past Surgical History  Procedure Laterality Date  . Tubal ligation  1988  . Bladder suspension  10/2010  . Tonsillectomy and adenoidectomy      age 21  . Abdominal adhesion surgery  1980  . Benign breast biopsy  1994    Left  . Breast lumpectomy with needle localization and axillary sentinel lymph node bx  06/10/2012    Procedure: BREAST LUMPECTOMY WITH NEEDLE LOCALIZATION AND AXILLARY SENTINEL LYMPH NODE BX;  Surgeon: Emelia Loron, MD;  Location: [facility];  Service: General;  Laterality: Left;  . Appendectomy    . Cholecystectomy    . Portacath placement  07/07/2012    Procedure: INSERTION PORT-A-CATH;  Surgeon: Emelia Loron, MD;  Location: [facility];  Service: General;  Laterality: Left;  Port a cath insertion    REVIEW OF SYSTEMS:    General: fatigue (+), night sweats (-), fever (-), pain (-) Lymph: palpable nodes (-) HEENT: vision changes (-), mucositis (-), gum bleeding (-), epistaxis (-) Cardiovascular: chest pain (-), palpitations (-) Pulmonary: shortness of breath (-), dyspnea on exertion (-), cough (-), hemoptysis (-) GI:  Early satiety (-), melena (-), dysphagia (-), nausea/vomiting (-), diarrhea (-) GU: dysuria (-), hematuria (-), incontinence (-) Musculoskeletal: joint swelling (-), joint pain (-), back pain (-) Neuro: weakness (-), numbness (-), headache (-), confusion (-) Skin: Rash (-), lesions (-), dryness (-) Psych: depression (-), suicidal/homicidal ideation (-), feeling of hopelessness (-)   PHYSICAL EXAMINATION: Blood pressure 123/71, pulse 81, temperature 97.8 F (36.6 C), temperature source Oral, resp. rate 20, height 5\' 2"  (1.575 m), weight 165 lb 1.6 oz (74.889 kg). Body mass index is 30.19 kg/(m^2). General: Patient is a well appearing female in no  acute distress HEENT: PERRLA, sclerae anicteric no conjunctival pallor, MMM Neck: supple, no palpable adenopathy Lungs: clear to auscultation bilaterally, no wheezes, rhonchi, or rales Cardiovascular: regular rate rhythm, S1, S2,  Systolic murmur, no rubs or gallops Abdomen: Soft, non-tender, non-distended, normoactive bowel sounds, no HSM Extremities: warm and well perfused, no clubbing, cyanosis, or edema Skin: No rashes or lesions Neuro: Non-focal Breasts:  Left breast incision site well healed, no nodularity, right breast no masses or nodules.  ECOG PERFORMANCE STATUS: 1 - Symptomatic but completely ambulatory      LABORATORY  DATA: Lab Results  Component Value Date   WBC 8.9 09/15/2012   HGB 9.4* 09/15/2012   HCT 28.1* 09/15/2012   MCV 92.4 09/15/2012   PLT 123* 09/15/2012      Chemistry      Component Value Date/Time   NA 140 09/06/2012 1238   NA 137 08/09/2012 1935   K 3.8 09/06/2012 1238   K 4.2 08/09/2012 1935   CL 104 09/06/2012 1238   CL 103 08/09/2012 1935   CO2 27 09/06/2012 1238   CO2 24 08/09/2012 1935   BUN 22.0 09/06/2012 1238   BUN 48* 08/09/2012 1935   CREATININE 1.0 09/06/2012 1238   CREATININE 1.13* 08/09/2012 1935      Component Value Date/Time   CALCIUM 9.7 09/06/2012 1238   CALCIUM 9.3 08/09/2012 1935   ALKPHOS 139 09/06/2012 1238   ALKPHOS 65 06/08/2012 0924   AST 26 09/06/2012 1238   AST 22 06/08/2012 0924   ALT 34 09/06/2012 1238   ALT 21 06/08/2012 0924   BILITOT 0.49 09/06/2012 1238   BILITOT 0.5 06/08/2012 0924     FINAL DIAGNOSIS Diagnosis 1. Breast, lumpectomy, Left - INVASIVE DUCTAL CARCINOMA (1.2CM) SEE COMMENT - LYMPHOVASCULAR INVASION IDENTIFIED. - INVASIVE TUMOR IS LESS THAN 0.1 MM FROM NEAREST MARGIN (ANTERIOR). - LYMPHOVASCULAR INVASION IDENTIFIED. - DUCTAL CARCINOMA IN SITU WITH CALCIFICATIONS AND COMEDO NECROSIS. - IN SITU CARCINOMA IS PRESENT AT ANTERIOR MARGIN. - SEE TUMOR SYNOPTIC TEMPLATE BELOW. 2. Lymph node, sentinel, biopsy, Left - ONE LYMPH NODE,  POSITIVE FOR METASTATIC MAMMARY CARCINOMA (1/1) - TUMOR DEPOSIT IS 1.1CM - NO EXTRACAPSULAR TUMOR EXTENSION PRESENT. Microscopic Comment 1. BREAST, INVASIVE TUMOR, WITH LYMPH NODE SAMPLING Specimen, including laterality: Left breast. Procedure: Lumpectomy. Grade: II of III Tubule formation: 3 Nuclear pleomorphism: 2 Mitotic:1 Tumor size (gross measurement): 1.2 cm Margins: Invasive, distance to closest margin: Less than 0.1 mm, see comment. In-situ, distance to closest margin: Present at anterior margin. If margin positive, focally or broadly: Focally Lymphovascular invasion: Present. Ductal carcinoma in situ: Present Grade: III of III Extensive intraductal component: Absent. 1 of 3 FINAL for CAMERAN, AHMED ([id number]) Microscopic Comment(continued) Lobular neoplasia: Absent. Tumor focality: Unifocal. Treatment effect: None. If present, treatment effect in breast tissue, lymph nodes or both: N/A Extent of tumor: Skin: Negative for tumor. Nipple: N/A Skeletal muscle: N/A Lymph nodes: # examined: 1 Lymph nodes with metastasis: 1 Macrometastasis: (> 2.0 mm): 1.1 cm Extracapsular extension: Absent. Breast prognostic profile: Estrogen receptor: Not repeated, previous study demonstrated 100% positivity ([id number]) Progesterone receptor: Not repeated, previous study demonstrated 100% positivity ([id number]) Her 2 neu: Repeated, previous study demonstrated amplification at (2.45) ([id number]). Ki-67: Not repeated, previous study demonstrated 55% proliferation rate. Non-neoplastic breast: Previous biopsy site, vascular calcifications, benign fibrocytic change, and microcalcifications in benign ducts and lobules. TNM: pT1c, pN1a, pMX Comments: Although invasive tumor extends into cauterized tissue, there is no invasive tumor definitively identified at the cauterized edge (slides 1A-1C). (BJS:gt, 06/14/12) [place] RUND DO Pathologist, Electronic Signature (Case signed  06/14/2012) Specimen Gross and Clinical Information Specimen(s) Obtained: 1. Breast, lumpectomy, Left 2. Lymph node, sentinel, biopsy, Left Specimen Clinical Information  RADIOGRAPHIC STUDIES:   ASSESSMENT:  67 year old female with  #1 new diagnosis of 1.6 cm (by MRI) invasive ductal carcinoma grade 2 ER +100% PR +100% HER-2/neu amplified with a ratio of 2.45 with an elevated Ki-67 of 55%. Patient is status post biopsy on 05/14/2012. Patient found a mass on self breast examination. Patient was seen by Dr. Harden Mo and he  feels that the patient is a breast conservation candidate with lumpectomy and sentinel lymph node biopsy. She is now being seen for discussion of adjuvant treatment post lumpectomy.   #2 patient and I discussed her pathology in detail. We discussed the significance of grade as well as elevated Ki-67 and HER-2/neu positivity. We discussed use of HER-2 based treatment such as with Herceptin and chemotherapy. We discussed combination Herceptin and chemotherapy consisting of Taxotere carboplatinum and Herceptin. The Taxotere and carboplatinum would be given every 3 weeks for a total of 6 cycles. Herceptin would be given initially weekly with her chemotherapy and then changed to every 3 weeks to finish out one year of HER-2 therapy.   #3 patient certainly will need radiation therapy and she will be seen by Dr. Doristine Devoid.   #4 because patient's tumor is estrogen receptor positive she would also received adjuvant antiestrogen therapy in the form of an aromatase inhibitors and she is postmenopausal. We discussed the different aromatase inhibitors.    PLAN:  #1 Doing well.  Patient will proceed with chemotherapy today.  I gave her a prescription for a wig today.      #2 She will return in one weeks time for weekly herceptin.    #3 once she finishes 6 cycles of TCH we will plan on referring her to radiation oncology. Patient is very much interested in having a few weeks off  and I think that is okay after she completes her chemotherapy well she is waiting to get started on radiation. We certainly will have to continue Herceptin every 3 weeks starting June 26.  All questions were answered. The patient knows to call the clinic with any problems, questions or concerns. We can certainly see the patient much sooner if necessary.  I spent 25 minutes counseling the patient face to face. The total time spent in the appointment was 30 minutes.  Cherie Ouch Lyn Hollingshead, NP Medical Oncology [facility] Phone: [phone]    09/15/2012, 10:45 AM

## 2012-09-15 NOTE — Patient Instructions (Addendum)
[facility] Discharge Instructions for Patients Receiving Chemotherapy  Today you received the following chemotherapy agents Carboplatin,Taxotere,Herceptin  To help prevent nausea and vomiting after your treatment, we encourage you to take your nausea medication as prescribed.  If you develop nausea and vomiting that is not controlled by your nausea medication, call the clinic. If it is after clinic hours your family physician or the after hours number for the clinic or go to the Emergency Department.   BELOW ARE SYMPTOMS THAT SHOULD BE REPORTED IMMEDIATELY:  *FEVER GREATER THAN 100.5 F  *CHILLS WITH OR WITHOUT FEVER  NAUSEA AND VOMITING THAT IS NOT CONTROLLED WITH YOUR NAUSEA MEDICATION  *UNUSUAL SHORTNESS OF BREATH  *UNUSUAL BRUISING OR BLEEDING  TENDERNESS IN MOUTH AND THROAT WITH OR WITHOUT PRESENCE OF ULCERS  *URINARY PROBLEMS  *BOWEL PROBLEMS  UNUSUAL RASH Items with * indicate a potential emergency and should be followed up as soon as possible.  . Feel free to call the clinic you have any questions or concerns. The clinic phone number is [phone].   I have been informed and understand all the instructions given to me. I know to contact the clinic, my physician, or go to the Emergency Department if any problems should occur. I do not have any questions at this time, but understand that I may call the clinic during office hours   should I have any questions or need assistance in obtaining follow up care.    __________________________________________  _____________  __________ Signature of Patient or Authorized Representative            Date                   Time    __________________________________________ Nurse's Signature

## 2012-09-15 NOTE — Patient Instructions (Addendum)
Doing well.  Proceed with chemotherapy.  Please call us if you have any questions or concerns.    

## 2012-09-16 ENCOUNTER — Ambulatory Visit (HOSPITAL_BASED_OUTPATIENT_CLINIC_OR_DEPARTMENT_OTHER): Payer: BC Managed Care – PPO

## 2012-09-16 VITALS — BP 117/65 | HR 70 | Temp 98.2°F

## 2012-09-16 DIAGNOSIS — C50219 Malignant neoplasm of upper-inner quadrant of unspecified female breast: Secondary | ICD-10-CM

## 2012-09-16 DIAGNOSIS — Z5189 Encounter for other specified aftercare: Secondary | ICD-10-CM

## 2012-09-16 DIAGNOSIS — C50419 Malignant neoplasm of upper-outer quadrant of unspecified female breast: Secondary | ICD-10-CM

## 2012-09-16 MED ORDER — PEGFILGRASTIM INJECTION 6 MG/0.6ML
6.0000 mg | Freq: Once | SUBCUTANEOUS | Status: AC
  Administered 2012-09-16: 6 mg via SUBCUTANEOUS
  Filled 2012-09-16: qty 0.6

## 2012-09-22 ENCOUNTER — Encounter: Payer: Self-pay | Admitting: Adult Health

## 2012-09-22 ENCOUNTER — Ambulatory Visit (HOSPITAL_BASED_OUTPATIENT_CLINIC_OR_DEPARTMENT_OTHER): Payer: BC Managed Care – PPO | Admitting: Adult Health

## 2012-09-22 ENCOUNTER — Other Ambulatory Visit (HOSPITAL_BASED_OUTPATIENT_CLINIC_OR_DEPARTMENT_OTHER): Payer: BC Managed Care – PPO | Admitting: Lab

## 2012-09-22 ENCOUNTER — Ambulatory Visit (HOSPITAL_BASED_OUTPATIENT_CLINIC_OR_DEPARTMENT_OTHER): Payer: BC Managed Care – PPO

## 2012-09-22 VITALS — BP 97/61 | HR 84 | Temp 98.0°F | Resp 20 | Ht 62.0 in | Wt 162.4 lb

## 2012-09-22 DIAGNOSIS — C50419 Malignant neoplasm of upper-outer quadrant of unspecified female breast: Secondary | ICD-10-CM

## 2012-09-22 DIAGNOSIS — C50219 Malignant neoplasm of upper-inner quadrant of unspecified female breast: Secondary | ICD-10-CM

## 2012-09-22 DIAGNOSIS — C50412 Malignant neoplasm of upper-outer quadrant of left female breast: Secondary | ICD-10-CM

## 2012-09-22 DIAGNOSIS — Z5112 Encounter for antineoplastic immunotherapy: Secondary | ICD-10-CM

## 2012-09-22 DIAGNOSIS — E860 Dehydration: Secondary | ICD-10-CM

## 2012-09-22 DIAGNOSIS — R110 Nausea: Secondary | ICD-10-CM

## 2012-09-22 DIAGNOSIS — Z170 Estrogen receptor positive status [ER+]: Secondary | ICD-10-CM

## 2012-09-22 LAB — CBC WITH DIFFERENTIAL/PLATELET
Basophils Absolute: 0 10*3/uL (ref 0.0–0.1)
Eosinophils Absolute: 0.1 10*3/uL (ref 0.0–0.5)
HCT: 27.7 % — ABNORMAL LOW (ref 34.8–46.6)
HGB: 8.9 g/dL — ABNORMAL LOW (ref 11.6–15.9)
MCH: 30.6 pg (ref 25.1–34.0)
MONO#: 0.9 10*3/uL (ref 0.1–0.9)
NEUT#: 2.3 10*3/uL (ref 1.5–6.5)
NEUT%: 45 % (ref 38.4–76.8)
RDW: 17.8 % — ABNORMAL HIGH (ref 11.2–14.5)
lymph#: 1.8 10*3/uL (ref 0.9–3.3)

## 2012-09-22 LAB — COMPREHENSIVE METABOLIC PANEL (CC13)
ALT: 17 U/L (ref 0–55)
AST: 15 U/L (ref 5–34)
Alkaline Phosphatase: 94 U/L (ref 40–150)
Glucose: 109 mg/dl — ABNORMAL HIGH (ref 70–99)
Sodium: 140 mEq/L (ref 136–145)
Total Bilirubin: 0.46 mg/dL (ref 0.20–1.20)
Total Protein: 6.5 g/dL (ref 6.4–8.3)

## 2012-09-22 MED ORDER — ONDANSETRON 8 MG/50ML IVPB (CHCC): 8.0000 mg | Freq: Once | INTRAVENOUS | Status: DC

## 2012-09-22 MED ORDER — ACETAMINOPHEN 325 MG PO TABS
650.0000 mg | ORAL_TABLET | Freq: Once | ORAL | Status: AC
  Administered 2012-09-22: 650 mg via ORAL

## 2012-09-22 MED ORDER — SODIUM CHLORIDE 0.9 % IJ SOLN
10.0000 mL | INTRAMUSCULAR | Status: DC | PRN
  Administered 2012-09-22: 10 mL
  Filled 2012-09-22: qty 10

## 2012-09-22 MED ORDER — SODIUM CHLORIDE 0.9 % IV SOLN
Freq: Once | INTRAVENOUS | Status: AC
  Administered 2012-09-22: 10:00:00 via INTRAVENOUS

## 2012-09-22 MED ORDER — SODIUM CHLORIDE 0.9 % IV SOLN: 1000.0000 mL | Freq: Once | INTRAVENOUS | Status: DC

## 2012-09-22 MED ORDER — HEPARIN SOD (PORK) LOCK FLUSH 100 UNIT/ML IV SOLN
500.0000 [IU] | Freq: Once | INTRAVENOUS | Status: AC | PRN
  Administered 2012-09-22: 500 [IU]
  Filled 2012-09-22: qty 5

## 2012-09-22 MED ORDER — DIPHENHYDRAMINE HCL 25 MG PO CAPS
50.0000 mg | ORAL_CAPSULE | Freq: Once | ORAL | Status: AC
  Administered 2012-09-22: 25 mg via ORAL

## 2012-09-22 MED ORDER — TRASTUZUMAB CHEMO INJECTION 440 MG
2.0000 mg/kg | Freq: Once | INTRAVENOUS | Status: AC
  Administered 2012-09-22: 147 mg via INTRAVENOUS
  Filled 2012-09-22: qty 7

## 2012-09-22 NOTE — Patient Instructions (Addendum)

## 2012-09-22 NOTE — Progress Notes (Signed)
OFFICE PROGRESS NOTE  CCGeorgann Housekeeper, MD [address] [place] [address]  DIAGNOSIS: 67 year old female with invasive ductal carcinoma that was ER +100% PR +100% HER-2/neu positive with a ratio of 2.45. Ki-67 was 55% and elevated tumor was grade 2.  She had a lumpectomy and sentinel node biopsy revealing T1CN1A, stage IIA breast cancer of the left breast.       PRIOR THERAPY: 1. She noted a left breast mass on self throat examination. She underwent a mammogram in December that showed the mass. She went on to have it biopsied on 05/14/2012. The pathology revealed invasive ductal carcinoma that was ER +100% PR +100% HER-2/neu positive with a ratio of 2.45. Ki-67 was 55% and elevated tumor was grade 2. She had an MRI performed that showed the area to be measuring out at 1.6 cm  2. She underwent a lumpectomy with sentinel node biopsy on 06/10/12.  revealing T1CN1A, stage IIA breast cancer of the left breast.  3. She started chemotherapy consisting of TCH on 07/14/12 with 6 cycles planned.    CURRENT THERAPY:  [facility] [id number] with weekly herceptin  INTERVAL HISTORY: [name] 67 y.o. female returns for follow up today of her left breast invasive ductal carcinoma.  She completed cycle 4 of TCH last week, and is here for evaluation and weekly Herceptin.  She had a rough weekend.  She was very nauseated that started Saturday, she was dizzy, she slept a lot, had a decreased appetite, and decreased fluid intake.  She had to force herself to eat toast on Sunday evening.  She took her anti-emetics when she experienced nausea, and they helped minimally.  She spent a lot of time in bed.  She started to feel better yesterday.  She is not currently nauseated.  She denies fever, chills, vomiting, constipation, diarrhea, numbness, skin changes, or any other concerns.  She is having normal bowel movements.  She denies abdominal pain or reflux.  She is not currently dizzy.  Otherwise, a  10 point ROS is neg.   MEDICAL HISTORY: Past Medical History  Diagnosis Date  . Hyperlipidemia   . Invasive ductal carcinoma of breast 2013    Left  . Arthritis   . Depression   . Heart murmur     heard one when she was pregnamt-maybe had echo-cannot remember  . Hypertension   . GERD (gastroesophageal reflux disease)     ALLERGIES:  has No Known Allergies.  MEDICATIONS:  Current Outpatient Prescriptions  Medication Sig Dispense Refill  . Ascorbic Acid (VITAMIN C) 1000 MG tablet Take 1,000 mg by mouth daily.        Marland Kitchen aspirin 81 MG tablet Take 81 mg by mouth daily.      Marland Kitchen atenolol (TENORMIN) 50 MG tablet Take 50 mg by mouth daily.      . Cholecalciferol (VITAMIN D) 2000 UNITS tablet Take 2,000 Units by mouth daily.      . citalopram (CELEXA) 10 MG tablet Take 10 mg by mouth daily.       Marland Kitchen lidocaine-prilocaine (EMLA) cream Apply 1 application topically as needed (used for chemo).      . LORazepam (ATIVAN) 0.5 MG tablet Take 1 tablet (0.5 mg total) by mouth every 6 (six) hours as needed (Nausea or vomiting).  30 tablet  0  . olmesartan-hydrochlorothiazide (BENICAR HCT) 40-12.5 MG per tablet Take 1 tablet by mouth daily.      Marland Kitchen omeprazole (PRILOSEC) 40 MG capsule Take  40 mg by mouth daily.      . ondansetron (ZOFRAN) 8 MG tablet Take 1 tablet (8 mg total) by mouth 2 (two) times daily. Take two times a day starting the day after chemo for 3 days. Then take two times a day as needed for nausea or vomiting.  30 tablet  1  . prochlorperazine (COMPAZINE) 10 MG tablet Take 1 tablet (10 mg total) by mouth every 6 (six) hours as needed (Nausea or vomiting).  30 tablet  1  . prochlorperazine (COMPAZINE) 25 MG suppository Place 1 suppository (25 mg total) rectally every 12 (twelve) hours as needed for nausea.  12 suppository  3  . simvastatin (ZOCOR) 80 MG tablet Take 40 mg by mouth at bedtime.       . Trospium Chloride 60 MG CP24 Take 60 mg by mouth daily.      Marland Kitchen UNABLE TO FIND Cranial  prosthesis due to chemotherapy induced alopecia.  1 Units  0  . vitamin E 400 UNIT capsule Take 400 Units by mouth daily.        Marland Kitchen dexamethasone (DECADRON) 4 MG tablet Take 2 tablets (8 mg total) by mouth 2 (two) times daily with a meal. Take two times a day the day before Taxotere. Then take two times a day starting the day after chemo for 3 days.  30 tablet  1   No current facility-administered medications for this visit.    SURGICAL HISTORY:  Past Surgical History  Procedure Laterality Date  . Tubal ligation  1988  . Bladder suspension  10/2010  . Tonsillectomy and adenoidectomy      age 69  . Abdominal adhesion surgery  1980  . Benign breast biopsy  1994    Left  . Breast lumpectomy with needle localization and axillary sentinel lymph node bx  06/10/2012    Procedure: BREAST LUMPECTOMY WITH NEEDLE LOCALIZATION AND AXILLARY SENTINEL LYMPH NODE BX;  Surgeon: Emelia Loron, MD;  Location: [facility];  Service: General;  Laterality: Left;  . Appendectomy    . Cholecystectomy    . Portacath placement  07/07/2012    Procedure: INSERTION PORT-A-CATH;  Surgeon: Emelia Loron, MD;  Location: [facility];  Service: General;  Laterality: Left;  Port a cath insertion    REVIEW OF SYSTEMS:    General: fatigue (+), night sweats (-), fever (-), pain (-) Lymph: palpable nodes (-) HEENT: vision changes (-), mucositis (-), gum bleeding (-), epistaxis (-) Cardiovascular: chest pain (-), palpitations (-) Pulmonary: shortness of breath (-), dyspnea on exertion (-), cough (-), hemoptysis (-) GI:  Early satiety (-), melena (-), dysphagia (-), nausea/vomiting (+), diarrhea (-) GU: dysuria (-), hematuria (-), incontinence (-) Musculoskeletal: joint swelling (-), joint pain (-), back pain (-) Neuro: weakness (-), numbness (-), headache (-), confusion (-) Skin: Rash (-), lesions (-), dryness (-) Psych: depression (-), suicidal/homicidal ideation (-), feeling of  hopelessness (-)   PHYSICAL EXAMINATION: Blood pressure 97/61, pulse 84, temperature 98 F (36.7 C), temperature source Oral, resp. rate 20, height 5\' 2"  (1.575 m), weight 162 lb 6.4 oz (73.664 kg). Body mass index is 29.7 kg/(m^2). General: Patient is a well appearing female in no acute distress HEENT: PERRLA, sclerae anicteric no conjunctival pallor, MMM Neck: supple, no palpable adenopathy Lungs: clear to auscultation bilaterally, no wheezes, rhonchi, or rales Cardiovascular: regular rate rhythm, S1, S2,  Systolic murmur, no rubs or gallops Abdomen: Soft, non-tender, non-distended, normoactive bowel sounds, no HSM Extremities: warm and well  perfused, no clubbing, cyanosis, or edema Skin: No rashes or lesions Neuro: Non-focal Breasts:  Left breast incision site well healed, no nodularity, right breast no masses or nodules.  ECOG PERFORMANCE STATUS: 1 - Symptomatic but completely ambulatory      LABORATORY DATA: Lab Results  Component Value Date   WBC 5.0 09/22/2012   HGB 8.9* 09/22/2012   HCT 27.7* 09/22/2012   MCV 95.2 09/22/2012   PLT 192 09/22/2012      Chemistry      Component Value Date/Time   NA 140 09/15/2012 1004   NA 137 08/09/2012 1935   K 4.3 09/15/2012 1004   K 4.2 08/09/2012 1935   CL 107 09/15/2012 1004   CL 103 08/09/2012 1935   CO2 24 09/15/2012 1004   CO2 24 08/09/2012 1935   BUN 27.8* 09/15/2012 1004   BUN 48* 08/09/2012 1935   CREATININE 0.9 09/15/2012 1004   CREATININE 1.13* 08/09/2012 1935      Component Value Date/Time   CALCIUM 9.7 09/15/2012 1004   CALCIUM 9.3 08/09/2012 1935   ALKPHOS 90 09/15/2012 1004   ALKPHOS 65 06/08/2012 0924   AST 17 09/15/2012 1004   AST 22 06/08/2012 0924   ALT 24 09/15/2012 1004   ALT 21 06/08/2012 0924   BILITOT 0.60 09/15/2012 1004   BILITOT 0.5 06/08/2012 0924     FINAL DIAGNOSIS Diagnosis 1. Breast, lumpectomy, Left - INVASIVE DUCTAL CARCINOMA (1.2CM) SEE COMMENT - LYMPHOVASCULAR INVASION IDENTIFIED. - INVASIVE TUMOR IS LESS  THAN 0.1 MM FROM NEAREST MARGIN (ANTERIOR). - LYMPHOVASCULAR INVASION IDENTIFIED. - DUCTAL CARCINOMA IN SITU WITH CALCIFICATIONS AND COMEDO NECROSIS. - IN SITU CARCINOMA IS PRESENT AT ANTERIOR MARGIN. - SEE TUMOR SYNOPTIC TEMPLATE BELOW. 2. Lymph node, sentinel, biopsy, Left - ONE LYMPH NODE, POSITIVE FOR METASTATIC MAMMARY CARCINOMA (1/1) - TUMOR DEPOSIT IS 1.1CM - NO EXTRACAPSULAR TUMOR EXTENSION PRESENT. Microscopic Comment 1. BREAST, INVASIVE TUMOR, WITH LYMPH NODE SAMPLING Specimen, including laterality: Left breast. Procedure: Lumpectomy. Grade: II of III Tubule formation: 3 Nuclear pleomorphism: 2 Mitotic:1 Tumor size (gross measurement): 1.2 cm Margins: Invasive, distance to closest margin: Less than 0.1 mm, see comment. In-situ, distance to closest margin: Present at anterior margin. If margin positive, focally or broadly: Focally Lymphovascular invasion: Present. Ductal carcinoma in situ: Present Grade: III of III Extensive intraductal component: Absent. 1 of 3 FINAL for [name], [name] ([id number]) Microscopic Comment(continued) Lobular neoplasia: Absent. Tumor focality: Unifocal. Treatment effect: None. If present, treatment effect in breast tissue, lymph nodes or both: N/A Extent of tumor: Skin: Negative for tumor. Nipple: N/A Skeletal muscle: N/A Lymph nodes: # examined: 1 Lymph nodes with metastasis: 1 Macrometastasis: (> 2.0 mm): 1.1 cm Extracapsular extension: Absent. Breast prognostic profile: Estrogen receptor: Not repeated, previous study demonstrated 100% positivity ([id number]) Progesterone receptor: Not repeated, previous study demonstrated 100% positivity ([id number]) Her 2 neu: Repeated, previous study demonstrated amplification at (2.45) ([id number]). Ki-67: Not repeated, previous study demonstrated 55% proliferation rate. Non-neoplastic breast: Previous biopsy site, vascular calcifications, benign fibrocytic change, and  microcalcifications in benign ducts and lobules. TNM: pT1c, pN1a, pMX Comments: Although invasive tumor extends into cauterized tissue, there is no invasive tumor definitively identified at the cauterized edge (slides 1A-1C). (BJS:gt, 06/14/12) [place] RUND DO Pathologist, Electronic Signature (Case signed 06/14/2012) Specimen Gross and Clinical Information Specimen(s) Obtained: 1. Breast, lumpectomy, Left 2. Lymph node, sentinel, biopsy, Left Specimen Clinical Information  RADIOGRAPHIC STUDIES:   ASSESSMENT:  67 year old female with  #1 new diagnosis of 1.6 cm (by MRI) invasive  ductal carcinoma grade 2 ER +100% PR +100% HER-2/neu amplified with a ratio of 2.45 with an elevated Ki-67 of 55%. Patient is status post biopsy on 05/14/2012. Patient found a mass on self breast examination. Patient was seen by Dr. Harden Mo and he feels that the patient is a breast conservation candidate with lumpectomy and sentinel lymph node biopsy. She is now being seen for discussion of adjuvant treatment post lumpectomy.   #2 patient and I discussed her pathology in detail. We discussed the significance of grade as well as elevated Ki-67 and HER-2/neu positivity. We discussed use of HER-2 based treatment such as with Herceptin and chemotherapy. We discussed combination Herceptin and chemotherapy consisting of Taxotere carboplatinum and Herceptin. The Taxotere and carboplatinum would be given every 3 weeks for a total of 6 cycles. Herceptin would be given initially weekly with her chemotherapy and then changed to every 3 weeks to finish out one year of HER-2 therapy.   #3 patient certainly will need radiation therapy and she will be seen by Dr. Doristine Devoid.   #4 because patient's tumor is estrogen receptor positive she would also received adjuvant antiestrogen therapy in the form of an aromatase inhibitors and she is postmenopausal. We discussed the different aromatase inhibitors.    PLAN:  #1 Ms. Mckinstry  had a rough time with this cycle of chemotherapy.  Her labs look good, and she will proceed with Herceptin today.   #2 She is dehydrated due to the severe nausea she experienced this weekend.  She continues to have intermittent nausea and has mild hypotension.  Due to these factors we will give her IV fluids today, 1 L NS, and IV anti-emetics.        #3 She will return in one weeks time for weekly herceptin.    #4 once she finishes 6 cycles of TCH we will plan on referring her to radiation oncology. Patient is very much interested in having a few weeks off and I think that is okay after she completes her chemotherapy well she is waiting to get started on radiation.  She will continue Herceptin every 3 weeks starting June 26.  All questions were answered. The patient knows to call the clinic with any problems, questions or concerns. We can certainly see the patient much sooner if necessary.  I spent 25 minutes counseling the patient face to face. The total time spent in the appointment was 30 minutes.  Cherie Ouch Lyn Hollingshead, NP Medical Oncology [facility] Phone: [phone] 09/22/2012, 9:00 AM

## 2012-09-22 NOTE — Patient Instructions (Addendum)
Continue to drink fluids, try to drink 4 16 ounce bottles of water per day.  Proceed with Herceptin.  We will give you IV fluids and nausea medication today.  Please call if you develop nausea as you did this past weekend and we can arrange for additional IV fluids and anti-emetics.

## 2012-09-27 ENCOUNTER — Encounter: Payer: Self-pay | Admitting: Oncology

## 2012-09-27 NOTE — Progress Notes (Signed)
Patient left a message on voicemail. I called back and had to leave her a message.

## 2012-09-29 ENCOUNTER — Other Ambulatory Visit (HOSPITAL_BASED_OUTPATIENT_CLINIC_OR_DEPARTMENT_OTHER): Payer: BC Managed Care – PPO | Admitting: Lab

## 2012-09-29 ENCOUNTER — Ambulatory Visit (HOSPITAL_BASED_OUTPATIENT_CLINIC_OR_DEPARTMENT_OTHER): Payer: BC Managed Care – PPO

## 2012-09-29 ENCOUNTER — Encounter: Payer: Self-pay | Admitting: Oncology

## 2012-09-29 ENCOUNTER — Ambulatory Visit (HOSPITAL_BASED_OUTPATIENT_CLINIC_OR_DEPARTMENT_OTHER): Payer: BC Managed Care – PPO | Admitting: Adult Health

## 2012-09-29 ENCOUNTER — Encounter: Payer: Self-pay | Admitting: Adult Health

## 2012-09-29 ENCOUNTER — Other Ambulatory Visit: Payer: Self-pay | Admitting: *Deleted

## 2012-09-29 VITALS — BP 101/58 | HR 86 | Temp 98.4°F | Resp 20 | Ht 62.0 in | Wt 163.3 lb

## 2012-09-29 DIAGNOSIS — C50419 Malignant neoplasm of upper-outer quadrant of unspecified female breast: Secondary | ICD-10-CM

## 2012-09-29 DIAGNOSIS — Z170 Estrogen receptor positive status [ER+]: Secondary | ICD-10-CM

## 2012-09-29 DIAGNOSIS — C50412 Malignant neoplasm of upper-outer quadrant of left female breast: Secondary | ICD-10-CM

## 2012-09-29 DIAGNOSIS — R350 Frequency of micturition: Secondary | ICD-10-CM

## 2012-09-29 DIAGNOSIS — E860 Dehydration: Secondary | ICD-10-CM

## 2012-09-29 DIAGNOSIS — Z5112 Encounter for antineoplastic immunotherapy: Secondary | ICD-10-CM

## 2012-09-29 LAB — COMPREHENSIVE METABOLIC PANEL (CC13)
AST: 17 U/L (ref 5–34)
Albumin: 3.4 g/dL — ABNORMAL LOW (ref 3.5–5.0)
Alkaline Phosphatase: 105 U/L (ref 40–150)
Glucose: 92 mg/dl (ref 70–99)
Potassium: 4.3 mEq/L (ref 3.5–5.1)
Sodium: 141 mEq/L (ref 136–145)
Total Protein: 6.7 g/dL (ref 6.4–8.3)

## 2012-09-29 LAB — CBC WITH DIFFERENTIAL/PLATELET
Basophils Absolute: 0 10*3/uL (ref 0.0–0.1)
Eosinophils Absolute: 0 10*3/uL (ref 0.0–0.5)
LYMPH%: 19.9 % (ref 14.0–49.7)
MCV: 96.2 fL (ref 79.5–101.0)
MONO%: 6 % (ref 0.0–14.0)
NEUT#: 8.7 10*3/uL — ABNORMAL HIGH (ref 1.5–6.5)
Platelets: 112 10*3/uL — ABNORMAL LOW (ref 145–400)
RBC: 2.86 10*6/uL — ABNORMAL LOW (ref 3.70–5.45)

## 2012-09-29 MED ORDER — TRASTUZUMAB CHEMO INJECTION 440 MG
2.0000 mg/kg | Freq: Once | INTRAVENOUS | Status: AC
  Administered 2012-09-29: 147 mg via INTRAVENOUS
  Filled 2012-09-29: qty 7

## 2012-09-29 MED ORDER — ACETAMINOPHEN 325 MG PO TABS
650.0000 mg | ORAL_TABLET | Freq: Once | ORAL | Status: AC
  Administered 2012-09-29: 650 mg via ORAL

## 2012-09-29 MED ORDER — HEPARIN SOD (PORK) LOCK FLUSH 100 UNIT/ML IV SOLN
500.0000 [IU] | Freq: Once | INTRAVENOUS | Status: AC | PRN
  Administered 2012-09-29: 500 [IU]
  Filled 2012-09-29: qty 5

## 2012-09-29 MED ORDER — SODIUM CHLORIDE 0.9 % IV SOLN
Freq: Once | INTRAVENOUS | Status: AC
  Administered 2012-09-29: 10:00:00 via INTRAVENOUS

## 2012-09-29 MED ORDER — DIPHENHYDRAMINE HCL 25 MG PO CAPS
50.0000 mg | ORAL_CAPSULE | Freq: Once | ORAL | Status: AC
  Administered 2012-09-29: 50 mg via ORAL

## 2012-09-29 MED ORDER — SODIUM CHLORIDE 0.9 % IV SOLN
1000.0000 mL | Freq: Once | INTRAVENOUS | Status: DC
  Administered 2012-09-29: 1000 mL via INTRAVENOUS

## 2012-09-29 MED ORDER — SODIUM CHLORIDE 0.9 % IJ SOLN
10.0000 mL | INTRAMUSCULAR | Status: DC | PRN
  Administered 2012-09-29: 10 mL
  Filled 2012-09-29: qty 10

## 2012-09-29 NOTE — Progress Notes (Signed)
OFFICE PROGRESS NOTE  CCGeorgann Housekeeper, MD [address] [place] [address]  DIAGNOSIS: 67 year old female with invasive ductal carcinoma that was ER +100% PR +100% HER-2/neu positive with a ratio of 2.45. Ki-67 was 55% and elevated tumor was grade 2.  She had a lumpectomy and sentinel node biopsy revealing T1CN1A, stage IIA breast cancer of the left breast.       PRIOR THERAPY: 1. She noted a left breast mass on self throat examination. She underwent a mammogram in December that showed the mass. She went on to have it biopsied on 05/14/2012. The pathology revealed invasive ductal carcinoma that was ER +100% PR +100% HER-2/neu positive with a ratio of 2.45. Ki-67 was 55% and elevated tumor was grade 2. She had an MRI performed that showed the area to be measuring out at 1.6 cm  2. She underwent a lumpectomy with sentinel node biopsy on 06/10/12.  revealing T1CN1A, stage IIA breast cancer of the left breast.  3. She started chemotherapy consisting of TCH on 07/14/12 with 6 cycles planned.    CURRENT THERAPY:  [facility] [id number] with weekly herceptin  INTERVAL HISTORY: [name] 67 y.o. female returns for follow up today of her left breast invasive ductal carcinoma.  She completed cycle 4 of TCH two weeks ago and last week she was very nauseated.  She required IV fluids and IV anti-emetics which helped.   She is feeling better since then, and they helped markedly.  She is drinking 2 bottles of water, tea, ensure, and boost.  She would like IV fluids again today.  She does have watery eyes, and urge incontinence that is ongoing.  She has taken sanctura which at first helped, but doesn't anymore.  She has also tried [employer] and scheduled bathroom breaks which help minimally. She denies fevers, chills, nausea, vomiting, constipation, diarrhea, skin changes, nail changes, numbness, or any further concerns.    MEDICAL HISTORY: Past Medical History  Diagnosis Date  .  Hyperlipidemia   . Invasive ductal carcinoma of breast 2013    Left  . Arthritis   . Depression   . Heart murmur     heard one when she was pregnamt-maybe had echo-cannot remember  . Hypertension   . GERD (gastroesophageal reflux disease)     ALLERGIES:  has No Known Allergies.  MEDICATIONS:  Current Outpatient Prescriptions  Medication Sig Dispense Refill  . Ascorbic Acid (VITAMIN C) 1000 MG tablet Take 1,000 mg by mouth daily.        Marland Kitchen aspirin 81 MG tablet Take 81 mg by mouth daily.      Marland Kitchen atenolol (TENORMIN) 50 MG tablet Take 50 mg by mouth daily.      . Cholecalciferol (VITAMIN D) 2000 UNITS tablet Take 2,000 Units by mouth daily.      . citalopram (CELEXA) 10 MG tablet Take 10 mg by mouth daily.       Marland Kitchen dexamethasone (DECADRON) 4 MG tablet Take 2 tablets (8 mg total) by mouth 2 (two) times daily with a meal. Take two times a day the day before Taxotere. Then take two times a day starting the day after chemo for 3 days.  30 tablet  1  . lidocaine-prilocaine (EMLA) cream Apply 1 application topically as needed (used for chemo).      . LORazepam (ATIVAN) 0.5 MG tablet Take 1 tablet (0.5 mg total) by mouth every 6 (six) hours as needed (Nausea or vomiting).  30 tablet  0  . olmesartan-hydrochlorothiazide (BENICAR HCT) 40-12.5 MG per tablet Take 1 tablet by mouth daily.      Marland Kitchen omeprazole (PRILOSEC) 40 MG capsule Take 40 mg by mouth daily.      . ondansetron (ZOFRAN) 8 MG tablet Take 1 tablet (8 mg total) by mouth 2 (two) times daily. Take two times a day starting the day after chemo for 3 days. Then take two times a day as needed for nausea or vomiting.  30 tablet  1  . prochlorperazine (COMPAZINE) 10 MG tablet Take 1 tablet (10 mg total) by mouth every 6 (six) hours as needed (Nausea or vomiting).  30 tablet  1  . simvastatin (ZOCOR) 80 MG tablet Take 40 mg by mouth at bedtime.       . Trospium Chloride 60 MG CP24 Take 60 mg by mouth daily.      Marland Kitchen UNABLE TO FIND Cranial prosthesis due  to chemotherapy induced alopecia.  1 Units  0  . vitamin E 400 UNIT capsule Take 400 Units by mouth daily.        . prochlorperazine (COMPAZINE) 25 MG suppository Place 1 suppository (25 mg total) rectally every 12 (twelve) hours as needed for nausea.  12 suppository  3   No current facility-administered medications for this visit.    SURGICAL HISTORY:  Past Surgical History  Procedure Laterality Date  . Tubal ligation  1988  . Bladder suspension  10/2010  . Tonsillectomy and adenoidectomy      age 67  . Abdominal adhesion surgery  1980  . Benign breast biopsy  1994    Left  . Breast lumpectomy with needle localization and axillary sentinel lymph node bx  06/10/2012    Procedure: BREAST LUMPECTOMY WITH NEEDLE LOCALIZATION AND AXILLARY SENTINEL LYMPH NODE BX;  Surgeon: Emelia Loron, MD;  Location: [facility];  Service: General;  Laterality: Left;  . Appendectomy    . Cholecystectomy    . Portacath placement  07/07/2012    Procedure: INSERTION PORT-A-CATH;  Surgeon: Emelia Loron, MD;  Location: [facility];  Service: General;  Laterality: Left;  Port a cath insertion    REVIEW OF SYSTEMS:    General: fatigue (+), night sweats (-), fever (-), pain (-) Lymph: palpable nodes (-) HEENT: vision changes (-), mucositis (-), gum bleeding (-), epistaxis (-) Cardiovascular: chest pain (-), palpitations (-) Pulmonary: shortness of breath (-), dyspnea on exertion (-), cough (-), hemoptysis (-) GI:  Early satiety (-), melena (-), dysphagia (-), nausea/vomiting (-), diarrhea (-) GU: dysuria (-), hematuria (-), incontinence (-) Musculoskeletal: joint swelling (-), joint pain (-), back pain (-) Neuro: weakness (-), numbness (-), headache (-), confusion (-) Skin: Rash (-), lesions (-), dryness (-) Psych: depression (-), suicidal/homicidal ideation (-), feeling of hopelessness (-)   PHYSICAL EXAMINATION: Blood pressure 101/58, pulse 86, temperature 98.4 F (36.9  C), temperature source Oral, resp. rate 20, height 5\' 2"  (1.575 m), weight 163 lb 4.8 oz (74.072 kg). Body mass index is 29.86 kg/(m^2). General: Patient is a well appearing female in no acute distress HEENT: PERRLA, sclerae anicteric no conjunctival pallor, MMM Neck: supple, no palpable adenopathy Lungs: clear to auscultation bilaterally, no wheezes, rhonchi, or rales Cardiovascular: regular rate rhythm, S1, S2,  Systolic murmur, no rubs or gallops Abdomen: Soft, non-tender, non-distended, normoactive bowel sounds, no HSM Extremities: warm and well perfused, no clubbing, cyanosis, or edema Skin: No rashes or lesions Neuro: Non-focal Breasts:  Left breast incision site well healed, no  nodularity, right breast no masses or nodules.  ECOG PERFORMANCE STATUS: 1 - Symptomatic but completely ambulatory      LABORATORY DATA: Lab Results  Component Value Date   WBC 11.7* 09/29/2012   HGB 8.9* 09/29/2012   HCT 27.5* 09/29/2012   MCV 96.2 09/29/2012   PLT 112* 09/29/2012      Chemistry      Component Value Date/Time   NA 140 09/22/2012 0812   NA 137 08/09/2012 1935   K 4.4 09/22/2012 0812   K 4.2 08/09/2012 1935   CL 106 09/22/2012 0812   CL 103 08/09/2012 1935   CO2 25 09/22/2012 0812   CO2 24 08/09/2012 1935   BUN 18.8 09/22/2012 0812   BUN 48* 08/09/2012 1935   CREATININE 0.9 09/22/2012 0812   CREATININE 1.13* 08/09/2012 1935      Component Value Date/Time   CALCIUM 9.4 09/22/2012 0812   CALCIUM 9.3 08/09/2012 1935   ALKPHOS 94 09/22/2012 0812   ALKPHOS 65 06/08/2012 0924   AST 15 09/22/2012 0812   AST 22 06/08/2012 0924   ALT 17 09/22/2012 0812   ALT 21 06/08/2012 0924   BILITOT 0.46 09/22/2012 0812   BILITOT 0.5 06/08/2012 0924     FINAL DIAGNOSIS Diagnosis 1. Breast, lumpectomy, Left - INVASIVE DUCTAL CARCINOMA (1.2CM) SEE COMMENT - LYMPHOVASCULAR INVASION IDENTIFIED. - INVASIVE TUMOR IS LESS THAN 0.1 MM FROM NEAREST MARGIN (ANTERIOR). - LYMPHOVASCULAR INVASION IDENTIFIED. - DUCTAL  CARCINOMA IN SITU WITH CALCIFICATIONS AND COMEDO NECROSIS. - IN SITU CARCINOMA IS PRESENT AT ANTERIOR MARGIN. - SEE TUMOR SYNOPTIC TEMPLATE BELOW. 2. Lymph node, sentinel, biopsy, Left - ONE LYMPH NODE, POSITIVE FOR METASTATIC MAMMARY CARCINOMA (1/1) - TUMOR DEPOSIT IS 1.1CM - NO EXTRACAPSULAR TUMOR EXTENSION PRESENT. Microscopic Comment 1. BREAST, INVASIVE TUMOR, WITH LYMPH NODE SAMPLING Specimen, including laterality: Left breast. Procedure: Lumpectomy. Grade: II of III Tubule formation: 3 Nuclear pleomorphism: 2 Mitotic:1 Tumor size (gross measurement): 1.2 cm Margins: Invasive, distance to closest margin: Less than 0.1 mm, see comment. In-situ, distance to closest margin: Present at anterior margin. If margin positive, focally or broadly: Focally Lymphovascular invasion: Present. Ductal carcinoma in situ: Present Grade: III of III Extensive intraductal component: Absent. 1 of 3 FINAL for [name], [name] ([id number]) Microscopic Comment(continued) Lobular neoplasia: Absent. Tumor focality: Unifocal. Treatment effect: None. If present, treatment effect in breast tissue, lymph nodes or both: N/A Extent of tumor: Skin: Negative for tumor. Nipple: N/A Skeletal muscle: N/A Lymph nodes: # examined: 1 Lymph nodes with metastasis: 1 Macrometastasis: (> 2.0 mm): 1.1 cm Extracapsular extension: Absent. Breast prognostic profile: Estrogen receptor: Not repeated, previous study demonstrated 100% positivity ([id number]) Progesterone receptor: Not repeated, previous study demonstrated 100% positivity ([id number]) Her 2 neu: Repeated, previous study demonstrated amplification at (2.45) ([id number]). Ki-67: Not repeated, previous study demonstrated 55% proliferation rate. Non-neoplastic breast: Previous biopsy site, vascular calcifications, benign fibrocytic change, and microcalcifications in benign ducts and lobules. TNM: pT1c, pN1a, pMX Comments: Although invasive tumor  extends into cauterized tissue, there is no invasive tumor definitively identified at the cauterized edge (slides 1A-1C). (BJS:gt, 06/14/12) [place] RUND DO Pathologist, Electronic Signature (Case signed 06/14/2012) Specimen Gross and Clinical Information Specimen(s) Obtained: 1. Breast, lumpectomy, Left 2. Lymph node, sentinel, biopsy, Left Specimen Clinical Information  RADIOGRAPHIC STUDIES:   ASSESSMENT:  67 year old female with  #1 new diagnosis of 1.6 cm (by MRI) invasive ductal carcinoma grade 2 ER +100% PR +100% HER-2/neu amplified with a ratio of 2.45 with an elevated Ki-67 of 55%. Patient  is status post biopsy on 05/14/2012. Patient found a mass on self breast examination. Patient was seen by Dr. Harden Mo and he feels that the patient is a breast conservation candidate with lumpectomy and sentinel lymph node biopsy. She is now being seen for discussion of adjuvant treatment post lumpectomy.   #2 patient and I discussed her pathology in detail. We discussed the significance of grade as well as elevated Ki-67 and HER-2/neu positivity. We discussed use of HER-2 based treatment such as with Herceptin and chemotherapy. We discussed combination Herceptin and chemotherapy consisting of Taxotere carboplatinum and Herceptin. The Taxotere and carboplatinum would be given every 3 weeks for a total of 6 cycles. Herceptin would be given initially weekly with her chemotherapy and then changed to every 3 weeks to finish out one year of HER-2 therapy.   #3 patient certainly will need radiation therapy and she will be seen by Dr. Doristine Devoid.   #4 because patient's tumor is estrogen receptor positive she would also received adjuvant antiestrogen therapy in the form of an aromatase inhibitors and she is postmenopausal. We discussed the different aromatase inhibitors.  #5 dehydration  #6 urge incontinence  PLAN:  #1 Ms. Cooperwood had a rough time with this cycle of chemotherapy.  Her labs look  good, and she will proceed with her weekly Herceptin today. For the watery eyes I recommended either Allegra or Claritin. She will have a diagnostic mammogram when due in May/June.    #2 She continues to have decreased fluid intake, therefore we will give her IV fluids today.    #3 She will return in one weeks time for an appointment, and her fifth cycle of TCH.      #4 once she finishes 6 cycles of TCH we will plan on referring her to radiation oncology. Patient is very much interested in having a few weeks off and I think that is okay after she completes her chemotherapy well she is waiting to get started on radiation.  She will continue Herceptin every 3 weeks starting June 26.  #5 I offered to refer her to urology for the urge incontinence.  She would like to f/u with her PCP on May 9 and this is already scheduled.    All questions were answered. The patient knows to call the clinic with any problems, questions or concerns. We can certainly see the patient much sooner if necessary.  I spent 25 minutes counseling the patient face to face. The total time spent in the appointment was 30 minutes.  Cherie Ouch Lyn Hollingshead, NP Medical Oncology [facility] Phone: [phone] 09/29/2012, 9:01 AM

## 2012-09-29 NOTE — Patient Instructions (Addendum)
[facility] Discharge Instructions for Patients Receiving Chemotherapy  Today you received the following chemotherapy agents Herceptin.  To help prevent nausea and vomiting after your treatment, we encourage you to take your nausea medication as prescribed.   If you develop nausea and vomiting that is not controlled by your nausea medication, call the clinic. If it is after clinic hours your family physician or the after hours number for the clinic or go to the Emergency Department.   BELOW ARE SYMPTOMS THAT SHOULD BE REPORTED IMMEDIATELY:  *FEVER GREATER THAN 100.5 F  *CHILLS WITH OR WITHOUT FEVER  NAUSEA AND VOMITING THAT IS NOT CONTROLLED WITH YOUR NAUSEA MEDICATION  *UNUSUAL SHORTNESS OF BREATH  *UNUSUAL BRUISING OR BLEEDING  TENDERNESS IN MOUTH AND THROAT WITH OR WITHOUT PRESENCE OF ULCERS  *URINARY PROBLEMS  *BOWEL PROBLEMS  UNUSUAL RASH Items with * indicate a potential emergency and should be followed up as soon as possible.  Feel free to call the clinic you have any questions or concerns. The clinic phone number is [phone].   I have been informed and understand all the instructions given to me. I know to contact the clinic, my physician, or go to the Emergency Department if any problems should occur. I do not have any questions at this time, but understand that I may call the clinic during office hours   should I have any questions or need assistance in obtaining follow up care.    __________________________________________  _____________  __________ Signature of Patient or Authorized Representative            Date                   Time    __________________________________________ Nurse's Signature  Dehydration, Adult Dehydration is when you lose more fluids from the body than you take in. Vital organs like the kidneys, brain, and heart cannot function without a proper amount of fluids and salt. Any loss of fluids from the body can cause  dehydration.  CAUSES   Vomiting.  Diarrhea.  Excessive sweating.  Excessive urine output.  Fever. SYMPTOMS  Mild dehydration  Thirst.  Dry lips.  Slightly dry mouth. Moderate dehydration  Very dry mouth.  Sunken eyes.  Skin does not bounce back quickly when lightly pinched and released.  Dark urine and decreased urine production.  Decreased tear production.  Headache. Severe dehydration  Very dry mouth.  Extreme thirst.  Rapid, weak pulse (more than 100 beats per minute at rest).  Cold hands and feet.  Not able to sweat in spite of heat and temperature.  Rapid breathing.  Blue lips.  Confusion and lethargy.  Difficulty being awakened.  Minimal urine production.  No tears. DIAGNOSIS  Your caregiver will diagnose dehydration based on your symptoms and your exam. Blood and urine tests will help confirm the diagnosis. The diagnostic evaluation should also identify the cause of dehydration. TREATMENT  Treatment of mild or moderate dehydration can often be done at home by increasing the amount of fluids that you drink. It is best to drink small amounts of fluid more often. Drinking too much at one time can make vomiting worse. Refer to the home care instructions below. Severe dehydration needs to be treated at the hospital where you will probably be given intravenous (IV) fluids that contain water and electrolytes. HOME CARE INSTRUCTIONS   Ask your caregiver about specific rehydration instructions.  Drink enough fluids to keep your urine clear or pale yellow.  Drink small amounts frequently if you have nausea and vomiting.  Eat as you normally do.  Avoid:  Foods or drinks high in sugar.  Carbonated drinks.  Juice.  Extremely hot or cold fluids.  Drinks with caffeine.  Fatty, greasy foods.  Alcohol.  Tobacco.  Overeating.  Gelatin desserts.  Wash your hands well to avoid spreading bacteria and viruses.  Only take over-the-counter  or prescription medicines for pain, discomfort, or fever as directed by your caregiver.  Ask your caregiver if you should continue all prescribed and over-the-counter medicines.  Keep all follow-up appointments with your caregiver. SEEK MEDICAL CARE IF:  You have abdominal pain and it increases or stays in one area (localizes).  You have a rash, stiff neck, or severe headache.  You are irritable, sleepy, or difficult to awaken.  You are weak, dizzy, or extremely thirsty. SEEK IMMEDIATE MEDICAL CARE IF:   You are unable to keep fluids down or you get worse despite treatment.  You have frequent episodes of vomiting or diarrhea.  You have blood or green matter (bile) in your vomit.  You have blood in your stool or your stool looks black and tarry.  You have not urinated in 6 to 8 hours, or you have only urinated a small amount of very dark urine.  You have a fever.  You faint. MAKE SURE YOU:   Understand these instructions.  Will watch your condition.  Will get help right away if you are not doing well or get worse. Document Released: 05/19/2005 Document Revised: 08/11/2011 Document Reviewed: 01/06/2011 [facility] Patient Information 2013 [place], [place].

## 2012-09-29 NOTE — Progress Notes (Signed)
Patient came by and had a letter from her insurance saying they need information but she didn't know what was needed?? I told her to call them and asked what is needed. I also gave her the billing ph if that is what they needed so she can get a copy of it.

## 2012-09-29 NOTE — Patient Instructions (Addendum)
Doing well.  Proceed with Herceptin.  We will give you IV fluids today.  Please call us if you have any questions or concerns.

## 2012-09-30 ENCOUNTER — Other Ambulatory Visit: Payer: Self-pay | Admitting: Internal Medicine

## 2012-09-30 DIAGNOSIS — Z853 Personal history of malignant neoplasm of breast: Secondary | ICD-10-CM

## 2012-10-04 ENCOUNTER — Ambulatory Visit (HOSPITAL_BASED_OUTPATIENT_CLINIC_OR_DEPARTMENT_OTHER)
Admission: RE | Admit: 2012-10-04 | Discharge: 2012-10-04 | Disposition: A | Discharge status: Home / Self Care | Payer: BC Managed Care – PPO | Source: Ambulatory Visit | Attending: Internal Medicine | Admitting: Internal Medicine

## 2012-10-04 ENCOUNTER — Ambulatory Visit (HOSPITAL_COMMUNITY)
Admission: RE | Admit: 2012-10-04 | Discharge: 2012-10-04 | Disposition: A | Discharge status: Home / Self Care | Payer: BC Managed Care – PPO | Source: Ambulatory Visit | Attending: Internal Medicine | Admitting: Internal Medicine

## 2012-10-04 ENCOUNTER — Encounter (HOSPITAL_COMMUNITY): Payer: Self-pay

## 2012-10-04 VITALS — BP 128/70 | HR 79 | Resp 20 | Wt 165.1 lb

## 2012-10-04 DIAGNOSIS — C50919 Malignant neoplasm of unspecified site of unspecified female breast: Secondary | ICD-10-CM | POA: Insufficient documentation

## 2012-10-04 DIAGNOSIS — K219 Gastro-esophageal reflux disease without esophagitis: Secondary | ICD-10-CM | POA: Insufficient documentation

## 2012-10-04 DIAGNOSIS — Z7982 Long term (current) use of aspirin: Secondary | ICD-10-CM | POA: Insufficient documentation

## 2012-10-04 DIAGNOSIS — I10 Essential (primary) hypertension: Secondary | ICD-10-CM | POA: Insufficient documentation

## 2012-10-04 DIAGNOSIS — Z171 Estrogen receptor negative status [ER-]: Secondary | ICD-10-CM | POA: Insufficient documentation

## 2012-10-04 DIAGNOSIS — Z79899 Other long term (current) drug therapy: Secondary | ICD-10-CM | POA: Insufficient documentation

## 2012-10-04 DIAGNOSIS — C50419 Malignant neoplasm of upper-outer quadrant of unspecified female breast: Secondary | ICD-10-CM

## 2012-10-04 DIAGNOSIS — Z09 Encounter for follow-up examination after completed treatment for conditions other than malignant neoplasm: Secondary | ICD-10-CM

## 2012-10-04 DIAGNOSIS — F329 Major depressive disorder, single episode, unspecified: Secondary | ICD-10-CM | POA: Insufficient documentation

## 2012-10-04 DIAGNOSIS — M129 Arthropathy, unspecified: Secondary | ICD-10-CM | POA: Insufficient documentation

## 2012-10-04 DIAGNOSIS — F3289 Other specified depressive episodes: Secondary | ICD-10-CM | POA: Insufficient documentation

## 2012-10-04 NOTE — Assessment & Plan Note (Addendum)
Have reviewed today's echo with patient.  She is clear to continue herceptin therapy a this time.  Will follow up 2 months with echo then extend to every 3 months once herceptin is q3 weeks.  Patient seen and examined with Ulyess Blossom, PA-C. We discussed all aspects of the encounter. I agree with the assessment and plan as stated above. I reviewed echos personally. EF and Doppler parameters stable. No HF on exam. Continue Herceptin.

## 2012-10-04 NOTE — Assessment & Plan Note (Addendum)
Well controlled, continue current therapy.  Attending: Agree.

## 2012-10-04 NOTE — Patient Instructions (Addendum)
Follow up 2 months with echo. 

## 2012-10-04 NOTE — Progress Notes (Signed)
Onocologist: Dr Welton Flakes General Surgeon: Dr Dwain Sarna PCP: Dr Eula Listen  HPI:  [name] is a 67 year old with PMH of depression, HTN and triple-positive breast cancer - invasive ductal carcinoma grade 2 ER +100% PR +100% HER-2/neu +.   Treatment is with combination Herceptin and chemotherapy consisting of Taxotere carboplatinum and Herceptin.  Taxotere and carboplatinum will be given every 3 weeks for a total of 46 cycles. Herceptin would be given initially weekly with her chemotherapy and then changed to every 3 weeks to finish out one year of HER-2 therapy.   ECHO:  07/06/12 EF 60% Lateral S' 8.4 Pre treatment 10/04/12 EF 55-60% lat s' 8.4    She returns for follow up today.  She is doing well.  After her chemotherapy she feels bad for a couple of days but otherwise tolerating well.  She has 2 more cycles of chemo and continues on herceptin weekly.  She denies orthopnea, PND or edema.  No chest pain.  Review of Systems: All pertinent positives and negatives as in HPI, otherwise negative.     Past Medical History  Diagnosis Date  . Hyperlipidemia   . Invasive ductal carcinoma of breast 2013    Left  . Arthritis   . Depression   . Heart murmur     heard one when she was pregnamt-maybe had echo-cannot remember  . Hypertension   . GERD (gastroesophageal reflux disease)     Current Outpatient Prescriptions  Medication Sig Dispense Refill  . Ascorbic Acid (VITAMIN C) 1000 MG tablet Take 1,000 mg by mouth daily.        Marland Kitchen aspirin 81 MG tablet Take 81 mg by mouth daily.      Marland Kitchen atenolol (TENORMIN) 50 MG tablet Take 50 mg by mouth daily.      . Cholecalciferol (VITAMIN D) 2000 UNITS tablet Take 2,000 Units by mouth daily.      . citalopram (CELEXA) 10 MG tablet Take 10 mg by mouth daily.       Marland Kitchen dexamethasone (DECADRON) 4 MG tablet Take 2 tablets (8 mg total) by mouth 2 (two) times daily with a meal. Take two times a day the day before Taxotere. Then take two times a day starting the day after  chemo for 3 days.  30 tablet  1  . lidocaine-prilocaine (EMLA) cream Apply 1 application topically as needed (used for chemo).      . LORazepam (ATIVAN) 0.5 MG tablet Take 1 tablet (0.5 mg total) by mouth every 6 (six) hours as needed (Nausea or vomiting).  30 tablet  0  . olmesartan-hydrochlorothiazide (BENICAR HCT) 40-12.5 MG per tablet Take 1 tablet by mouth daily.      Marland Kitchen omeprazole (PRILOSEC) 40 MG capsule Take 40 mg by mouth daily.      . ondansetron (ZOFRAN) 8 MG tablet Take 1 tablet (8 mg total) by mouth 2 (two) times daily. Take two times a day starting the day after chemo for 3 days. Then take two times a day as needed for nausea or vomiting.  30 tablet  1  . prochlorperazine (COMPAZINE) 10 MG tablet Take 1 tablet (10 mg total) by mouth every 6 (six) hours as needed (Nausea or vomiting).  30 tablet  1  . prochlorperazine (COMPAZINE) 25 MG suppository Place 1 suppository (25 mg total) rectally every 12 (twelve) hours as needed for nausea.  12 suppository  3  . simvastatin (ZOCOR) 80 MG tablet Take 40 mg by mouth at bedtime.       Marland Kitchen  Trospium Chloride 60 MG CP24 Take 60 mg by mouth daily.      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  1 Units  0  . vitamin E 400 UNIT capsule Take 400 Units by mouth daily.         No current facility-administered medications for this encounter.     No Known Allergies   PHYSICAL EXAM: Filed Vitals:   10/04/12 0948  BP: 128/70  Pulse: 79  Resp: 20   General:  Well appearing. No respiratory difficulty HEENT: normal Neck: supple. no JVD. Carotids 2+ bilat; no bruits. No lymphadenopathy or thryomegaly appreciated. Cor: PMI nondisplaced. Regular rate & rhythm. No rubs, gallops 2/6 TR Lungs: clear Abdomen: soft, nontender, nondistended. No hepatosplenomegaly. No bruits or masses. Good bowel sounds. Extremities: no cyanosis, clubbing, rash, edema Neuro: alert & oriented x 3, cranial nerves grossly intact. moves all 4  extremities w/o difficulty. Affect pleasant.    ASSESSMENT & PLAN:

## 2012-10-04 NOTE — Progress Notes (Signed)
*  PRELIMINARY RESULTS* Echocardiogram 2D Echocardiogram has been performed.  Jeryl Columbia 10/04/2012, 9:55 AM

## 2012-10-05 ENCOUNTER — Other Ambulatory Visit: Payer: Self-pay | Admitting: Medical Oncology

## 2012-10-05 DIAGNOSIS — C50412 Malignant neoplasm of upper-outer quadrant of left female breast: Secondary | ICD-10-CM

## 2012-10-06 ENCOUNTER — Ambulatory Visit (HOSPITAL_BASED_OUTPATIENT_CLINIC_OR_DEPARTMENT_OTHER): Payer: BC Managed Care – PPO

## 2012-10-06 ENCOUNTER — Other Ambulatory Visit (HOSPITAL_BASED_OUTPATIENT_CLINIC_OR_DEPARTMENT_OTHER): Payer: BC Managed Care – PPO | Admitting: Lab

## 2012-10-06 ENCOUNTER — Telehealth: Payer: Self-pay | Admitting: Oncology

## 2012-10-06 ENCOUNTER — Ambulatory Visit (HOSPITAL_BASED_OUTPATIENT_CLINIC_OR_DEPARTMENT_OTHER): Payer: BC Managed Care – PPO | Admitting: Adult Health

## 2012-10-06 ENCOUNTER — Encounter: Payer: Self-pay | Admitting: Adult Health

## 2012-10-06 VITALS — BP 115/61 | HR 67 | Temp 97.9°F | Resp 20 | Ht 62.0 in | Wt 163.7 lb

## 2012-10-06 DIAGNOSIS — C50219 Malignant neoplasm of upper-inner quadrant of unspecified female breast: Secondary | ICD-10-CM

## 2012-10-06 DIAGNOSIS — C50412 Malignant neoplasm of upper-outer quadrant of left female breast: Secondary | ICD-10-CM

## 2012-10-06 DIAGNOSIS — Z5112 Encounter for antineoplastic immunotherapy: Secondary | ICD-10-CM

## 2012-10-06 DIAGNOSIS — Z5111 Encounter for antineoplastic chemotherapy: Secondary | ICD-10-CM

## 2012-10-06 DIAGNOSIS — R350 Frequency of micturition: Secondary | ICD-10-CM

## 2012-10-06 DIAGNOSIS — Z170 Estrogen receptor positive status [ER+]: Secondary | ICD-10-CM

## 2012-10-06 DIAGNOSIS — N3941 Urge incontinence: Secondary | ICD-10-CM

## 2012-10-06 LAB — CBC WITH DIFFERENTIAL/PLATELET
BASO%: 0 % (ref 0.0–2.0)
Eosinophils Absolute: 0 10*3/uL (ref 0.0–0.5)
LYMPH%: 11.6 % — ABNORMAL LOW (ref 14.0–49.7)
MCHC: 32.6 g/dL (ref 31.5–36.0)
MONO#: 1 10*3/uL — ABNORMAL HIGH (ref 0.1–0.9)
NEUT#: 10 10*3/uL — ABNORMAL HIGH (ref 1.5–6.5)
RBC: 2.79 10*6/uL — ABNORMAL LOW (ref 3.70–5.45)
RDW: 20.1 % — ABNORMAL HIGH (ref 11.2–14.5)
WBC: 12.5 10*3/uL — ABNORMAL HIGH (ref 3.9–10.3)
lymph#: 1.5 10*3/uL (ref 0.9–3.3)
nRBC: 0 % (ref 0–0)

## 2012-10-06 LAB — COMPREHENSIVE METABOLIC PANEL (CC13)
ALT: 20 U/L (ref 0–55)
AST: 17 U/L (ref 5–34)
Calcium: 10 mg/dL (ref 8.4–10.4)
Chloride: 104 mEq/L (ref 98–107)
Creatinine: 1.1 mg/dL (ref 0.6–1.1)
Potassium: 4 mEq/L (ref 3.5–5.1)
Sodium: 141 mEq/L (ref 136–145)
Total Protein: 7.2 g/dL (ref 6.4–8.3)

## 2012-10-06 MED ORDER — SODIUM CHLORIDE 0.9 % IV SOLN
524.4000 mg | Freq: Once | INTRAVENOUS | Status: AC
  Administered 2012-10-06: 520 mg via INTRAVENOUS
  Filled 2012-10-06: qty 52

## 2012-10-06 MED ORDER — SODIUM CHLORIDE 0.9 % IJ SOLN
10.0000 mL | INTRAMUSCULAR | Status: DC | PRN
  Administered 2012-10-06: 10 mL
  Filled 2012-10-06: qty 10

## 2012-10-06 MED ORDER — SODIUM CHLORIDE 0.9 % IV SOLN
Freq: Once | INTRAVENOUS | Status: AC
  Administered 2012-10-06: 12:00:00 via INTRAVENOUS

## 2012-10-06 MED ORDER — DIPHENHYDRAMINE HCL 25 MG PO CAPS
50.0000 mg | ORAL_CAPSULE | Freq: Once | ORAL | Status: AC
  Administered 2012-10-06: 50 mg via ORAL

## 2012-10-06 MED ORDER — SODIUM CHLORIDE 0.9 % IV SOLN
75.0000 mg/m2 | Freq: Once | INTRAVENOUS | Status: AC
  Administered 2012-10-06: 130 mg via INTRAVENOUS
  Filled 2012-10-06: qty 13

## 2012-10-06 MED ORDER — DEXAMETHASONE SODIUM PHOSPHATE 20 MG/5ML IJ SOLN
20.0000 mg | Freq: Once | INTRAMUSCULAR | Status: AC
  Administered 2012-10-06: 20 mg via INTRAVENOUS

## 2012-10-06 MED ORDER — ONDANSETRON 16 MG/50ML IVPB (CHCC)
16.0000 mg | Freq: Once | INTRAVENOUS | Status: AC
  Administered 2012-10-06: 16 mg via INTRAVENOUS

## 2012-10-06 MED ORDER — HEPARIN SOD (PORK) LOCK FLUSH 100 UNIT/ML IV SOLN
500.0000 [IU] | Freq: Once | INTRAVENOUS | Status: AC | PRN
  Administered 2012-10-06: 500 [IU]
  Filled 2012-10-06: qty 5

## 2012-10-06 MED ORDER — TRASTUZUMAB CHEMO INJECTION 440 MG
2.0000 mg/kg | Freq: Once | INTRAVENOUS | Status: AC
  Administered 2012-10-06: 147 mg via INTRAVENOUS
  Filled 2012-10-06: qty 7

## 2012-10-06 MED ORDER — ACETAMINOPHEN 325 MG PO TABS
650.0000 mg | ORAL_TABLET | Freq: Once | ORAL | Status: AC
  Administered 2012-10-06: 650 mg via ORAL

## 2012-10-06 NOTE — Patient Instructions (Signed)
Doing well.  Proceed with chemotherapy.  I referred you to [facility].  Please call us if you have any questions or concerns.

## 2012-10-06 NOTE — Progress Notes (Signed)
OFFICE PROGRESS NOTE  CCGeorgann Housekeeper, MD [address] [place] [address]  DIAGNOSIS: 67 year old female with invasive ductal carcinoma that was ER +100% PR +100% HER-2/neu positive with a ratio of 2.45. Ki-67 was 55% and elevated tumor was grade 2.  She had a lumpectomy and sentinel node biopsy revealing T1CN1A, stage IIA breast cancer of the left breast.       PRIOR THERAPY: 1. She noted a left breast mass on self throat examination. She underwent a mammogram in December that showed the mass. She went on to have it biopsied on 05/14/2012. The pathology revealed invasive ductal carcinoma that was ER +100% PR +100% HER-2/neu positive with a ratio of 2.45. Ki-67 was 55% and elevated tumor was grade 2. She had an MRI performed that showed the area to be measuring out at 1.6 cm  2. She underwent a lumpectomy with sentinel node biopsy on 06/10/12.  revealing T1CN1A, stage IIA breast cancer of the left breast.  3. She started chemotherapy consisting of TCH on 07/14/12 with 6 cycles planned.    CURRENT THERAPY:  [facility] C5D1 with weekly herceptin  INTERVAL HISTORY: [name] 67 y.o. female returns for follow up today of her left breast invasive ductal carcinoma.  She is here for her first day of TCH cycle 5 and is feeling well.  She continues to have urge incontinence and would like a urology referral.  Otherwise, she denies fevers, chills, nausea, vomiting, constipation, diarrhea, numbness or any further concerns.    MEDICAL HISTORY: Past Medical History  Diagnosis Date  . Hyperlipidemia   . Invasive ductal carcinoma of breast 2013    Left  . Arthritis   . Depression   . Heart murmur     heard one when she was pregnamt-maybe had echo-cannot remember  . Hypertension   . GERD (gastroesophageal reflux disease)     ALLERGIES:  has No Known Allergies.  MEDICATIONS:  Current Outpatient Prescriptions  Medication Sig Dispense Refill  . Ascorbic Acid (VITAMIN  C) 1000 MG tablet Take 1,000 mg by mouth daily.        Marland Kitchen aspirin 81 MG tablet Take 81 mg by mouth daily.      Marland Kitchen atenolol (TENORMIN) 50 MG tablet Take 50 mg by mouth daily.      . Cholecalciferol (VITAMIN D) 2000 UNITS tablet Take 2,000 Units by mouth daily.      . citalopram (CELEXA) 10 MG tablet Take 10 mg by mouth daily.       Marland Kitchen dexamethasone (DECADRON) 4 MG tablet Take 2 tablets (8 mg total) by mouth 2 (two) times daily with a meal. Take two times a day the day before Taxotere. Then take two times a day starting the day after chemo for 3 days.  30 tablet  1  . lidocaine-prilocaine (EMLA) cream Apply 1 application topically as needed (used for chemo).      . LORazepam (ATIVAN) 0.5 MG tablet Take 1 tablet (0.5 mg total) by mouth every 6 (six) hours as needed (Nausea or vomiting).  30 tablet  0  . olmesartan-hydrochlorothiazide (BENICAR HCT) 40-12.5 MG per tablet Take 1 tablet by mouth daily.      Marland Kitchen omeprazole (PRILOSEC) 40 MG capsule Take 40 mg by mouth daily.      . ondansetron (ZOFRAN) 8 MG tablet Take 1 tablet (8 mg total) by mouth 2 (two) times daily. Take two times a day starting the day after chemo for 3 days.  Then take two times a day as needed for nausea or vomiting.  30 tablet  1  . prochlorperazine (COMPAZINE) 10 MG tablet Take 1 tablet (10 mg total) by mouth every 6 (six) hours as needed (Nausea or vomiting).  30 tablet  1  . prochlorperazine (COMPAZINE) 25 MG suppository Place 1 suppository (25 mg total) rectally every 12 (twelve) hours as needed for nausea.  12 suppository  3  . simvastatin (ZOCOR) 80 MG tablet Take 40 mg by mouth at bedtime.       . Trospium Chloride 60 MG CP24 Take 60 mg by mouth daily.      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  1 Units  0  . vitamin E 400 UNIT capsule Take 400 Units by mouth daily.         No current facility-administered medications for this visit.    SURGICAL HISTORY:  Past Surgical History  Procedure  Laterality Date  . Tubal ligation  1988  . Bladder suspension  10/2010  . Tonsillectomy and adenoidectomy      age 57  . Abdominal adhesion surgery  1980  . Benign breast biopsy  1994    Left  . Breast lumpectomy with needle localization and axillary sentinel lymph node bx  06/10/2012    Procedure: BREAST LUMPECTOMY WITH NEEDLE LOCALIZATION AND AXILLARY SENTINEL LYMPH NODE BX;  Surgeon: Emelia Loron, MD;  Location: [facility];  Service: General;  Laterality: Left;  . Appendectomy    . Cholecystectomy    . Portacath placement  07/07/2012    Procedure: INSERTION PORT-A-CATH;  Surgeon: Emelia Loron, MD;  Location: [facility];  Service: General;  Laterality: Left;  Port a cath insertion    REVIEW OF SYSTEMS:    General: fatigue (+), night sweats (-), fever (-), pain (-) Lymph: palpable nodes (-) HEENT: vision changes (-), mucositis (-), gum bleeding (-), epistaxis (-) Cardiovascular: chest pain (-), palpitations (-) Pulmonary: shortness of breath (-), dyspnea on exertion (-), cough (-), hemoptysis (-) GI:  Early satiety (-), melena (-), dysphagia (-), nausea/vomiting (-), diarrhea (-) GU: dysuria (-), hematuria (-), incontinence (-) Musculoskeletal: joint swelling (-), joint pain (-), back pain (-) Neuro: weakness (-), numbness (-), headache (-), confusion (-) Skin: Rash (-), lesions (-), dryness (-) Psych: depression (-), suicidal/homicidal ideation (-), feeling of hopelessness (-)   PHYSICAL EXAMINATION: Blood pressure 115/61, pulse 67, temperature 97.9 F (36.6 C), temperature source Oral, resp. rate 20, height 5\' 2"  (1.575 m), weight 163 lb 11.2 oz (74.254 kg). Body mass index is 29.93 kg/(m^2). General: Patient is a well appearing female in no acute distress HEENT: PERRLA, sclerae anicteric no conjunctival pallor, MMM Neck: supple, no palpable adenopathy Lungs: clear to auscultation bilaterally, no wheezes, rhonchi, or rales Cardiovascular:  regular rate rhythm, S1, S2,  Systolic murmur, no rubs or gallops Abdomen: Soft, non-tender, non-distended, normoactive bowel sounds, no HSM Extremities: warm and well perfused, no clubbing, cyanosis, or edema Skin: No rashes or lesions Neuro: Non-focal Breasts:  Left breast incision site well healed, no nodularity, right breast no masses or nodules.  ECOG PERFORMANCE STATUS: 1 - Symptomatic but completely ambulatory      LABORATORY DATA: Lab Results  Component Value Date   WBC 12.5* 10/06/2012   HGB 8.8* 10/06/2012   HCT 27.0* 10/06/2012   MCV 96.8 10/06/2012   PLT 135* 10/06/2012      Chemistry      Component Value Date/Time  NA 141 09/29/2012 0816   NA 137 08/09/2012 1935   K 4.3 09/29/2012 0816   K 4.2 08/09/2012 1935   CL 107 09/29/2012 0816   CL 103 08/09/2012 1935   CO2 26 09/29/2012 0816   CO2 24 08/09/2012 1935   BUN 27.6* 09/29/2012 0816   BUN 48* 08/09/2012 1935   CREATININE 1.0 09/29/2012 0816   CREATININE 1.13* 08/09/2012 1935      Component Value Date/Time   CALCIUM 9.3 09/29/2012 0816   CALCIUM 9.3 08/09/2012 1935   ALKPHOS 105 09/29/2012 0816   ALKPHOS 65 06/08/2012 0924   AST 17 09/29/2012 0816   AST 22 06/08/2012 0924   ALT 22 09/29/2012 0816   ALT 21 06/08/2012 0924   BILITOT 0.49 09/29/2012 0816   BILITOT 0.5 06/08/2012 0924     FINAL DIAGNOSIS Diagnosis 1. Breast, lumpectomy, Left - INVASIVE DUCTAL CARCINOMA (1.2CM) SEE COMMENT - LYMPHOVASCULAR INVASION IDENTIFIED. - INVASIVE TUMOR IS LESS THAN 0.1 MM FROM NEAREST MARGIN (ANTERIOR). - LYMPHOVASCULAR INVASION IDENTIFIED. - DUCTAL CARCINOMA IN SITU WITH CALCIFICATIONS AND COMEDO NECROSIS. - IN SITU CARCINOMA IS PRESENT AT ANTERIOR MARGIN. - SEE TUMOR SYNOPTIC TEMPLATE BELOW. 2. Lymph node, sentinel, biopsy, Left - ONE LYMPH NODE, POSITIVE FOR METASTATIC MAMMARY CARCINOMA (1/1) - TUMOR DEPOSIT IS 1.1CM - NO EXTRACAPSULAR TUMOR EXTENSION PRESENT. Microscopic Comment 1. BREAST, INVASIVE TUMOR, WITH LYMPH NODE  SAMPLING Specimen, including laterality: Left breast. Procedure: Lumpectomy. Grade: II of III Tubule formation: 3 Nuclear pleomorphism: 2 Mitotic:1 Tumor size (gross measurement): 1.2 cm Margins: Invasive, distance to closest margin: Less than 0.1 mm, see comment. In-situ, distance to closest margin: Present at anterior margin. If margin positive, focally or broadly: Focally Lymphovascular invasion: Present. Ductal carcinoma in situ: Present Grade: III of III Extensive intraductal component: Absent. 1 of 3 FINAL for TALAH, COOKSTON ([id number]) Microscopic Comment(continued) Lobular neoplasia: Absent. Tumor focality: Unifocal. Treatment effect: None. If present, treatment effect in breast tissue, lymph nodes or both: N/A Extent of tumor: Skin: Negative for tumor. Nipple: N/A Skeletal muscle: N/A Lymph nodes: # examined: 1 Lymph nodes with metastasis: 1 Macrometastasis: (> 2.0 mm): 1.1 cm Extracapsular extension: Absent. Breast prognostic profile: Estrogen receptor: Not repeated, previous study demonstrated 100% positivity ([id number]) Progesterone receptor: Not repeated, previous study demonstrated 100% positivity ([id number]) Her 2 neu: Repeated, previous study demonstrated amplification at (2.45) ([id number]). Ki-67: Not repeated, previous study demonstrated 55% proliferation rate. Non-neoplastic breast: Previous biopsy site, vascular calcifications, benign fibrocytic change, and microcalcifications in benign ducts and lobules. TNM: pT1c, pN1a, pMX Comments: Although invasive tumor extends into cauterized tissue, there is no invasive tumor definitively identified at the cauterized edge (slides 1A-1C). (BJS:gt, 06/14/12) [place] RUND DO Pathologist, Electronic Signature (Case signed 06/14/2012) Specimen Gross and Clinical Information Specimen(s) Obtained: 1. Breast, lumpectomy, Left 2. Lymph node, sentinel, biopsy, Left Specimen Clinical Information  RADIOGRAPHIC  STUDIES:   ASSESSMENT:  67 year old female with  #1 new diagnosis of 1.6 cm (by MRI) invasive ductal carcinoma grade 2 ER +100% PR +100% HER-2/neu amplified with a ratio of 2.45 with an elevated Ki-67 of 55%. Patient is status post biopsy on 05/14/2012. Patient found a mass on self breast examination. Patient was seen by Dr. Harden Mo and he feels that the patient is a breast conservation candidate with lumpectomy and sentinel lymph node biopsy. She is now being seen for discussion of adjuvant treatment post lumpectomy.   #2 patient and I discussed her pathology in detail. We discussed the significance of grade as well as  elevated Ki-67 and HER-2/neu positivity. We discussed use of HER-2 based treatment such as with Herceptin and chemotherapy. We discussed combination Herceptin and chemotherapy consisting of Taxotere carboplatinum and Herceptin. The Taxotere and carboplatinum would be given every 3 weeks for a total of 6 cycles. Herceptin would be given initially weekly with her chemotherapy and then changed to every 3 weeks to finish out one year of HER-2 therapy.   #3 patient certainly will need radiation therapy and she will be seen by Dr. Doristine Devoid.   #4 because patient's tumor is estrogen receptor positive she would also received adjuvant antiestrogen therapy in the form of an aromatase inhibitors and she is postmenopausal. We discussed the different aromatase inhibitors.  #5 urge incontinence  PLAN:  #1 Ms. Hild is doing well today.  She will proceed with chemotherapy today.    #2 We will see her back next week for weekly herceptin.   #4 once she finishes 6 cycles of TCH we will plan on referring her to radiation oncology. Patient is very much interested in having a few weeks off and I think that is okay after she completes her chemotherapy well she is waiting to get started on radiation.  She will continue Herceptin every 3 weeks starting June 26.  #5 She agreed to see urology  regarding the incontinence.     All questions were answered. The patient knows to call the clinic with any problems, questions or concerns. We can certainly see the patient much sooner if necessary.  I spent 25 minutes counseling the patient face to face. The total time spent in the appointment was 30 minutes.  Cherie Ouch Lyn Hollingshead, NP Medical Oncology [facility] Phone: [phone] 10/06/2012, 11:27 AM

## 2012-10-06 NOTE — Patient Instructions (Addendum)
[facility] Discharge Instructions for Patients Receiving Chemotherapy  Today you received the following chemotherapy agents Taxotere/Carboplatin/Herceptin.  To help prevent nausea and vomiting after your treatment, we encourage you to take your nausea medication as prescribed.   If you develop nausea and vomiting that is not controlled by your nausea medication, call the clinic. If it is after clinic hours your family physician or the after hours number for the clinic or go to the Emergency Department.   BELOW ARE SYMPTOMS THAT SHOULD BE REPORTED IMMEDIATELY:  *FEVER GREATER THAN 100.5 F  *CHILLS WITH OR WITHOUT FEVER  NAUSEA AND VOMITING THAT IS NOT CONTROLLED WITH YOUR NAUSEA MEDICATION  *UNUSUAL SHORTNESS OF BREATH  *UNUSUAL BRUISING OR BLEEDING  TENDERNESS IN MOUTH AND THROAT WITH OR WITHOUT PRESENCE OF ULCERS  *URINARY PROBLEMS  *BOWEL PROBLEMS  UNUSUAL RASH Items with * indicate a potential emergency and should be followed up as soon as possible.  Feel free to call the clinic you have any questions or concerns. The clinic phone number is [phone].   I have been informed and understand all the instructions given to me. I know to contact the clinic, my physician, or go to the Emergency Department if any problems should occur. I do not have any questions at this time, but understand that I may call the clinic during office hours   should I have any questions or need assistance in obtaining follow up care.    __________________________________________  _____________  __________ Signature of Patient or Authorized Representative            Date                   Time    __________________________________________ Nurse's Signature    

## 2012-10-07 ENCOUNTER — Encounter: Payer: Self-pay | Admitting: Oncology

## 2012-10-07 ENCOUNTER — Ambulatory Visit (HOSPITAL_BASED_OUTPATIENT_CLINIC_OR_DEPARTMENT_OTHER): Payer: BC Managed Care – PPO

## 2012-10-07 VITALS — BP 128/56 | HR 64 | Temp 98.2°F

## 2012-10-07 DIAGNOSIS — C50219 Malignant neoplasm of upper-inner quadrant of unspecified female breast: Secondary | ICD-10-CM

## 2012-10-07 DIAGNOSIS — Z5189 Encounter for other specified aftercare: Secondary | ICD-10-CM

## 2012-10-07 DIAGNOSIS — C50412 Malignant neoplasm of upper-outer quadrant of left female breast: Secondary | ICD-10-CM

## 2012-10-07 MED ORDER — PEGFILGRASTIM INJECTION 6 MG/0.6ML
6.0000 mg | Freq: Once | SUBCUTANEOUS | Status: AC
  Administered 2012-10-07: 6 mg via SUBCUTANEOUS
  Filled 2012-10-07: qty 0.6

## 2012-10-07 NOTE — Progress Notes (Signed)
Enrolled pt in the [employer] First [employer] program.  I faxed signed form and activated card today.  °

## 2012-10-07 NOTE — Progress Notes (Signed)
Patient came in and I explained [employer] 1st step. She signed form. I gave to Lenise.

## 2012-10-12 ENCOUNTER — Other Ambulatory Visit: Payer: Self-pay | Admitting: Adult Health

## 2012-10-12 DIAGNOSIS — C50419 Malignant neoplasm of upper-outer quadrant of unspecified female breast: Secondary | ICD-10-CM

## 2012-10-13 ENCOUNTER — Ambulatory Visit (HOSPITAL_BASED_OUTPATIENT_CLINIC_OR_DEPARTMENT_OTHER): Payer: BC Managed Care – PPO

## 2012-10-13 ENCOUNTER — Encounter: Payer: Self-pay | Admitting: Oncology

## 2012-10-13 ENCOUNTER — Other Ambulatory Visit (HOSPITAL_BASED_OUTPATIENT_CLINIC_OR_DEPARTMENT_OTHER): Payer: BC Managed Care – PPO | Admitting: Lab

## 2012-10-13 ENCOUNTER — Ambulatory Visit (HOSPITAL_BASED_OUTPATIENT_CLINIC_OR_DEPARTMENT_OTHER): Payer: BC Managed Care – PPO | Admitting: Adult Health

## 2012-10-13 ENCOUNTER — Encounter: Payer: Self-pay | Admitting: Adult Health

## 2012-10-13 VITALS — BP 102/64 | HR 81 | Temp 98.2°F | Resp 20 | Ht 62.0 in | Wt 162.7 lb

## 2012-10-13 DIAGNOSIS — C50419 Malignant neoplasm of upper-outer quadrant of unspecified female breast: Secondary | ICD-10-CM

## 2012-10-13 DIAGNOSIS — Z170 Estrogen receptor positive status [ER+]: Secondary | ICD-10-CM

## 2012-10-13 DIAGNOSIS — Z5112 Encounter for antineoplastic immunotherapy: Secondary | ICD-10-CM

## 2012-10-13 DIAGNOSIS — C50412 Malignant neoplasm of upper-outer quadrant of left female breast: Secondary | ICD-10-CM

## 2012-10-13 DIAGNOSIS — C50219 Malignant neoplasm of upper-inner quadrant of unspecified female breast: Secondary | ICD-10-CM

## 2012-10-13 DIAGNOSIS — R350 Frequency of micturition: Secondary | ICD-10-CM

## 2012-10-13 LAB — COMPREHENSIVE METABOLIC PANEL (CC13)
ALT: 20 U/L (ref 0–55)
AST: 16 U/L (ref 5–34)
BUN: 27.2 mg/dL — ABNORMAL HIGH (ref 7.0–26.0)
CO2: 21 mEq/L — ABNORMAL LOW (ref 22–29)
Calcium: 8.6 mg/dL (ref 8.4–10.4)
Chloride: 109 mEq/L — ABNORMAL HIGH (ref 98–107)
Creatinine: 1 mg/dL (ref 0.6–1.1)
Total Bilirubin: 0.56 mg/dL (ref 0.20–1.20)

## 2012-10-13 LAB — CBC WITH DIFFERENTIAL/PLATELET
BASO%: 0.6 % (ref 0.0–2.0)
HCT: 25.7 % — ABNORMAL LOW (ref 34.8–46.6)
LYMPH%: 48.7 % (ref 14.0–49.7)
MCHC: 32.3 g/dL (ref 31.5–36.0)
MCV: 98.5 fL (ref 79.5–101.0)
MONO#: 0.5 10*3/uL (ref 0.1–0.9)
MONO%: 14.8 % — ABNORMAL HIGH (ref 0.0–14.0)
NEUT%: 34.5 % — ABNORMAL LOW (ref 38.4–76.8)
Platelets: 137 10*3/uL — ABNORMAL LOW (ref 145–400)
RBC: 2.61 10*6/uL — ABNORMAL LOW (ref 3.70–5.45)
nRBC: 0 % (ref 0–0)

## 2012-10-13 MED ORDER — SODIUM CHLORIDE 0.9 % IV SOLN
Freq: Once | INTRAVENOUS | Status: AC
  Administered 2012-10-13: 12:00:00 via INTRAVENOUS

## 2012-10-13 MED ORDER — TRASTUZUMAB CHEMO INJECTION 440 MG
2.0000 mg/kg | Freq: Once | INTRAVENOUS | Status: AC
  Administered 2012-10-13: 147 mg via INTRAVENOUS
  Filled 2012-10-13: qty 7

## 2012-10-13 MED ORDER — ACETAMINOPHEN 325 MG PO TABS
650.0000 mg | ORAL_TABLET | Freq: Once | ORAL | Status: AC
  Administered 2012-10-13: 650 mg via ORAL

## 2012-10-13 MED ORDER — DIPHENHYDRAMINE HCL 25 MG PO CAPS
50.0000 mg | ORAL_CAPSULE | Freq: Once | ORAL | Status: AC
  Administered 2012-10-13: 50 mg via ORAL

## 2012-10-13 MED ORDER — SODIUM CHLORIDE 0.9 % IJ SOLN
10.0000 mL | INTRAMUSCULAR | Status: DC | PRN
  Administered 2012-10-13: 10 mL
  Filled 2012-10-13: qty 10

## 2012-10-13 MED ORDER — HEPARIN SOD (PORK) LOCK FLUSH 100 UNIT/ML IV SOLN
500.0000 [IU] | Freq: Once | INTRAVENOUS | Status: AC | PRN
  Administered 2012-10-13: 500 [IU]
  Filled 2012-10-13: qty 5

## 2012-10-13 NOTE — Progress Notes (Signed)
OFFICE PROGRESS NOTE  CCGeorgann Housekeeper, MD [address] [place] [address]  DIAGNOSIS: 67 year old female with invasive ductal carcinoma that was ER +100% PR +100% HER-2/neu positive with a ratio of 2.45. Ki-67 was 55% and elevated tumor was grade 2.  She had a lumpectomy and sentinel node biopsy revealing T1CN1A, stage IIA breast cancer of the left breast.       PRIOR THERAPY: 1. She noted a left breast mass on self throat examination. She underwent a mammogram in December that showed the mass. She went on to have it biopsied on 05/14/2012. The pathology revealed invasive ductal carcinoma that was ER +100% PR +100% HER-2/neu positive with a ratio of 2.45. Ki-67 was 55% and elevated tumor was grade 2. She had an MRI performed that showed the area to be measuring out at 1.6 cm  2. She underwent a lumpectomy with sentinel node biopsy on 06/10/12.  revealing T1CN1A, stage IIA breast cancer of the left breast.  3. She started chemotherapy consisting of TCH on 07/14/12 with 6 cycles planned.    CURRENT THERAPY:  [facility] [id number] with weekly herceptin  INTERVAL HISTORY: [name] 67 y.o. female returns for follow up today of her left breast invasive ductal carcinoma. She received cycle 5 of chemotherapy last week.  She tolerated the chemotherapy relatively well.  She had some aches over the weekend from the Neulasta, and mild nausea relieved with antiemetics.  She denies fevers, chills, vomiting, constipation, diarrhea, numbness, or any further concerns.  Her PO intake is doing well.    MEDICAL HISTORY: Past Medical History  Diagnosis Date  . Hyperlipidemia   . Invasive ductal carcinoma of breast 2013    Left  . Arthritis   . Depression   . Heart murmur     heard one when she was pregnamt-maybe had echo-cannot remember  . Hypertension   . GERD (gastroesophageal reflux disease)     ALLERGIES:  has No Known Allergies.  MEDICATIONS:  Current Outpatient  Prescriptions  Medication Sig Dispense Refill  . Ascorbic Acid (VITAMIN C) 1000 MG tablet Take 1,000 mg by mouth daily.        Marland Kitchen aspirin 81 MG tablet Take 81 mg by mouth daily.      Marland Kitchen atenolol (TENORMIN) 50 MG tablet Take 50 mg by mouth daily.      . Cholecalciferol (VITAMIN D) 2000 UNITS tablet Take 2,000 Units by mouth daily.      . citalopram (CELEXA) 10 MG tablet Take 10 mg by mouth daily.       Marland Kitchen dexamethasone (DECADRON) 4 MG tablet Take 2 tablets (8 mg total) by mouth 2 (two) times daily with a meal. Take two times a day the day before Taxotere. Then take two times a day starting the day after chemo for 3 days.  30 tablet  1  . lidocaine-prilocaine (EMLA) cream Apply 1 application topically as needed (used for chemo).      . LORazepam (ATIVAN) 0.5 MG tablet Take 1 tablet (0.5 mg total) by mouth every 6 (six) hours as needed (Nausea or vomiting).  30 tablet  0  . olmesartan-hydrochlorothiazide (BENICAR HCT) 40-12.5 MG per tablet Take 1 tablet by mouth daily.      Marland Kitchen omeprazole (PRILOSEC) 40 MG capsule Take 40 mg by mouth daily.      . ondansetron (ZOFRAN) 8 MG tablet Take 1 tablet (8 mg total) by mouth 2 (two) times daily. Take two times a day  starting the day after chemo for 3 days. Then take two times a day as needed for nausea or vomiting.  30 tablet  1  . prochlorperazine (COMPAZINE) 10 MG tablet Take 1 tablet (10 mg total) by mouth every 6 (six) hours as needed (Nausea or vomiting).  30 tablet  1  . prochlorperazine (COMPAZINE) 25 MG suppository Place 1 suppository (25 mg total) rectally every 12 (twelve) hours as needed for nausea.  12 suppository  3  . simvastatin (ZOCOR) 80 MG tablet Take 40 mg by mouth at bedtime.       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  1 Units  0  . vitamin E 400 UNIT capsule Take 400 Units by mouth daily.         No current facility-administered medications for this visit.    SURGICAL HISTORY:  Past Surgical History   Procedure Laterality Date  . Tubal ligation  1988  . Bladder suspension  10/2010  . Tonsillectomy and adenoidectomy      age 42  . Abdominal adhesion surgery  1980  . Benign breast biopsy  1994    Left  . Breast lumpectomy with needle localization and axillary sentinel lymph node bx  06/10/2012    Procedure: BREAST LUMPECTOMY WITH NEEDLE LOCALIZATION AND AXILLARY SENTINEL LYMPH NODE BX;  Surgeon: Emelia Loron, MD;  Location: [facility];  Service: General;  Laterality: Left;  . Appendectomy    . Cholecystectomy    . Portacath placement  07/07/2012    Procedure: INSERTION PORT-A-CATH;  Surgeon: Emelia Loron, MD;  Location: [facility];  Service: General;  Laterality: Left;  Port a cath insertion    REVIEW OF SYSTEMS:    General: fatigue (+), night sweats (-), fever (-), pain (-) Lymph: palpable nodes (-) HEENT: vision changes (-), mucositis (-), gum bleeding (-), epistaxis (-) Cardiovascular: chest pain (-), palpitations (-) Pulmonary: shortness of breath (-), dyspnea on exertion (-), cough (-), hemoptysis (-) GI:  Early satiety (-), melena (-), dysphagia (-), nausea/vomiting (-), diarrhea (-) GU: dysuria (-), hematuria (-), incontinence (-) Musculoskeletal: joint swelling (-), joint pain (-), back pain (-) Neuro: weakness (-), numbness (-), headache (-), confusion (-) Skin: Rash (-), lesions (-), dryness (-) Psych: depression (-), suicidal/homicidal ideation (-), feeling of hopelessness (-)   PHYSICAL EXAMINATION: Blood pressure 102/64, pulse 81, temperature 98.2 F (36.8 C), temperature source Oral, resp. rate 20, height 5\' 2"  (1.575 m), weight 162 lb 11.2 oz (73.8 kg). Body mass index is 29.75 kg/(m^2). General: Patient is a well appearing female in no acute distress HEENT: PERRLA, sclerae anicteric no conjunctival pallor, MMM Neck: supple, no palpable adenopathy Lungs: clear to auscultation bilaterally, no wheezes, rhonchi, or  rales Cardiovascular: regular rate rhythm, S1, S2,  Systolic murmur, no rubs or gallops Abdomen: Soft, non-tender, non-distended, normoactive bowel sounds, no HSM Extremities: warm and well perfused, no clubbing, cyanosis, or edema Skin: No rashes or lesions Neuro: Non-focal Breasts:  Left breast incision site well healed, no nodularity, right breast no masses or nodules.  ECOG PERFORMANCE STATUS: 1 - Symptomatic but completely ambulatory   LABORATORY DATA: Lab Results  Component Value Date   WBC 3.5* 10/13/2012   HGB 8.3* 10/13/2012   HCT 25.7* 10/13/2012   MCV 98.5 10/13/2012   PLT 137* 10/13/2012      Chemistry      Component Value Date/Time   NA 140 10/13/2012 1002   NA 137 08/09/2012 1935  K 3.9 10/13/2012 1002   K 4.2 08/09/2012 1935   CL 109* 10/13/2012 1002   CL 103 08/09/2012 1935   CO2 21* 10/13/2012 1002   CO2 24 08/09/2012 1935   BUN 27.2* 10/13/2012 1002   BUN 48* 08/09/2012 1935   CREATININE 1.0 10/13/2012 1002   CREATININE 1.13* 08/09/2012 1935      Component Value Date/Time   CALCIUM 8.6 10/13/2012 1002   CALCIUM 9.3 08/09/2012 1935   ALKPHOS 88 10/13/2012 1002   ALKPHOS 65 06/08/2012 0924   AST 16 10/13/2012 1002   AST 22 06/08/2012 0924   ALT 20 10/13/2012 1002   ALT 21 06/08/2012 0924   BILITOT 0.56 10/13/2012 1002   BILITOT 0.5 06/08/2012 0924     FINAL DIAGNOSIS Diagnosis 1. Breast, lumpectomy, Left - INVASIVE DUCTAL CARCINOMA (1.2CM) SEE COMMENT - LYMPHOVASCULAR INVASION IDENTIFIED. - INVASIVE TUMOR IS LESS THAN 0.1 MM FROM NEAREST MARGIN (ANTERIOR). - LYMPHOVASCULAR INVASION IDENTIFIED. - DUCTAL CARCINOMA IN SITU WITH CALCIFICATIONS AND COMEDO NECROSIS. - IN SITU CARCINOMA IS PRESENT AT ANTERIOR MARGIN. - SEE TUMOR SYNOPTIC TEMPLATE BELOW. 2. Lymph node, sentinel, biopsy, Left - ONE LYMPH NODE, POSITIVE FOR METASTATIC MAMMARY CARCINOMA (1/1) - TUMOR DEPOSIT IS 1.1CM - NO EXTRACAPSULAR TUMOR EXTENSION PRESENT. Microscopic Comment 1. BREAST, INVASIVE TUMOR, WITH  LYMPH NODE SAMPLING Specimen, including laterality: Left breast. Procedure: Lumpectomy. Grade: II of III Tubule formation: 3 Nuclear pleomorphism: 2 Mitotic:1 Tumor size (gross measurement): 1.2 cm Margins: Invasive, distance to closest margin: Less than 0.1 mm, see comment. In-situ, distance to closest margin: Present at anterior margin. If margin positive, focally or broadly: Focally Lymphovascular invasion: Present. Ductal carcinoma in situ: Present Grade: III of III Extensive intraductal component: Absent. 1 of 3 FINAL for CORTNIE, RINGEL ([id number]) Microscopic Comment(continued) Lobular neoplasia: Absent. Tumor focality: Unifocal. Treatment effect: None. If present, treatment effect in breast tissue, lymph nodes or both: N/A Extent of tumor: Skin: Negative for tumor. Nipple: N/A Skeletal muscle: N/A Lymph nodes: # examined: 1 Lymph nodes with metastasis: 1 Macrometastasis: (> 2.0 mm): 1.1 cm Extracapsular extension: Absent. Breast prognostic profile: Estrogen receptor: Not repeated, previous study demonstrated 100% positivity ([id number]) Progesterone receptor: Not repeated, previous study demonstrated 100% positivity ([id number]) Her 2 neu: Repeated, previous study demonstrated amplification at (2.45) ([id number]). Ki-67: Not repeated, previous study demonstrated 55% proliferation rate. Non-neoplastic breast: Previous biopsy site, vascular calcifications, benign fibrocytic change, and microcalcifications in benign ducts and lobules. TNM: pT1c, pN1a, pMX Comments: Although invasive tumor extends into cauterized tissue, there is no invasive tumor definitively identified at the cauterized edge (slides 1A-1C). (BJS:gt, 06/14/12) [place] RUND DO Pathologist, Electronic Signature (Case signed 06/14/2012) Specimen Gross and Clinical Information Specimen(s) Obtained: 1. Breast, lumpectomy, Left 2. Lymph node, sentinel, biopsy, Left Specimen Clinical  Information  RADIOGRAPHIC STUDIES:   ASSESSMENT:  67 year old female with  #1 new diagnosis of 1.6 cm (by MRI) invasive ductal carcinoma grade 2 ER +100% PR +100% HER-2/neu amplified with a ratio of 2.45 with an elevated Ki-67 of 55%. Patient is status post biopsy on 05/14/2012. Patient found a mass on self breast examination. Patient was seen by Dr. Harden Mo and he feels that the patient is a breast conservation candidate with lumpectomy and sentinel lymph node biopsy. She is now being seen for discussion of adjuvant treatment post lumpectomy.   #2 patient and I discussed her pathology in detail. We discussed the significance of grade as well as elevated Ki-67 and HER-2/neu positivity. We discussed use of HER-2 based treatment  such as with Herceptin and chemotherapy. We discussed combination Herceptin and chemotherapy consisting of Taxotere carboplatinum and Herceptin. The Taxotere and carboplatinum would be given every 3 weeks for a total of 6 cycles. Herceptin would be given initially weekly with her chemotherapy and then changed to every 3 weeks to finish out one year of HER-2 therapy.   #3 patient certainly will need radiation therapy and she will be seen by Dr. Doristine Devoid.   #4 because patient's tumor is estrogen receptor positive she would also received adjuvant antiestrogen therapy in the form of an aromatase inhibitors and she is postmenopausal. We discussed the different aromatase inhibitors.  #5 urge incontinence  PLAN:  #1 Ms. Lahm is doing well today.  She will proceed with Herceptin today.    #2 We will see her back next week for weekly herceptin.   #4 once she finishes 6 cycles of TCH we will plan on referring her to radiation oncology. Patient is very much interested in having a few weeks off and I think that is okay after she completes her chemotherapy well she is waiting to get started on radiation.  She will continue Herceptin every 3 weeks starting June 26.  #5  She agreed to see urology regarding the incontinence. Her appointment with them is June 3.     All questions were answered. The patient knows to call the clinic with any problems, questions or concerns. We can certainly see the patient much sooner if necessary.  I spent 25 minutes counseling the patient face to face. The total time spent in the appointment was 30 minutes.  Cherie Ouch Lyn Hollingshead, NP Medical Oncology [facility] Phone: [phone] 10/13/2012, 7:40 PM

## 2012-10-13 NOTE — Patient Instructions (Addendum)
[facility] Discharge Instructions for Patients Receiving Chemotherapy  Today you received the following chemotherapy agents Herceptin.   If you develop nausea and vomiting that is not controlled by your nausea medication, call the clinic. If it is after clinic hours your family physician or the after hours number for the clinic or go to the Emergency Department.   BELOW ARE SYMPTOMS THAT SHOULD BE REPORTED IMMEDIATELY:  *FEVER GREATER THAN 100.5 F  *CHILLS WITH OR WITHOUT FEVER  NAUSEA AND VOMITING THAT IS NOT CONTROLLED WITH YOUR NAUSEA MEDICATION  *UNUSUAL SHORTNESS OF BREATH  *UNUSUAL BRUISING OR BLEEDING  TENDERNESS IN MOUTH AND THROAT WITH OR WITHOUT PRESENCE OF ULCERS  *URINARY PROBLEMS  *BOWEL PROBLEMS  UNUSUAL RASH Items with * indicate a potential emergency and should be followed up as soon as possible.  One of the nurses will contact you 24 hours after your treatment. Please let the nurse know about any problems that you may have experienced. Feel free to call the clinic you have any questions or concerns. The clinic phone number is [phone].   I have been informed and understand all the instructions given to me. I know to contact the clinic, my physician, or go to the Emergency Department if any problems should occur. I do not have any questions at this time, but understand that I may call the clinic during office hours   should I have any questions or need assistance in obtaining follow up care.    __________________________________________  _____________  __________ Signature of Patient or Authorized Representative            Date                   Time    __________________________________________ Nurse's Signature

## 2012-10-13 NOTE — Patient Instructions (Addendum)
Doing well.  Proceed with Herceptin.  Please call us if you have any questions or concerns.   We will see you back next week.

## 2012-10-20 ENCOUNTER — Ambulatory Visit (HOSPITAL_BASED_OUTPATIENT_CLINIC_OR_DEPARTMENT_OTHER): Payer: BC Managed Care – PPO | Admitting: Adult Health

## 2012-10-20 ENCOUNTER — Telehealth: Payer: Self-pay | Admitting: *Deleted

## 2012-10-20 ENCOUNTER — Other Ambulatory Visit (HOSPITAL_BASED_OUTPATIENT_CLINIC_OR_DEPARTMENT_OTHER): Payer: BC Managed Care – PPO | Admitting: Lab

## 2012-10-20 ENCOUNTER — Encounter: Payer: Self-pay | Admitting: Oncology

## 2012-10-20 ENCOUNTER — Ambulatory Visit (HOSPITAL_BASED_OUTPATIENT_CLINIC_OR_DEPARTMENT_OTHER): Payer: BC Managed Care – PPO

## 2012-10-20 ENCOUNTER — Ambulatory Visit: Payer: BC Managed Care – PPO | Admitting: Lab

## 2012-10-20 ENCOUNTER — Encounter: Payer: Self-pay | Admitting: Adult Health

## 2012-10-20 ENCOUNTER — Encounter (HOSPITAL_COMMUNITY)
Admission: RE | Admit: 2012-10-20 | Discharge: 2012-10-20 | Disposition: A | Discharge status: Home / Self Care | Payer: BC Managed Care – PPO | Source: Ambulatory Visit | Attending: Oncology | Admitting: Oncology

## 2012-10-20 VITALS — BP 111/65 | HR 76 | Temp 97.9°F | Resp 20 | Ht 62.0 in | Wt 164.2 lb

## 2012-10-20 VITALS — BP 111/60 | HR 73 | Temp 97.8°F | Resp 18

## 2012-10-20 DIAGNOSIS — C50219 Malignant neoplasm of upper-inner quadrant of unspecified female breast: Secondary | ICD-10-CM

## 2012-10-20 DIAGNOSIS — D649 Anemia, unspecified: Secondary | ICD-10-CM

## 2012-10-20 DIAGNOSIS — C50412 Malignant neoplasm of upper-outer quadrant of left female breast: Secondary | ICD-10-CM

## 2012-10-20 DIAGNOSIS — T451X5A Adverse effect of antineoplastic and immunosuppressive drugs, initial encounter: Secondary | ICD-10-CM

## 2012-10-20 DIAGNOSIS — D6481 Anemia due to antineoplastic chemotherapy: Secondary | ICD-10-CM

## 2012-10-20 DIAGNOSIS — Z5112 Encounter for antineoplastic immunotherapy: Secondary | ICD-10-CM

## 2012-10-20 DIAGNOSIS — C50419 Malignant neoplasm of upper-outer quadrant of unspecified female breast: Secondary | ICD-10-CM | POA: Insufficient documentation

## 2012-10-20 DIAGNOSIS — R350 Frequency of micturition: Secondary | ICD-10-CM

## 2012-10-20 DIAGNOSIS — Z170 Estrogen receptor positive status [ER+]: Secondary | ICD-10-CM

## 2012-10-20 LAB — CBC WITH DIFFERENTIAL/PLATELET
Basophils Absolute: 0 10*3/uL (ref 0.0–0.1)
Eosinophils Absolute: 0 10*3/uL (ref 0.0–0.5)
HCT: 24.8 % — ABNORMAL LOW (ref 34.8–46.6)
HGB: 7.9 g/dL — ABNORMAL LOW (ref 11.6–15.9)
MCV: 100.4 fL (ref 79.5–101.0)
MONO%: 8.8 % (ref 0.0–14.0)
NEUT#: 3.6 10*3/uL (ref 1.5–6.5)
NEUT%: 58.2 % (ref 38.4–76.8)
RDW: 19.1 % — ABNORMAL HIGH (ref 11.2–14.5)
lymph#: 2 10*3/uL (ref 0.9–3.3)

## 2012-10-20 LAB — COMPREHENSIVE METABOLIC PANEL (CC13)
Albumin: 3.5 g/dL (ref 3.5–5.0)
CO2: 26 mEq/L (ref 22–29)
Glucose: 99 mg/dl (ref 70–99)
Potassium: 3.9 mEq/L (ref 3.5–5.1)
Sodium: 143 mEq/L (ref 136–145)
Total Protein: 6.4 g/dL (ref 6.4–8.3)

## 2012-10-20 LAB — ABO/RH: ABO/RH(D): O POS

## 2012-10-20 MED ORDER — DIPHENHYDRAMINE HCL 25 MG PO CAPS
25.0000 mg | ORAL_CAPSULE | Freq: Once | ORAL | Status: AC
  Administered 2012-10-20: 25 mg via ORAL

## 2012-10-20 MED ORDER — SODIUM CHLORIDE 0.9 % IV SOLN
Freq: Once | INTRAVENOUS | Status: AC
  Administered 2012-10-20: 17:00:00 via INTRAVENOUS

## 2012-10-20 MED ORDER — HEPARIN SOD (PORK) LOCK FLUSH 100 UNIT/ML IV SOLN
500.0000 [IU] | Freq: Once | INTRAVENOUS | Status: AC | PRN
  Administered 2012-10-20: 500 [IU]
  Filled 2012-10-20: qty 5

## 2012-10-20 MED ORDER — DIPHENHYDRAMINE HCL 25 MG PO CAPS: 50.0000 mg | ORAL_CAPSULE | Freq: Once | ORAL | Status: DC

## 2012-10-20 MED ORDER — SODIUM CHLORIDE 0.9 % IJ SOLN
10.0000 mL | INTRAMUSCULAR | Status: DC | PRN
  Administered 2012-10-20: 10 mL
  Filled 2012-10-20: qty 10

## 2012-10-20 MED ORDER — ACETAMINOPHEN 325 MG PO TABS
650.0000 mg | ORAL_TABLET | Freq: Once | ORAL | Status: AC
  Administered 2012-10-20: 650 mg via ORAL

## 2012-10-20 MED ORDER — TRASTUZUMAB CHEMO INJECTION 440 MG
2.0000 mg/kg | Freq: Once | INTRAVENOUS | Status: AC
  Administered 2012-10-20: 147 mg via INTRAVENOUS
  Filled 2012-10-20: qty 7

## 2012-10-20 NOTE — Addendum Note (Signed)
Addended by: Caleb Popp on: 10/20/2012 05:35 PM   Modules accepted: Orders

## 2012-10-20 NOTE — Telephone Encounter (Signed)
appts made and printed. Pt is aware that tx will follow after her ov on 11/03/12. i emailed MW to add a tx for 6/4....td

## 2012-10-20 NOTE — Addendum Note (Signed)
Addended by: Caleb Popp on: 10/20/2012 01:01 PM   Modules accepted: Orders

## 2012-10-20 NOTE — Progress Notes (Signed)
OFFICE PROGRESS NOTE  CCGeorgann Housekeeper, MD [address] [place] [address]  DIAGNOSIS: 67 year old female with invasive ductal carcinoma that was ER +100% PR +100% HER-2/neu positive with a ratio of 2.45. Ki-67 was 55% and elevated tumor was grade 2.  She had a lumpectomy and sentinel node biopsy revealing T1CN1A, stage IIA breast cancer of the left breast.     PRIOR THERAPY: 1. She noted a left breast mass on self throat examination. She underwent a mammogram in December that showed the mass. She went on to have it biopsied on 05/14/2012. The pathology revealed invasive ductal carcinoma that was ER +100% PR +100% HER-2/neu positive with a ratio of 2.45. Ki-67 was 55% and elevated tumor was grade 2. She had an MRI performed that showed the area to be measuring out at 1.6 cm  2. She underwent a lumpectomy with sentinel node biopsy on 06/10/12.  revealing T1CN1A, stage IIA breast cancer of the left breast.  3. She started chemotherapy consisting of TCH on 07/14/12 with 6 cycles planned.    CURRENT THERAPY:  [facility] [id number] with weekly herceptin  INTERVAL HISTORY: TAMAIRA CIRIELLO 67 y.o. female returns for follow up today of her left breast invasive ductal carcinoma.She is here for her weekly herceptin.  She is feeling fatigued today, but otherwise denies fevers, chills, nausea, vomiting, constipation, numbness, skin changes, palpitations, or any further concerns.  She did have one episode of diarrhea.  Otherwise a 10 point ROS is neg.   MEDICAL HISTORY: Past Medical History  Diagnosis Date  . Hyperlipidemia   . Invasive ductal carcinoma of breast 2013    Left  . Arthritis   . Depression   . Heart murmur     heard one when she was pregnamt-maybe had echo-cannot remember  . Hypertension   . GERD (gastroesophageal reflux disease)     ALLERGIES:  has No Known Allergies.  MEDICATIONS:  Current Outpatient Prescriptions  Medication Sig Dispense Refill  . Ascorbic  Acid (VITAMIN C) 1000 MG tablet Take 1,000 mg by mouth daily.        Marland Kitchen aspirin 81 MG tablet Take 81 mg by mouth daily.      Marland Kitchen atenolol (TENORMIN) 50 MG tablet Take 50 mg by mouth daily.      . Cholecalciferol (VITAMIN D) 2000 UNITS tablet Take 2,000 Units by mouth daily.      . citalopram (CELEXA) 10 MG tablet Take 10 mg by mouth daily.       Marland Kitchen dexamethasone (DECADRON) 4 MG tablet Take 2 tablets (8 mg total) by mouth 2 (two) times daily with a meal. Take two times a day the day before Taxotere. Then take two times a day starting the day after chemo for 3 days.  30 tablet  1  . lidocaine-prilocaine (EMLA) cream Apply 1 application topically as needed (used for chemo).      . LORazepam (ATIVAN) 0.5 MG tablet Take 1 tablet (0.5 mg total) by mouth every 6 (six) hours as needed (Nausea or vomiting).  30 tablet  0  . olmesartan-hydrochlorothiazide (BENICAR HCT) 40-12.5 MG per tablet Take 1 tablet by mouth daily.      Marland Kitchen omeprazole (PRILOSEC) 40 MG capsule Take 40 mg by mouth daily.      . ondansetron (ZOFRAN) 8 MG tablet Take 1 tablet (8 mg total) by mouth 2 (two) times daily. Take two times a day starting the day after chemo for 3 days. Then take two  times a day as needed for nausea or vomiting.  30 tablet  1  . prochlorperazine (COMPAZINE) 10 MG tablet Take 1 tablet (10 mg total) by mouth every 6 (six) hours as needed (Nausea or vomiting).  30 tablet  1  . prochlorperazine (COMPAZINE) 25 MG suppository Place 1 suppository (25 mg total) rectally every 12 (twelve) hours as needed for nausea.  12 suppository  3  . simvastatin (ZOCOR) 80 MG tablet Take 40 mg by mouth at bedtime.       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  1 Units  0  . vitamin E 400 UNIT capsule Take 400 Units by mouth daily.         No current facility-administered medications for this visit.    SURGICAL HISTORY:  Past Surgical History  Procedure Laterality Date  . Tubal ligation  1988  . Bladder  suspension  10/2010  . Tonsillectomy and adenoidectomy      age 26  . Abdominal adhesion surgery  1980  . Benign breast biopsy  1994    Left  . Breast lumpectomy with needle localization and axillary sentinel lymph node bx  06/10/2012    Procedure: BREAST LUMPECTOMY WITH NEEDLE LOCALIZATION AND AXILLARY SENTINEL LYMPH NODE BX;  Surgeon: Emelia Loron, MD;  Location: [facility];  Service: General;  Laterality: Left;  . Appendectomy    . Cholecystectomy    . Portacath placement  07/07/2012    Procedure: INSERTION PORT-A-CATH;  Surgeon: Emelia Loron, MD;  Location: [facility];  Service: General;  Laterality: Left;  Port a cath insertion    REVIEW OF SYSTEMS:    General: fatigue (+), night sweats (-), fever (-), pain (-) Lymph: palpable nodes (-) HEENT: vision changes (-), mucositis (-), gum bleeding (-), epistaxis (-) Cardiovascular: chest pain (-), palpitations (-) Pulmonary: shortness of breath (-), dyspnea on exertion (-), cough (-), hemoptysis (-) GI:  Early satiety (-), melena (-), dysphagia (-), nausea/vomiting (-), diarrhea (-) GU: dysuria (-), hematuria (-), incontinence (-) Musculoskeletal: joint swelling (-), joint pain (-), back pain (-) Neuro: weakness (-), numbness (-), headache (-), confusion (-) Skin: Rash (-), lesions (-), dryness (-) Psych: depression (-), suicidal/homicidal ideation (-), feeling of hopelessness (-)   PHYSICAL EXAMINATION: There were no vitals taken for this visit. There is no weight on file to calculate BMI. General: Patient is a well appearing female in no acute distress HEENT: PERRLA, sclerae anicteric no conjunctival pallor, MMM Neck: supple, no palpable adenopathy Lungs: clear to auscultation bilaterally, no wheezes, rhonchi, or rales Cardiovascular: regular rate rhythm, S1, S2,  Systolic murmur, no rubs or gallops Abdomen: Soft, non-tender, non-distended, normoactive bowel sounds, no HSM Extremities: warm and  well perfused, no clubbing, cyanosis, or edema Skin: No rashes or lesions Neuro: Non-focal Breasts:  Left breast incision site well healed, no nodularity, right breast no masses or nodules.  ECOG PERFORMANCE STATUS: 1 - Symptomatic but completely ambulatory   LABORATORY DATA: Lab Results  Component Value Date   WBC 6.2 10/20/2012   HGB 7.9* 10/20/2012   HCT 24.8* 10/20/2012   MCV 100.4 10/20/2012   PLT 76* 10/20/2012      Chemistry      Component Value Date/Time   NA 140 10/13/2012 1002   NA 137 08/09/2012 1935   K 3.9 10/13/2012 1002   K 4.2 08/09/2012 1935   CL 109* 10/13/2012 1002   CL 103 08/09/2012 1935   CO2 21*  10/13/2012 1002   CO2 24 08/09/2012 1935   BUN 27.2* 10/13/2012 1002   BUN 48* 08/09/2012 1935   CREATININE 1.0 10/13/2012 1002   CREATININE 1.13* 08/09/2012 1935      Component Value Date/Time   CALCIUM 8.6 10/13/2012 1002   CALCIUM 9.3 08/09/2012 1935   ALKPHOS 88 10/13/2012 1002   ALKPHOS 65 06/08/2012 0924   AST 16 10/13/2012 1002   AST 22 06/08/2012 0924   ALT 20 10/13/2012 1002   ALT 21 06/08/2012 0924   BILITOT 0.56 10/13/2012 1002   BILITOT 0.5 06/08/2012 0924     FINAL DIAGNOSIS Diagnosis 1. Breast, lumpectomy, Left - INVASIVE DUCTAL CARCINOMA (1.2CM) SEE COMMENT - LYMPHOVASCULAR INVASION IDENTIFIED. - INVASIVE TUMOR IS LESS THAN 0.1 MM FROM NEAREST MARGIN (ANTERIOR). - LYMPHOVASCULAR INVASION IDENTIFIED. - DUCTAL CARCINOMA IN SITU WITH CALCIFICATIONS AND COMEDO NECROSIS. - IN SITU CARCINOMA IS PRESENT AT ANTERIOR MARGIN. - SEE TUMOR SYNOPTIC TEMPLATE BELOW. 2. Lymph node, sentinel, biopsy, Left - ONE LYMPH NODE, POSITIVE FOR METASTATIC MAMMARY CARCINOMA (1/1) - TUMOR DEPOSIT IS 1.1CM - NO EXTRACAPSULAR TUMOR EXTENSION PRESENT. Microscopic Comment 1. BREAST, INVASIVE TUMOR, WITH LYMPH NODE SAMPLING Specimen, including laterality: Left breast. Procedure: Lumpectomy. Grade: II of III Tubule formation: 3 Nuclear pleomorphism: 2 Mitotic:1 Tumor size (gross  measurement): 1.2 cm Margins: Invasive, distance to closest margin: Less than 0.1 mm, see comment. In-situ, distance to closest margin: Present at anterior margin. If margin positive, focally or broadly: Focally Lymphovascular invasion: Present. Ductal carcinoma in situ: Present Grade: III of III Extensive intraductal component: Absent. 1 of 3 FINAL for SHEWANDA, SHARPE ([id number]) Microscopic Comment(continued) Lobular neoplasia: Absent. Tumor focality: Unifocal. Treatment effect: None. If present, treatment effect in breast tissue, lymph nodes or both: N/A Extent of tumor: Skin: Negative for tumor. Nipple: N/A Skeletal muscle: N/A Lymph nodes: # examined: 1 Lymph nodes with metastasis: 1 Macrometastasis: (> 2.0 mm): 1.1 cm Extracapsular extension: Absent. Breast prognostic profile: Estrogen receptor: Not repeated, previous study demonstrated 100% positivity ([id number]) Progesterone receptor: Not repeated, previous study demonstrated 100% positivity ([id number]) Her 2 neu: Repeated, previous study demonstrated amplification at (2.45) ([id number]). Ki-67: Not repeated, previous study demonstrated 55% proliferation rate. Non-neoplastic breast: Previous biopsy site, vascular calcifications, benign fibrocytic change, and microcalcifications in benign ducts and lobules. TNM: pT1c, pN1a, pMX Comments: Although invasive tumor extends into cauterized tissue, there is no invasive tumor definitively identified at the cauterized edge (slides 1A-1C). (BJS:gt, 06/14/12) [place] RUND DO Pathologist, Electronic Signature (Case signed 06/14/2012) Specimen Gross and Clinical Information Specimen(s) Obtained: 1. Breast, lumpectomy, Left 2. Lymph node, sentinel, biopsy, Left Specimen Clinical Information  RADIOGRAPHIC STUDIES:   ASSESSMENT:  67 year old female with  #1 new diagnosis of 1.6 cm (by MRI) invasive ductal carcinoma grade 2 ER +100% PR +100% HER-2/neu amplified with a ratio  of 2.45 with an elevated Ki-67 of 55%. Patient is status post biopsy on 05/14/2012. Patient found a mass on self breast examination. Patient was seen by Dr. Harden Mo and he feels that the patient is a breast conservation candidate with lumpectomy and sentinel lymph node biopsy. She is now being seen for discussion of adjuvant treatment post lumpectomy.   #2 patient and I discussed her pathology in detail. We discussed the significance of grade as well as elevated Ki-67 and HER-2/neu positivity. We discussed use of HER-2 based treatment such as with Herceptin and chemotherapy. We discussed combination Herceptin and chemotherapy consisting of Taxotere carboplatinum and Herceptin. The Taxotere and carboplatinum would be given every  3 weeks for a total of 6 cycles. Herceptin would be given initially weekly with her chemotherapy and then changed to every 3 weeks to finish out one year of HER-2 therapy.   #3 patient certainly will need radiation therapy and she will be seen by Dr. Doristine Devoid.   #4 because patient's tumor is estrogen receptor positive she would also received adjuvant antiestrogen therapy in the form of an aromatase inhibitors and she is postmenopausal. We discussed the different aromatase inhibitors.  #5 urge incontinence  PLAN:  #1 Ms. Sochacki is fatigued today.  She is anemic with her hemoglobin less than 8.  She will proceed with Herceptin today and receive 2 units of PRBCs afterward.   #2 We will see her back next week for labs, an appt, and cycle 6 of chemotherapy.    #4 once she finishes 6 cycles of TCH we will plan on referring her to radiation oncology, I have requested this appointment today. Patient is very much interested in having a few weeks off and I think that is okay after she completes her chemotherapy well she is waiting to get started on radiation.  She will continue Herceptin every 3 weeks starting June 26.  #5 She agreed to see urology regarding the  incontinence. Her appointment with them is June 3.     All questions were answered. The patient knows to call the clinic with any problems, questions or concerns. We can certainly see the patient much sooner if necessary.  I spent 25 minutes counseling the patient face to face. The total time spent in the appointment was 30 minutes.  Cherie Ouch Lyn Hollingshead, NP Medical Oncology [facility] Phone: [phone] 10/20/2012, 9:58 AM

## 2012-10-20 NOTE — Telephone Encounter (Signed)
Per staff message and POF I have scheduled appts.  JMW  

## 2012-10-20 NOTE — Patient Instructions (Addendum)
Blood Transfusion Information WHAT IS A BLOOD TRANSFUSION? A transfusion is the replacement of blood or some of its parts. Blood is made up of multiple cells which provide different functions.  Red blood cells carry oxygen and are used for blood loss replacement.  White blood cells fight against infection.  Platelets control bleeding.  Plasma helps clot blood.  Other blood products are available for specialized needs, such as hemophilia or other clotting disorders. BEFORE THE TRANSFUSION  Who gives blood for transfusions?   You may be able to donate blood to be used at a later date on yourself (autologous donation).  Relatives can be asked to donate blood. This is generally not any safer than if you have received blood from a stranger. The same precautions are taken to ensure safety when a relative's blood is donated.  Healthy volunteers who are fully evaluated to make sure their blood is safe. This is blood bank blood. Transfusion therapy is the safest it has ever been in the practice of medicine. Before blood is taken from a donor, a complete history is taken to make sure that person has no history of diseases nor engages in risky social behavior (examples are intravenous drug use or sexual activity with multiple partners). The donor's travel history is screened to minimize risk of transmitting infections, such as malaria. The donated blood is tested for signs of infectious diseases, such as HIV and hepatitis. The blood is then tested to be sure it is compatible with you in order to minimize the chance of a transfusion reaction. If you or a relative donates blood, this is often done in anticipation of surgery and is not appropriate for emergency situations. It takes many days to process the donated blood. RISKS AND COMPLICATIONS Although transfusion therapy is very safe and saves many lives, the main dangers of transfusion include:   Getting an infectious disease.  Developing a  transfusion reaction. This is an allergic reaction to something in the blood you were given. Every precaution is taken to prevent this. The decision to have a blood transfusion has been considered carefully by your caregiver before blood is given. Blood is not given unless the benefits outweigh the risks. AFTER THE TRANSFUSION  Right after receiving a blood transfusion, you will usually feel much better and more energetic. This is especially true if your red blood cells have gotten low (anemic). The transfusion raises the level of the red blood cells which carry oxygen, and this usually causes an energy increase.  The nurse administering the transfusion will monitor you carefully for complications. HOME CARE INSTRUCTIONS  No special instructions are needed after a transfusion. You may find your energy is better. Speak with your caregiver about any limitations on activity for underlying diseases you may have. SEEK MEDICAL CARE IF:   Your condition is not improving after your transfusion.  You develop redness or irritation at the intravenous (IV) site. SEEK IMMEDIATE MEDICAL CARE IF:  Any of the following symptoms occur over the next 12 hours:  Shaking chills.  You have a temperature by mouth above 102 F (38.9 C), not controlled by medicine.  Chest, back, or muscle pain.  People around you feel you are not acting correctly or are confused.  Shortness of breath or difficulty breathing.  Dizziness and fainting.  You get a rash or develop hives.  You have a decrease in urine output.  Your urine turns a dark color or changes to pink, red, or brown. Any of the following   symptoms occur over the next 10 days:  You have a temperature by mouth above 102 F (38.9 C), not controlled by medicine.  Shortness of breath.  Weakness after normal activity.  The white part of the eye turns yellow (jaundice).  You have a decrease in the amount of urine or are urinating less often.  Your  urine turns a dark color or changes to pink, red, or brown. Document Released: 05/16/2000 Document Revised: 08/11/2011 Document Reviewed: 01/03/2008 ExitCare Patient Information 2014 [employer].  

## 2012-10-20 NOTE — Patient Instructions (Signed)
Blood Transfusion Information WHAT IS A BLOOD TRANSFUSION? A transfusion is the replacement of blood or some of its parts. Blood is made up of multiple cells which provide different functions.  Red blood cells carry oxygen and are used for blood loss replacement.  White blood cells fight against infection.  Platelets control bleeding.  Plasma helps clot blood.  Other blood products are available for specialized needs, such as hemophilia or other clotting disorders. BEFORE THE TRANSFUSION  Who gives blood for transfusions?   You may be able to donate blood to be used at a later date on yourself (autologous donation).  Relatives can be asked to donate blood. This is generally not any safer than if you have received blood from a stranger. The same precautions are taken to ensure safety when a relative's blood is donated.  Healthy volunteers who are fully evaluated to make sure their blood is safe. This is blood bank blood. Transfusion therapy is the safest it has ever been in the practice of medicine. Before blood is taken from a donor, a complete history is taken to make sure that person has no history of diseases nor engages in risky social behavior (examples are intravenous drug use or sexual activity with multiple partners). The donor's travel history is screened to minimize risk of transmitting infections, such as malaria. The donated blood is tested for signs of infectious diseases, such as HIV and hepatitis. The blood is then tested to be sure it is compatible with you in order to minimize the chance of a transfusion reaction. If you or a relative donates blood, this is often done in anticipation of surgery and is not appropriate for emergency situations. It takes many days to process the donated blood. RISKS AND COMPLICATIONS Although transfusion therapy is very safe and saves many lives, the main dangers of transfusion include:   Getting an infectious disease.  Developing a  transfusion reaction. This is an allergic reaction to something in the blood you were given. Every precaution is taken to prevent this. The decision to have a blood transfusion has been considered carefully by your caregiver before blood is given. Blood is not given unless the benefits outweigh the risks. AFTER THE TRANSFUSION  Right after receiving a blood transfusion, you will usually feel much better and more energetic. This is especially true if your red blood cells have gotten low (anemic). The transfusion raises the level of the red blood cells which carry oxygen, and this usually causes an energy increase.  The nurse administering the transfusion will monitor you carefully for complications. HOME CARE INSTRUCTIONS  No special instructions are needed after a transfusion. You may find your energy is better. Speak with your caregiver about any limitations on activity for underlying diseases you may have. SEEK MEDICAL CARE IF:   Your condition is not improving after your transfusion.  You develop redness or irritation at the intravenous (IV) site. SEEK IMMEDIATE MEDICAL CARE IF:  Any of the following symptoms occur over the next 12 hours:  Shaking chills.  You have a temperature by mouth above 102 F (38.9 C), not controlled by medicine.  Chest, back, or muscle pain.  People around you feel you are not acting correctly or are confused.  Shortness of breath or difficulty breathing.  Dizziness and fainting.  You get a rash or develop hives.  You have a decrease in urine output.  Your urine turns a dark color or changes to pink, red, or brown. Any of the following   symptoms occur over the next 10 days:  You have a temperature by mouth above 102 F (38.9 C), not controlled by medicine.  Shortness of breath.  Weakness after normal activity.  The white part of the eye turns yellow (jaundice).  You have a decrease in the amount of urine or are urinating less often.  Your  urine turns a dark color or changes to pink, red, or brown. Document Released: 05/16/2000 Document Revised: 08/11/2011 Document Reviewed: 01/03/2008 Exit[employer] Patient Information 2014 [employer].  

## 2012-10-20 NOTE — Addendum Note (Signed)
Addended by: Tami Ribas D on: 10/20/2012 11:46 AM   Modules accepted: Orders

## 2012-10-20 NOTE — Addendum Note (Signed)
Addended by: Tami Ribas D on: 10/20/2012 05:00 PM   Modules accepted: Orders

## 2012-10-21 LAB — TYPE AND SCREEN: Unit division: 0

## 2012-10-27 ENCOUNTER — Telehealth: Payer: Self-pay | Admitting: Oncology

## 2012-10-27 ENCOUNTER — Other Ambulatory Visit (HOSPITAL_BASED_OUTPATIENT_CLINIC_OR_DEPARTMENT_OTHER): Payer: BC Managed Care – PPO | Admitting: Lab

## 2012-10-27 ENCOUNTER — Ambulatory Visit (HOSPITAL_BASED_OUTPATIENT_CLINIC_OR_DEPARTMENT_OTHER): Payer: BC Managed Care – PPO

## 2012-10-27 ENCOUNTER — Encounter: Payer: Self-pay | Admitting: Adult Health

## 2012-10-27 ENCOUNTER — Ambulatory Visit (HOSPITAL_BASED_OUTPATIENT_CLINIC_OR_DEPARTMENT_OTHER): Payer: BC Managed Care – PPO | Admitting: Adult Health

## 2012-10-27 ENCOUNTER — Other Ambulatory Visit: Payer: BC Managed Care – PPO | Admitting: Lab

## 2012-10-27 ENCOUNTER — Telehealth: Payer: Self-pay | Admitting: *Deleted

## 2012-10-27 VITALS — BP 125/77 | HR 85 | Temp 97.8°F | Resp 20 | Ht 62.0 in | Wt 165.5 lb

## 2012-10-27 DIAGNOSIS — C50412 Malignant neoplasm of upper-outer quadrant of left female breast: Secondary | ICD-10-CM

## 2012-10-27 DIAGNOSIS — Z170 Estrogen receptor positive status [ER+]: Secondary | ICD-10-CM

## 2012-10-27 DIAGNOSIS — N3941 Urge incontinence: Secondary | ICD-10-CM

## 2012-10-27 DIAGNOSIS — C50219 Malignant neoplasm of upper-inner quadrant of unspecified female breast: Secondary | ICD-10-CM

## 2012-10-27 DIAGNOSIS — Z5112 Encounter for antineoplastic immunotherapy: Secondary | ICD-10-CM

## 2012-10-27 DIAGNOSIS — C50419 Malignant neoplasm of upper-outer quadrant of unspecified female breast: Secondary | ICD-10-CM

## 2012-10-27 DIAGNOSIS — Z5111 Encounter for antineoplastic chemotherapy: Secondary | ICD-10-CM

## 2012-10-27 LAB — CBC WITH DIFFERENTIAL/PLATELET
Basophils Absolute: 0 10*3/uL (ref 0.0–0.1)
EOS%: 0.6 % (ref 0.0–7.0)
Eosinophils Absolute: 0 10*3/uL (ref 0.0–0.5)
HGB: 11 g/dL — ABNORMAL LOW (ref 11.6–15.9)
MCH: 31.5 pg (ref 25.1–34.0)
MCV: 95.7 fL (ref 79.5–101.0)
MONO%: 13.3 % (ref 0.0–14.0)
NEUT#: 3.1 10*3/uL (ref 1.5–6.5)
RBC: 3.49 10*6/uL — ABNORMAL LOW (ref 3.70–5.45)
RDW: 19.7 % — ABNORMAL HIGH (ref 11.2–14.5)
lymph#: 2.3 10*3/uL (ref 0.9–3.3)
nRBC: 0 % (ref 0–0)

## 2012-10-27 LAB — COMPREHENSIVE METABOLIC PANEL (CC13)
ALT: 25 U/L (ref 0–55)
AST: 21 U/L (ref 5–34)
Albumin: 3.8 g/dL (ref 3.5–5.0)
Alkaline Phosphatase: 98 U/L (ref 40–150)
Glucose: 74 mg/dl (ref 70–99)
Potassium: 4 mEq/L (ref 3.5–5.1)
Sodium: 139 mEq/L (ref 136–145)
Total Bilirubin: 0.7 mg/dL (ref 0.20–1.20)
Total Protein: 7 g/dL (ref 6.4–8.3)

## 2012-10-27 MED ORDER — SODIUM CHLORIDE 0.9 % IV SOLN
75.0000 mg/m2 | Freq: Once | INTRAVENOUS | Status: AC
  Administered 2012-10-27: 130 mg via INTRAVENOUS
  Filled 2012-10-27: qty 13

## 2012-10-27 MED ORDER — ACETAMINOPHEN 325 MG PO TABS
650.0000 mg | ORAL_TABLET | Freq: Once | ORAL | Status: AC
  Administered 2012-10-27: 650 mg via ORAL

## 2012-10-27 MED ORDER — DIPHENHYDRAMINE HCL 25 MG PO CAPS
50.0000 mg | ORAL_CAPSULE | Freq: Once | ORAL | Status: AC
  Administered 2012-10-27: 50 mg via ORAL

## 2012-10-27 MED ORDER — SODIUM CHLORIDE 0.9 % IV SOLN
524.4000 mg | Freq: Once | INTRAVENOUS | Status: AC
  Administered 2012-10-27: 520 mg via INTRAVENOUS
  Filled 2012-10-27: qty 52

## 2012-10-27 MED ORDER — DEXAMETHASONE SODIUM PHOSPHATE 20 MG/5ML IJ SOLN
20.0000 mg | Freq: Once | INTRAMUSCULAR | Status: AC
  Administered 2012-10-27: 20 mg via INTRAVENOUS

## 2012-10-27 MED ORDER — HEPARIN SOD (PORK) LOCK FLUSH 100 UNIT/ML IV SOLN
500.0000 [IU] | Freq: Once | INTRAVENOUS | Status: AC | PRN
  Administered 2012-10-27: 500 [IU]
  Filled 2012-10-27: qty 5

## 2012-10-27 MED ORDER — TRASTUZUMAB CHEMO INJECTION 440 MG
2.0000 mg/kg | Freq: Once | INTRAVENOUS | Status: AC
  Administered 2012-10-27: 147 mg via INTRAVENOUS
  Filled 2012-10-27: qty 7

## 2012-10-27 MED ORDER — SODIUM CHLORIDE 0.9 % IJ SOLN
10.0000 mL | INTRAMUSCULAR | Status: DC | PRN
Start: 2012-10-27 — End: 2012-10-27
  Administered 2012-10-27: 10 mL
  Filled 2012-10-27: qty 10

## 2012-10-27 MED ORDER — SODIUM CHLORIDE 0.9 % IV SOLN
Freq: Once | INTRAVENOUS | Status: AC
  Administered 2012-10-27: 14:00:00 via INTRAVENOUS

## 2012-10-27 MED ORDER — ONDANSETRON 16 MG/50ML IVPB (CHCC)
16.0000 mg | Freq: Once | INTRAVENOUS | Status: AC
  Administered 2012-10-27: 16 mg via INTRAVENOUS

## 2012-10-27 NOTE — Progress Notes (Signed)
OFFICE PROGRESS NOTE  CCGeorgann Housekeeper, MD [address] [place] [address]  DIAGNOSIS: 67 year old female with invasive ductal carcinoma that was ER +100% PR +100% HER-2/neu positive with a ratio of 2.45. Ki-67 was 55% and elevated tumor was grade 2.  She had a lumpectomy and sentinel node biopsy revealing T1CN1A, stage IIA breast cancer of the left breast.     PRIOR THERAPY: 1. She noted a left breast mass on self throat examination. She underwent a mammogram in December that showed the mass. She went on to have it biopsied on 05/14/2012. The pathology revealed invasive ductal carcinoma that was ER +100% PR +100% HER-2/neu positive with a ratio of 2.45. Ki-67 was 55% and elevated tumor was grade 2. She had an MRI performed that showed the area to be measuring out at 1.6 cm  2. She underwent a lumpectomy with sentinel node biopsy on 06/10/12.  revealing T1CN1A, stage IIA breast cancer of the left breast.  3. She started chemotherapy consisting of TCH on 07/14/12 with 6 cycles planned.    CURRENT THERAPY:  [facility] C6D1 with weekly herceptin  INTERVAL HISTORY: [name] 67 y.o. female returns for follow up today of her left breast invasive ductal carcinoma. She is here prior to her sixth cycle of TCH.  She is doing well today.  Her only complaint is watering eyes.  She has taken many OTC products for this.  Otherwise, she denies fevers, chills, nausea, vomiting, constipation, diarrhea, numbness, skin changes, or any other problems.    MEDICAL HISTORY: Past Medical History  Diagnosis Date  . Hyperlipidemia   . Invasive ductal carcinoma of breast 2013    Left  . Arthritis   . Depression   . Heart murmur     heard one when she was pregnamt-maybe had echo-cannot remember  . Hypertension   . GERD (gastroesophageal reflux disease)     ALLERGIES:  has No Known Allergies.  MEDICATIONS:  Current Outpatient Prescriptions  Medication Sig Dispense Refill  .  Ascorbic Acid (VITAMIN C) 1000 MG tablet Take 1,000 mg by mouth daily.        Marland Kitchen aspirin 81 MG tablet Take 81 mg by mouth daily.      Marland Kitchen atenolol (TENORMIN) 50 MG tablet Take 50 mg by mouth daily.      . Cholecalciferol (VITAMIN D) 2000 UNITS tablet Take 2,000 Units by mouth daily.      . citalopram (CELEXA) 10 MG tablet Take 10 mg by mouth daily.       Marland Kitchen dexamethasone (DECADRON) 4 MG tablet Take 2 tablets (8 mg total) by mouth 2 (two) times daily with a meal. Take two times a day the day before Taxotere. Then take two times a day starting the day after chemo for 3 days.  30 tablet  1  . lidocaine-prilocaine (EMLA) cream Apply 1 application topically as needed (used for chemo).      . LORazepam (ATIVAN) 0.5 MG tablet Take 1 tablet (0.5 mg total) by mouth every 6 (six) hours as needed (Nausea or vomiting).  30 tablet  0  . olmesartan-hydrochlorothiazide (BENICAR HCT) 40-12.5 MG per tablet Take 1 tablet by mouth daily.      Marland Kitchen omeprazole (PRILOSEC) 40 MG capsule Take 40 mg by mouth daily.      . ondansetron (ZOFRAN) 8 MG tablet Take 1 tablet (8 mg total) by mouth 2 (two) times daily. Take two times a day starting the day after chemo  for 3 days. Then take two times a day as needed for nausea or vomiting.  30 tablet  1  . prochlorperazine (COMPAZINE) 10 MG tablet Take 1 tablet (10 mg total) by mouth every 6 (six) hours as needed (Nausea or vomiting).  30 tablet  1  . prochlorperazine (COMPAZINE) 25 MG suppository Place 1 suppository (25 mg total) rectally every 12 (twelve) hours as needed for nausea.  12 suppository  3  . simvastatin (ZOCOR) 80 MG tablet Take 40 mg by mouth at bedtime.       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  1 Units  0  . vitamin E 400 UNIT capsule Take 400 Units by mouth daily.         No current facility-administered medications for this visit.   Facility-Administered Medications Ordered in Other Visits  Medication Dose Route Frequency Provider  Last Rate Last Dose  . diphenhydrAMINE (BENADRYL) capsule 50 mg  50 mg Oral Once Augustin Schooling, NP      . sodium chloride 0.9 % injection 10 mL  10 mL Intracatheter PRN Augustin Schooling, NP   10 mL at 10/20/12 1725    SURGICAL HISTORY:  Past Surgical History  Procedure Laterality Date  . Tubal ligation  1988  . Bladder suspension  10/2010  . Tonsillectomy and adenoidectomy      age 89  . Abdominal adhesion surgery  1980  . Benign breast biopsy  1994    Left  . Breast lumpectomy with needle localization and axillary sentinel lymph node bx  06/10/2012    Procedure: BREAST LUMPECTOMY WITH NEEDLE LOCALIZATION AND AXILLARY SENTINEL LYMPH NODE BX;  Surgeon: Emelia Loron, MD;  Location: [facility];  Service: General;  Laterality: Left;  . Appendectomy    . Cholecystectomy    . Portacath placement  07/07/2012    Procedure: INSERTION PORT-A-CATH;  Surgeon: Emelia Loron, MD;  Location: [facility];  Service: General;  Laterality: Left;  Port a cath insertion    REVIEW OF SYSTEMS:    General: fatigue (-), night sweats (-), fever (-), pain (-) Lymph: palpable nodes (-) HEENT: vision changes (-), mucositis (-), gum bleeding (-), epistaxis (-) Cardiovascular: chest pain (-), palpitations (-) Pulmonary: shortness of breath (-), dyspnea on exertion (-), cough (-), hemoptysis (-) GI:  Early satiety (-), melena (-), dysphagia (-), nausea/vomiting (-), diarrhea (-) GU: dysuria (-), hematuria (-), incontinence (-) Musculoskeletal: joint swelling (-), joint pain (-), back pain (-) Neuro: weakness (-), numbness (-), headache (-), confusion (-) Skin: Rash (-), lesions (-), dryness (-) Psych: depression (-), suicidal/homicidal ideation (-), feeling of hopelessness (-)   PHYSICAL EXAMINATION: Blood pressure 125/77, pulse 85, temperature 97.8 F (36.6 C), temperature source Oral, resp. rate 20, height 5\' 2"  (1.575 m), weight 165 lb 8 oz (75.07 kg). Body mass index  is 30.26 kg/(m^2). General: Patient is a well appearing female in no acute distress HEENT: PERRLA, sclerae anicteric no conjunctival pallor, MMM Neck: supple, no palpable adenopathy Lungs: clear to auscultation bilaterally, no wheezes, rhonchi, or rales Cardiovascular: regular rate rhythm, S1, S2,  Systolic murmur, no rubs or gallops Abdomen: Soft, non-tender, non-distended, normoactive bowel sounds, no HSM Extremities: warm and well perfused, no clubbing, cyanosis, or edema Skin: No rashes or lesions Neuro: Non-focal Breasts:  Left breast incision site well healed, no nodularity, right breast no masses or nodules.  ECOG PERFORMANCE STATUS: 1 - Symptomatic but completely ambulatory   LABORATORY DATA: Lab  Results  Component Value Date   WBC 6.2 10/27/2012   HGB 11.0* 10/27/2012   HCT 33.4* 10/27/2012   MCV 95.7 10/27/2012   PLT 155 10/27/2012      Chemistry      Component Value Date/Time   NA 143 10/20/2012 0946   NA 137 08/09/2012 1935   K 3.9 10/20/2012 0946   K 4.2 08/09/2012 1935   CL 109* 10/20/2012 0946   CL 103 08/09/2012 1935   CO2 26 10/20/2012 0946   CO2 24 08/09/2012 1935   BUN 21.6 10/20/2012 0946   BUN 48* 08/09/2012 1935   CREATININE 1.0 10/20/2012 0946   CREATININE 1.13* 08/09/2012 1935      Component Value Date/Time   CALCIUM 9.2 10/20/2012 0946   CALCIUM 9.3 08/09/2012 1935   ALKPHOS 90 10/20/2012 0946   ALKPHOS 65 06/08/2012 0924   AST 20 10/20/2012 0946   AST 22 06/08/2012 0924   ALT 23 10/20/2012 0946   ALT 21 06/08/2012 0924   BILITOT 0.68 10/20/2012 0946   BILITOT 0.5 06/08/2012 0924     FINAL DIAGNOSIS Diagnosis 1. Breast, lumpectomy, Left - INVASIVE DUCTAL CARCINOMA (1.2CM) SEE COMMENT - LYMPHOVASCULAR INVASION IDENTIFIED. - INVASIVE TUMOR IS LESS THAN 0.1 MM FROM NEAREST MARGIN (ANTERIOR). - LYMPHOVASCULAR INVASION IDENTIFIED. - DUCTAL CARCINOMA IN SITU WITH CALCIFICATIONS AND COMEDO NECROSIS. - IN SITU CARCINOMA IS PRESENT AT ANTERIOR MARGIN. - SEE TUMOR  SYNOPTIC TEMPLATE BELOW. 2. Lymph node, sentinel, biopsy, Left - ONE LYMPH NODE, POSITIVE FOR METASTATIC MAMMARY CARCINOMA (1/1) - TUMOR DEPOSIT IS 1.1CM - NO EXTRACAPSULAR TUMOR EXTENSION PRESENT. Microscopic Comment 1. BREAST, INVASIVE TUMOR, WITH LYMPH NODE SAMPLING Specimen, including laterality: Left breast. Procedure: Lumpectomy. Grade: II of III Tubule formation: 3 Nuclear pleomorphism: 2 Mitotic:1 Tumor size (gross measurement): 1.2 cm Margins: Invasive, distance to closest margin: Less than 0.1 mm, see comment. In-situ, distance to closest margin: Present at anterior margin. If margin positive, focally or broadly: Focally Lymphovascular invasion: Present. Ductal carcinoma in situ: Present Grade: III of III Extensive intraductal component: Absent. 1 of 3 FINAL for [name], [name] ([id number]) Microscopic Comment(continued) Lobular neoplasia: Absent. Tumor focality: Unifocal. Treatment effect: None. If present, treatment effect in breast tissue, lymph nodes or both: N/A Extent of tumor: Skin: Negative for tumor. Nipple: N/A Skeletal muscle: N/A Lymph nodes: # examined: 1 Lymph nodes with metastasis: 1 Macrometastasis: (> 2.0 mm): 1.1 cm Extracapsular extension: Absent. Breast prognostic profile: Estrogen receptor: Not repeated, previous study demonstrated 100% positivity ([id number]) Progesterone receptor: Not repeated, previous study demonstrated 100% positivity ([id number]) Her 2 neu: Repeated, previous study demonstrated amplification at (2.45) ([id number]). Ki-67: Not repeated, previous study demonstrated 55% proliferation rate. Non-neoplastic breast: Previous biopsy site, vascular calcifications, benign fibrocytic change, and microcalcifications in benign ducts and lobules. TNM: pT1c, pN1a, pMX Comments: Although invasive tumor extends into cauterized tissue, there is no invasive tumor definitively identified at the cauterized edge (slides 1A-1C).  (BJS:gt, 06/14/12) [place] RUND DO Pathologist, Electronic Signature (Case signed 06/14/2012) Specimen Gross and Clinical Information Specimen(s) Obtained: 1. Breast, lumpectomy, Left 2. Lymph node, sentinel, biopsy, Left Specimen Clinical Information  RADIOGRAPHIC STUDIES:   ASSESSMENT:  67 year old female with  #1 new diagnosis of 1.6 cm (by MRI) invasive ductal carcinoma grade 2 ER +100% PR +100% HER-2/neu amplified with a ratio of 2.45 with an elevated Ki-67 of 55%. Patient is status post biopsy on 05/14/2012. Patient found a mass on self breast examination. Patient was seen by Dr. Harden Mo and he feels that  the patient is a breast conservation candidate with lumpectomy and sentinel lymph node biopsy. She is now being seen for discussion of adjuvant treatment post lumpectomy.   #2 patient and I discussed her pathology in detail. We discussed the significance of grade as well as elevated Ki-67 and HER-2/neu positivity. We discussed use of HER-2 based treatment such as with Herceptin and chemotherapy. We discussed combination Herceptin and chemotherapy consisting of Taxotere carboplatinum and Herceptin. The Taxotere and carboplatinum would be given every 3 weeks for a total of 6 cycles. Herceptin would be given initially weekly with her chemotherapy and then changed to every 3 weeks to finish out one year of HER-2 therapy.   #3 patient certainly will need radiation therapy and she will be seen by Dr. Doristine Devoid.   #4 because patient's tumor is estrogen receptor positive she would also received adjuvant antiestrogen therapy in the form of an aromatase inhibitors and she is postmenopausal. We discussed the different aromatase inhibitors.  #5 urge incontinence  PLAN:  #1 Ms. Lowdermilk is doing well today.  She will proceed with chemotherapy.    #2 We will see her back next week for labs, an appt, and weekly Herceptin.    #4 once she finishes 6 cycles of TCH we will plan on referring her  to radiation oncology, I have again requested this appointment today. Patient is very much interested in having a few weeks off and I think that is okay after she completes her chemotherapy well she is waiting to get started on radiation.  She will continue Herceptin every 3 weeks starting June 25.  #5 She agreed to see urology regarding the incontinence. Her appointment with them is June 3.     All questions were answered. The patient knows to call the clinic with any problems, questions or concerns. We can certainly see the patient much sooner if necessary.  I spent 25 minutes counseling the patient face to face. The total time spent in the appointment was 30 minutes.  Cherie Ouch Lyn Hollingshead, NP Medical Oncology [facility] Phone: [phone] 10/27/2012, 12:50 PM

## 2012-10-27 NOTE — Telephone Encounter (Signed)
Per staff message and POF I have scheduled appts.  JMW  

## 2012-10-27 NOTE — Patient Instructions (Signed)
Doing well.  Proceed with chemotherapy.  Please call us if you have any questions or concerns.    

## 2012-10-27 NOTE — Patient Instructions (Addendum)
[facility] Discharge Instructions for Patients Receiving Chemotherapy  Today you received the following chemotherapy agents Taxotere, Carboplatin and Herceptin.  To help prevent nausea and vomiting after your treatment, we encourage you to take your nausea medication as prescribed.    If you develop nausea and vomiting that is not controlled by your nausea medication, call the clinic. If it is after clinic hours your family physician or the after hours number for the clinic or go to the Emergency Department.   BELOW ARE SYMPTOMS THAT SHOULD BE REPORTED IMMEDIATELY:  *FEVER GREATER THAN 100.5 F  *CHILLS WITH OR WITHOUT FEVER  NAUSEA AND VOMITING THAT IS NOT CONTROLLED WITH YOUR NAUSEA MEDICATION  *UNUSUAL SHORTNESS OF BREATH  *UNUSUAL BRUISING OR BLEEDING  TENDERNESS IN MOUTH AND THROAT WITH OR WITHOUT PRESENCE OF ULCERS  *URINARY PROBLEMS  *BOWEL PROBLEMS  UNUSUAL RASH Items with * indicate a potential emergency and should be followed up as soon as possible.  Please let the nurse know about any problems that you may have experienced. Feel free to call the clinic you have any questions or concerns. The clinic phone number is [phone].   I have been informed and understand all the instructions given to me. I know to contact the clinic, my physician, or go to the Emergency Department if any problems should occur. I do not have any questions at this time, but understand that I may call the clinic during office hours   should I have any questions or need assistance in obtaining follow up care.    __________________________________________  _____________  __________ Signature of Patient or Authorized Representative            Date                   Time    __________________________________________ Nurse's Signature

## 2012-10-28 ENCOUNTER — Ambulatory Visit (HOSPITAL_BASED_OUTPATIENT_CLINIC_OR_DEPARTMENT_OTHER): Payer: BC Managed Care – PPO

## 2012-10-28 VITALS — BP 127/58 | HR 76 | Temp 97.9°F

## 2012-10-28 DIAGNOSIS — C50412 Malignant neoplasm of upper-outer quadrant of left female breast: Secondary | ICD-10-CM

## 2012-10-28 DIAGNOSIS — Z5189 Encounter for other specified aftercare: Secondary | ICD-10-CM

## 2012-10-28 DIAGNOSIS — C50219 Malignant neoplasm of upper-inner quadrant of unspecified female breast: Secondary | ICD-10-CM

## 2012-10-28 MED ORDER — PEGFILGRASTIM INJECTION 6 MG/0.6ML
6.0000 mg | Freq: Once | SUBCUTANEOUS | Status: AC
  Administered 2012-10-28: 6 mg via SUBCUTANEOUS
  Filled 2012-10-28: qty 0.6

## 2012-11-02 ENCOUNTER — Ambulatory Visit
Admission: RE | Admit: 2012-11-02 | Discharge: 2012-11-02 | Disposition: A | Discharge status: Home / Self Care | Payer: BC Managed Care – PPO | Source: Ambulatory Visit | Attending: Radiation Oncology | Admitting: Radiation Oncology

## 2012-11-02 ENCOUNTER — Encounter: Payer: Self-pay | Admitting: Radiation Oncology

## 2012-11-02 VITALS — BP 119/77 | HR 78 | Temp 98.1°F | Ht 62.0 in | Wt 165.2 lb

## 2012-11-02 DIAGNOSIS — Z170 Estrogen receptor positive status [ER+]: Secondary | ICD-10-CM | POA: Insufficient documentation

## 2012-11-02 DIAGNOSIS — C50112 Malignant neoplasm of central portion of left female breast: Secondary | ICD-10-CM

## 2012-11-02 DIAGNOSIS — C50919 Malignant neoplasm of unspecified site of unspecified female breast: Secondary | ICD-10-CM | POA: Insufficient documentation

## 2012-11-02 DIAGNOSIS — C771 Secondary and unspecified malignant neoplasm of intrathoracic lymph nodes: Secondary | ICD-10-CM | POA: Insufficient documentation

## 2012-11-02 DIAGNOSIS — R5383 Other fatigue: Secondary | ICD-10-CM | POA: Insufficient documentation

## 2012-11-02 DIAGNOSIS — R5381 Other malaise: Secondary | ICD-10-CM | POA: Insufficient documentation

## 2012-11-02 DIAGNOSIS — Z79899 Other long term (current) drug therapy: Secondary | ICD-10-CM | POA: Insufficient documentation

## 2012-11-02 DIAGNOSIS — C50412 Malignant neoplasm of upper-outer quadrant of left female breast: Secondary | ICD-10-CM

## 2012-11-02 DIAGNOSIS — Z7982 Long term (current) use of aspirin: Secondary | ICD-10-CM | POA: Insufficient documentation

## 2012-11-02 DIAGNOSIS — M2550 Pain in unspecified joint: Secondary | ICD-10-CM | POA: Insufficient documentation

## 2012-11-02 DIAGNOSIS — R011 Cardiac murmur, unspecified: Secondary | ICD-10-CM | POA: Insufficient documentation

## 2012-11-02 NOTE — Progress Notes (Signed)
Location of Breast Cancer: left upper outer quadrant  Histology per Pathology Report:  1. Breast, lumpectomy, Left - INVASIVE DUCTAL CARCINOMA (1.2CM) SEE COMMENT - LYMPHOVASCULAR INVASION IDENTIFIED. - INVASIVE TUMOR IS LESS THAN 0.1 MM FROM NEAREST MARGIN (ANTERIOR). - LYMPHOVASCULAR INVASION IDENTIFIED. - DUCTAL CARCINOMA IN SITU WITH CALCIFICATIONS AND COMEDO NECROSIS. - IN SITU CARCINOMA IS PRESENT AT ANTERIOR MARGIN. - SEE TUMOR SYNOPTIC TEMPLATE BELOW. 2. Lymph node, sentinel, biopsy, Left - ONE LYMPH NODE, POSITIVE FOR METASTATIC MAMMARY CARCINOMA (1/1) - TUMOR DEPOSIT IS 1.1CM - NO EXTRACAPSULAR TUMOR EXTENSION PRESENT  Receptor Status: ER(100), PR (100), Her2-neu ( 2.4 5 ), Ki 67 (55%)  Did patient present with symptoms (if so, please note symptoms) or was this found on screening mammography?:  patient found mass in her breast  Past/Anticipated interventions by surgeon, if any: Lumpectomy left breast  Past/Anticipated interventions by medical oncology, if any:  Completed TCH - last on 10/27/12 - 6 cycles.  Herceptin to be given  X 1 year starting 11/03/12  Lymphedema issues, if any:  No  Pain issues, if any:  Aching in joints and lower back since receiving Neupogen - Level 8.  Denies any pain in her left breast  SAFETY ISSUES:  Prior radiation? No  Pacemaker/ICD? NO  Possible current pregnancy? No  Is the patient on methotrexate? No  Current Complaints / other details:  Very fatigued today, eyes tearing and c/o pain in joints and lower back.  Working full time

## 2012-11-02 NOTE — Progress Notes (Addendum)
Radiation Oncology         [phone] ________________________________  Name: [name] MRN: [id number]  Date: 11/02/2012  DOB: 16-Feb-1946  Follow-Up Visit Note  Outpatient  CC: [name], MD  Victorino December, MD  Diagnosis:  [phone] Stage IIA  -- triple positive  Narrative:  The patient returns today for follow-up.    She underwent surgery in the form of lumpectomy, SLN bx on 06-10-12.  Pathology revealed  1. Breast, lumpectomy, Left - INVASIVE DUCTAL CARCINOMA (1.2CM) SEE COMMENT - LYMPHOVASCULAR INVASION IDENTIFIED. - INVASIVE TUMOR IS LESS THAN 0.1 MM FROM NEAREST MARGIN (ANTERIOR). - LYMPHOVASCULAR INVASION IDENTIFIED. - DUCTAL CARCINOMA IN SITU WITH CALCIFICATIONS AND COMEDO NECROSIS. - IN SITU CARCINOMA IS PRESENT AT ANTERIOR MARGIN. - SEE TUMOR SYNOPTIC TEMPLATE BELOW. 2. Lymph node, sentinel, biopsy, Left - ONE LYMPH NODE, POSITIVE FOR METASTATIC MAMMARY CARCINOMA (1/1) - TUMOR DEPOSIT IS 1.1CM - NO EXTRACAPSULAR TUMOR EXTENSION PRESENT.  Per Dr Dwain Sarna, "Her pathology report is a little bit confusing but I discussed this with the pathologist. Her margins are clear."  She started chemotherapy consisting of [facility] on 07/14/12 -- 6th and final cycle given 10/27/12.  She continues on Herceptin.  She c/o aching from Neupogen and tearing of her eyes. +Fatigue. No lymphedema issues. Working full time as a Scientist, physiological.                    ALLERGIES:  has No Known Allergies.  Meds: Current Outpatient Prescriptions  Medication Sig Dispense Refill  . Ascorbic Acid (VITAMIN C) 1000 MG tablet Take 1,000 mg by mouth daily.        Marland Kitchen aspirin 81 MG tablet Take 81 mg by mouth daily.      Marland Kitchen atenolol (TENORMIN) 50 MG tablet Take 50 mg by mouth daily.      . Cholecalciferol (VITAMIN D) 2000 UNITS tablet Take 2,000 Units by mouth daily.      . citalopram (CELEXA) 10 MG tablet Take 10 mg by mouth daily.       Marland Kitchen dexamethasone (DECADRON) 4 MG tablet Take 2 tablets (8 mg  total) by mouth 2 (two) times daily with a meal. Take two times a day the day before Taxotere. Then take two times a day starting the day after chemo for 3 days.  30 tablet  1  . lidocaine-prilocaine (EMLA) cream Apply 1 application topically as needed (used for chemo).      . LORazepam (ATIVAN) 0.5 MG tablet Take 1 tablet (0.5 mg total) by mouth every 6 (six) hours as needed (Nausea or vomiting).  30 tablet  0  . olmesartan-hydrochlorothiazide (BENICAR HCT) 40-12.5 MG per tablet Take 1 tablet by mouth daily.      Marland Kitchen omeprazole (PRILOSEC) 40 MG capsule Take 40 mg by mouth daily.      . ondansetron (ZOFRAN) 8 MG tablet Take 1 tablet (8 mg total) by mouth 2 (two) times daily. Take two times a day starting the day after chemo for 3 days. Then take two times a day as needed for nausea or vomiting.  30 tablet  1  . prochlorperazine (COMPAZINE) 10 MG tablet Take 1 tablet (10 mg total) by mouth every 6 (six) hours as needed (Nausea or vomiting).  30 tablet  1  . prochlorperazine (COMPAZINE) 25 MG suppository Place 1 suppository (25 mg total) rectally every 12 (twelve) hours as needed for nausea.  12 suppository  3  . simvastatin (ZOCOR) 80 MG tablet Take 40  mg by mouth at bedtime.       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  1 Units  0  . vitamin E 400 UNIT capsule Take 400 Units by mouth daily.         No current facility-administered medications for this encounter.   Facility-Administered Medications Ordered in Other Encounters  Medication Dose Route Frequency Provider Last Rate Last Dose  . diphenhydrAMINE (BENADRYL) capsule 50 mg  50 mg Oral Once Augustin Schooling, NP      . sodium chloride 0.9 % injection 10 mL  10 mL Intracatheter PRN Augustin Schooling, NP   10 mL at 10/20/12 1725    Physical Findings: The patient is in no acute distress. Patient is alert and oriented.  height is 5\' 2"  (1.575 m) and weight is 165 lb 3.2 oz (74.934 kg). Her temperature is 98.1  F (36.7 C). Her blood pressure is 119/77 and her pulse is 78. .   General: Alert and oriented, in no acute distress HEENT: Head is normocephalic. Pupils are equally round and reactive to light. Extraocular movements are intact. Oropharynx is clear. Neck: Neck is supple, no palpable cervical or supraclavicular lymphadenopathy. Heart: Regular in rate and rhythm with +systolic murmur loudest in aortic region Chest: Clear to auscultation bilaterally, with no rhonchi, wheezes, or rales. Abdomen: Soft, nontender, nondistended, with no rigidity or guarding. Extremities: No cyanosis or edema. Lymphatics: No concerning lymphadenopathy. Skin: No concerning lesions. Musculoskeletal: symmetric strength and muscle tone throughout. Neurologic: Cranial nerves II through XII are grossly intact. No obvious focalities. Speech is fluent. Coordination is intact. Psychiatric: Judgment and insight are intact. Affect is appropriate. Breasts: no palpable lesions in either breast, nor any palpable lymphadenopathy in the axillary regions. Well healed left periareolar scar.     Lab Findings: Lab Results  Component Value Date   WBC 6.2 10/27/2012   HGB 11.0* 10/27/2012   HCT 33.4* 10/27/2012   MCV 95.7 10/27/2012   PLT 155 10/27/2012    CMP     Component Value Date/Time   NA 139 10/27/2012 1159   NA 137 08/09/2012 1935   K 4.0 10/27/2012 1159   K 4.2 08/09/2012 1935   CL 105 10/27/2012 1159   CL 103 08/09/2012 1935   CO2 25 10/27/2012 1159   CO2 24 08/09/2012 1935   GLUCOSE 74 10/27/2012 1159   GLUCOSE 136* 08/09/2012 1935   BUN 26.2* 10/27/2012 1159   BUN 48* 08/09/2012 1935   CREATININE 0.9 10/27/2012 1159   CREATININE 1.13* 08/09/2012 1935   CALCIUM 9.3 10/27/2012 1159   CALCIUM 9.3 08/09/2012 1935   PROT 7.0 10/27/2012 1159   PROT 7.5 06/08/2012 0924   ALBUMIN 3.8 10/27/2012 1159   ALBUMIN 4.0 06/08/2012 0924   AST 21 10/27/2012 1159   AST 22 06/08/2012 0924   ALT 25 10/27/2012 1159   ALT 21 06/08/2012 0924    ALKPHOS 98 10/27/2012 1159   ALKPHOS 65 06/08/2012 0924   BILITOT 0.70 10/27/2012 1159   BILITOT 0.5 06/08/2012 0924   GFRNONAA 49* 08/09/2012 1935   GFRAA 57* 08/09/2012 1935      Radiographic Findings: No results found.  Impression/Plan:   Lovely 67 yo woman with Stage IIA left breast cancer, triple +.  We discussed adjuvant radiotherapy today.  I recommend whole breast RT with high tangents in order to decrease her risk of a locoregional recurrence by about 2/3.  The risks, benefits and side effects of this treatment  were discussed in detail.  She understands that radiotherapy is associated with skin irritation and fatigue in the acute setting. Late effects can include cosmetic changes and rare injury to internal organs.  Breathhold technique can be pursued to spare her heart.  I anticipate 30 fractions. She is enthusiastic about proceeding with treatment. A consent form has been signed - simulation to occur on 6/13; treatment to start June 30th to allow a break following chemotherapy.  I spent 25 minutes minutes face to face with the patient and more than 50% of that time was spent in counseling and/or coordination of care. _____________________________________   Lonie Peak, MD

## 2012-11-03 ENCOUNTER — Encounter: Payer: Self-pay | Admitting: Adult Health

## 2012-11-03 ENCOUNTER — Other Ambulatory Visit (HOSPITAL_BASED_OUTPATIENT_CLINIC_OR_DEPARTMENT_OTHER): Payer: BC Managed Care – PPO | Admitting: Lab

## 2012-11-03 ENCOUNTER — Ambulatory Visit (HOSPITAL_BASED_OUTPATIENT_CLINIC_OR_DEPARTMENT_OTHER): Payer: BC Managed Care – PPO | Admitting: Adult Health

## 2012-11-03 ENCOUNTER — Encounter: Payer: Self-pay | Admitting: Oncology

## 2012-11-03 ENCOUNTER — Ambulatory Visit (HOSPITAL_BASED_OUTPATIENT_CLINIC_OR_DEPARTMENT_OTHER): Payer: BC Managed Care – PPO

## 2012-11-03 VITALS — BP 111/68 | HR 76 | Temp 98.0°F | Resp 20 | Ht 62.0 in | Wt 164.0 lb

## 2012-11-03 DIAGNOSIS — C50419 Malignant neoplasm of upper-outer quadrant of unspecified female breast: Secondary | ICD-10-CM

## 2012-11-03 DIAGNOSIS — Z5112 Encounter for antineoplastic immunotherapy: Secondary | ICD-10-CM

## 2012-11-03 DIAGNOSIS — C50912 Malignant neoplasm of unspecified site of left female breast: Secondary | ICD-10-CM

## 2012-11-03 DIAGNOSIS — C50219 Malignant neoplasm of upper-inner quadrant of unspecified female breast: Secondary | ICD-10-CM

## 2012-11-03 DIAGNOSIS — C50412 Malignant neoplasm of upper-outer quadrant of left female breast: Secondary | ICD-10-CM

## 2012-11-03 DIAGNOSIS — Z170 Estrogen receptor positive status [ER+]: Secondary | ICD-10-CM

## 2012-11-03 DIAGNOSIS — R350 Frequency of micturition: Secondary | ICD-10-CM

## 2012-11-03 LAB — CBC WITH DIFFERENTIAL/PLATELET
BASO%: 0.3 % (ref 0.0–2.0)
EOS%: 0.2 % (ref 0.0–7.0)
Eosinophils Absolute: 0 10*3/uL (ref 0.0–0.5)
LYMPH%: 34.6 % (ref 14.0–49.7)
MCH: 31.6 pg (ref 25.1–34.0)
MCHC: 32.7 g/dL (ref 31.5–36.0)
MCV: 96.6 fL (ref 79.5–101.0)
MONO%: 21.3 % — ABNORMAL HIGH (ref 0.0–14.0)
Platelets: 191 10*3/uL (ref 145–400)
RBC: 3.2 10*6/uL — ABNORMAL LOW (ref 3.70–5.45)
RDW: 18.7 % — ABNORMAL HIGH (ref 11.2–14.5)
nRBC: 0 % (ref 0–0)

## 2012-11-03 LAB — COMPREHENSIVE METABOLIC PANEL (CC13)
ALT: 22 U/L (ref 0–55)
Alkaline Phosphatase: 114 U/L (ref 40–150)
CO2: 26 mEq/L (ref 22–29)
Creatinine: 1 mg/dL (ref 0.6–1.1)
Sodium: 139 mEq/L (ref 136–145)
Total Bilirubin: 0.65 mg/dL (ref 0.20–1.20)

## 2012-11-03 MED ORDER — SODIUM CHLORIDE 0.9 % IV SOLN
Freq: Once | INTRAVENOUS | Status: AC
  Administered 2012-11-03: 12:00:00 via INTRAVENOUS

## 2012-11-03 MED ORDER — SODIUM CHLORIDE 0.9 % IJ SOLN
10.0000 mL | INTRAMUSCULAR | Status: DC | PRN
  Administered 2012-11-03: 10 mL
  Filled 2012-11-03: qty 10

## 2012-11-03 MED ORDER — ACETAMINOPHEN 325 MG PO TABS
650.0000 mg | ORAL_TABLET | Freq: Once | ORAL | Status: AC
  Administered 2012-11-03: 650 mg via ORAL

## 2012-11-03 MED ORDER — DIPHENHYDRAMINE HCL 25 MG PO CAPS
50.0000 mg | ORAL_CAPSULE | Freq: Once | ORAL | Status: AC
  Administered 2012-11-03: 50 mg via ORAL

## 2012-11-03 MED ORDER — HEPARIN SOD (PORK) LOCK FLUSH 100 UNIT/ML IV SOLN
500.0000 [IU] | Freq: Once | INTRAVENOUS | Status: AC | PRN
  Administered 2012-11-03: 500 [IU]
  Filled 2012-11-03: qty 5

## 2012-11-03 MED ORDER — TRASTUZUMAB CHEMO INJECTION 440 MG
6.0000 mg/kg | Freq: Once | INTRAVENOUS | Status: AC
  Administered 2012-11-03: 441 mg via INTRAVENOUS
  Filled 2012-11-03: qty 21

## 2012-11-03 NOTE — Progress Notes (Signed)
OFFICE PROGRESS NOTE  CCGeorgann Housekeeper, MD [address] [place] [address]  DIAGNOSIS: 67 year old female with invasive ductal carcinoma that was ER +100% PR +100% HER-2/neu positive with a ratio of 2.45. Ki-67 was 55% and elevated tumor was grade 2.  She had a lumpectomy and sentinel node biopsy revealing T1CN1A, stage IIA breast cancer of the left breast.     PRIOR THERAPY: 1. She noted a left breast mass on self throat examination. She underwent a mammogram in December that showed the mass. She went on to have it biopsied on 05/14/2012. The pathology revealed invasive ductal carcinoma that was ER +100% PR +100% HER-2/neu positive with a ratio of 2.45. Ki-67 was 55% and elevated tumor was grade 2. She had an MRI performed that showed the area to be measuring out at 1.6 cm  2. She underwent a lumpectomy with sentinel node biopsy on 06/10/12.  revealing T1CN1A, stage IIA breast cancer of the left breast.  3. She started chemotherapy consisting of TCH on 07/14/12 with 6 cycles planned. Every 3 week Herceptin starting 11/03/12.  4. Radiation therapy starting on 11/29/12   CURRENT THERAPY:  [facility] [id number]   INTERVAL HISTORY: [name] 67 y.o. female returns for follow up today of her left breast invasive ductal carcinoma. She is here after her sixth cycle of TCH.  She is mildly nauseated but otherwise feeling well.  She has occasional intermittent numbness in her toes, but otherwise denies fevers, chills, vomiting, constipation.  She had one episode of diarrhea this morning.  She denies abd. Pain or cramping.  She had an appt with Dr. Lorin Picket Macdiarmid with [facility] and he is following her in regards to her urge incontinence.  She also saw Dr. Basilio Cairo in radiation oncology and she will undergo simulation on 6/13 and start treatment on 6/30.  A 10 point ROS is otherwise negative.   MEDICAL HISTORY: Past Medical History  Diagnosis Date  . Hyperlipidemia   .  Invasive ductal carcinoma of breast 2013    Left  . Arthritis   . Depression   . Heart murmur     heard one when she was pregnamt-maybe had echo-cannot remember  . Hypertension   . GERD (gastroesophageal reflux disease)     ALLERGIES:  has No Known Allergies.  MEDICATIONS:  Current Outpatient Prescriptions  Medication Sig Dispense Refill  . Ascorbic Acid (VITAMIN C) 1000 MG tablet Take 1,000 mg by mouth daily.        Marland Kitchen aspirin 81 MG tablet Take 81 mg by mouth daily.      Marland Kitchen atenolol (TENORMIN) 50 MG tablet Take 50 mg by mouth daily.      . Cholecalciferol (VITAMIN D) 2000 UNITS tablet Take 2,000 Units by mouth daily.      . citalopram (CELEXA) 10 MG tablet Take 10 mg by mouth daily.       Marland Kitchen lidocaine-prilocaine (EMLA) cream Apply 1 application topically as needed (used for chemo).      Marland Kitchen olmesartan-hydrochlorothiazide (BENICAR HCT) 40-12.5 MG per tablet Take 1 tablet by mouth daily.      Marland Kitchen omeprazole (PRILOSEC) 40 MG capsule Take 40 mg by mouth daily.      . simvastatin (ZOCOR) 80 MG tablet Take 40 mg by mouth at bedtime.       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  1 Units  0  . vitamin E 400 UNIT capsule Take 400 Units  by mouth daily.         No current facility-administered medications for this visit.   Facility-Administered Medications Ordered in Other Visits  Medication Dose Route Frequency Provider Last Rate Last Dose  . sodium chloride 0.9 % injection 10 mL  10 mL Intracatheter PRN Augustin Schooling, NP   10 mL at 11/03/12 1310    SURGICAL HISTORY:  Past Surgical History  Procedure Laterality Date  . Tubal ligation  1988  . Bladder suspension  10/2010  . Tonsillectomy and adenoidectomy      age 42  . Abdominal adhesion surgery  1980  . Benign breast biopsy  1994    Left  . Breast lumpectomy with needle localization and axillary sentinel lymph node bx  06/10/2012    Procedure: BREAST LUMPECTOMY WITH NEEDLE LOCALIZATION AND AXILLARY  SENTINEL LYMPH NODE BX;  Surgeon: Emelia Loron, MD;  Location: [facility];  Service: General;  Laterality: Left;  . Appendectomy    . Cholecystectomy    . Portacath placement  07/07/2012    Procedure: INSERTION PORT-A-CATH;  Surgeon: Emelia Loron, MD;  Location: [facility];  Service: General;  Laterality: Left;  Port a cath insertion    REVIEW OF SYSTEMS:    General: fatigue (-), night sweats (-), fever (-), pain (-) Lymph: palpable nodes (-) HEENT: vision changes (-), mucositis (-), gum bleeding (-), epistaxis (-) Cardiovascular: chest pain (-), palpitations (-) Pulmonary: shortness of breath (-), dyspnea on exertion (-), cough (-), hemoptysis (-) GI:  Early satiety (-), melena (-), dysphagia (-), nausea/vomiting (+), diarrhea (+) GU: dysuria (-), hematuria (-), incontinence (-) Musculoskeletal: joint swelling (-), joint pain (-), back pain (-) Neuro: weakness (-), numbness (-), headache (-), confusion (-) Skin: Rash (-), lesions (-), dryness (-) Psych: depression (-), suicidal/homicidal ideation (-), feeling of hopelessness (-)   PHYSICAL EXAMINATION: Blood pressure 111/68, pulse 76, temperature 98 F (36.7 C), temperature source Oral, resp. rate 20, height 5\' 2"  (1.575 m), weight 164 lb (74.39 kg). Body mass index is 29.99 kg/(m^2). General: Patient is a well appearing female in no acute distress HEENT: PERRLA, sclerae anicteric no conjunctival pallor, MMM Neck: supple, no palpable adenopathy Lungs: clear to auscultation bilaterally, no wheezes, rhonchi, or rales Cardiovascular: regular rate rhythm, S1, S2,  Systolic murmur, no rubs or gallops Abdomen: Soft, non-tender, non-distended, normoactive bowel sounds, no HSM Extremities: warm and well perfused, no clubbing, cyanosis, or edema Skin: No rashes or lesions Neuro: Non-focal Breasts:  Left breast incision site well healed, no nodularity, right breast no masses or nodules.  ECOG PERFORMANCE  STATUS: 1 - Symptomatic but completely ambulatory   LABORATORY DATA: Lab Results  Component Value Date   WBC 6.3 11/03/2012   HGB 10.1* 11/03/2012   HCT 30.9* 11/03/2012   MCV 96.6 11/03/2012   PLT 191 11/03/2012      Chemistry      Component Value Date/Time   NA 139 11/03/2012 1048   NA 137 08/09/2012 1935   K 4.3 11/03/2012 1048   K 4.2 08/09/2012 1935   CL 104 11/03/2012 1048   CL 103 08/09/2012 1935   CO2 26 11/03/2012 1048   CO2 24 08/09/2012 1935   BUN 23.5 11/03/2012 1048   BUN 48* 08/09/2012 1935   CREATININE 1.0 11/03/2012 1048   CREATININE 1.13* 08/09/2012 1935      Component Value Date/Time   CALCIUM 9.5 11/03/2012 1048   CALCIUM 9.3 08/09/2012 1935   ALKPHOS 114 11/03/2012 1048  ALKPHOS 65 06/08/2012 0924   AST 25 11/03/2012 1048   AST 22 06/08/2012 0924   ALT 22 11/03/2012 1048   ALT 21 06/08/2012 0924   BILITOT 0.65 11/03/2012 1048   BILITOT 0.5 06/08/2012 0924     FINAL DIAGNOSIS Diagnosis 1. Breast, lumpectomy, Left - INVASIVE DUCTAL CARCINOMA (1.2CM) SEE COMMENT - LYMPHOVASCULAR INVASION IDENTIFIED. - INVASIVE TUMOR IS LESS THAN 0.1 MM FROM NEAREST MARGIN (ANTERIOR). - LYMPHOVASCULAR INVASION IDENTIFIED. - DUCTAL CARCINOMA IN SITU WITH CALCIFICATIONS AND COMEDO NECROSIS. - IN SITU CARCINOMA IS PRESENT AT ANTERIOR MARGIN. - SEE TUMOR SYNOPTIC TEMPLATE BELOW. 2. Lymph node, sentinel, biopsy, Left - ONE LYMPH NODE, POSITIVE FOR METASTATIC MAMMARY CARCINOMA (1/1) - TUMOR DEPOSIT IS 1.1CM - NO EXTRACAPSULAR TUMOR EXTENSION PRESENT. Microscopic Comment 1. BREAST, INVASIVE TUMOR, WITH LYMPH NODE SAMPLING Specimen, including laterality: Left breast. Procedure: Lumpectomy. Grade: II of III Tubule formation: 3 Nuclear pleomorphism: 2 Mitotic:1 Tumor size (gross measurement): 1.2 cm Margins: Invasive, distance to closest margin: Less than 0.1 mm, see comment. In-situ, distance to closest margin: Present at anterior margin. If margin positive, focally or broadly: Focally Lymphovascular  invasion: Present. Ductal carcinoma in situ: Present Grade: III of III Extensive intraductal component: Absent. 1 of 3 FINAL for MARLYSS, CISSELL ([id number]) Microscopic Comment(continued) Lobular neoplasia: Absent. Tumor focality: Unifocal. Treatment effect: None. If present, treatment effect in breast tissue, lymph nodes or both: N/A Extent of tumor: Skin: Negative for tumor. Nipple: N/A Skeletal muscle: N/A Lymph nodes: # examined: 1 Lymph nodes with metastasis: 1 Macrometastasis: (> 2.0 mm): 1.1 cm Extracapsular extension: Absent. Breast prognostic profile: Estrogen receptor: Not repeated, previous study demonstrated 100% positivity ([id number]) Progesterone receptor: Not repeated, previous study demonstrated 100% positivity ([id number]) Her 2 neu: Repeated, previous study demonstrated amplification at (2.45) ([id number]). Ki-67: Not repeated, previous study demonstrated 55% proliferation rate. Non-neoplastic breast: Previous biopsy site, vascular calcifications, benign fibrocytic change, and microcalcifications in benign ducts and lobules. TNM: pT1c, pN1a, pMX Comments: Although invasive tumor extends into cauterized tissue, there is no invasive tumor definitively identified at the cauterized edge (slides 1A-1C). (BJS:gt, 06/14/12) [place] RUND DO Pathologist, Electronic Signature (Case signed 06/14/2012) Specimen Gross and Clinical Information Specimen(s) Obtained: 1. Breast, lumpectomy, Left 2. Lymph node, sentinel, biopsy, Left Specimen Clinical Information  RADIOGRAPHIC STUDIES:   ASSESSMENT:  67 year old female with  #1 new diagnosis of 1.6 cm (by MRI) invasive ductal carcinoma grade 2 ER +100% PR +100% HER-2/neu amplified with a ratio of 2.45 with an elevated Ki-67 of 55%. Patient is status post biopsy on 05/14/2012. Patient found a mass on self breast examination. Patient was seen by Dr. Harden Mo and he feels that the patient is a breast conservation  candidate with lumpectomy and sentinel lymph node biopsy. She is now being seen for discussion of adjuvant treatment post lumpectomy.   #2 patient and I discussed her pathology in detail. We discussed the significance of grade as well as elevated Ki-67 and HER-2/neu positivity. We discussed use of HER-2 based treatment such as with Herceptin and chemotherapy. We discussed combination Herceptin and chemotherapy consisting of Taxotere carboplatinum and Herceptin. The Taxotere and carboplatinum would be given every 3 weeks for a total of 6 cycles. Herceptin would be given initially weekly with her chemotherapy and then changed to every 3 weeks to finish out one year of HER-2 therapy.  She started 07/14/12.   #3 patient certainly will need radiation therapy and she will be seen by Dr. Doristine Devoid.   #4 because patient's tumor  is estrogen receptor positive she would also received adjuvant antiestrogen therapy in the form of an aromatase inhibitors and she is postmenopausal. We discussed the different aromatase inhibitors.  #5 urge incontinence  PLAN:  #1 Ms. Garczynski is doing well today.  She will proceed with Herceptin today.  She will proceed with radiation starting June 30.  We discussed starting an aromatase inhibitor following the completion of radiation therapy.  She is contacting her PCP to get the results of her most recent bone density and will bring these to her 6/25 follow up appointment.   #2 We will see her back in three weeks for labs, an appt, and weekly Herceptin.    #3 She will continue to follow with urology regarding her urge incontinence.     All questions were answered. The patient knows to call the clinic with any problems, questions or concerns. We can certainly see the patient much sooner if necessary.  I spent 25 minutes counseling the patient face to face. The total time spent in the appointment was 30 minutes.  Cherie Ouch Lyn Hollingshead, NP Medical Oncology [facility] Phone: [phone] 11/03/2012, 4:21 PM

## 2012-11-03 NOTE — Patient Instructions (Signed)
Doing well.  Proceed with herceptin.  Please call us if you have any questions or concerns.    

## 2012-11-03 NOTE — Patient Instructions (Signed)
[facility] Discharge Instructions for Patients Receiving Chemotherapy  Today you received the following chemotherapy agents Herceptin  To help prevent nausea and vomiting after your treatment, we encourage you to take your nausea medication     If you develop nausea and vomiting that is not controlled by your nausea medication, call the clinic.   BELOW ARE SYMPTOMS THAT SHOULD BE REPORTED IMMEDIATELY:  *FEVER GREATER THAN 100.5 F  *CHILLS WITH OR WITHOUT FEVER  NAUSEA AND VOMITING THAT IS NOT CONTROLLED WITH YOUR NAUSEA MEDICATION  *UNUSUAL SHORTNESS OF BREATH  *UNUSUAL BRUISING OR BLEEDING  TENDERNESS IN MOUTH AND THROAT WITH OR WITHOUT PRESENCE OF ULCERS  *URINARY PROBLEMS  *BOWEL PROBLEMS  UNUSUAL RASH Items with * indicate a potential emergency and should be followed up as soon as possible.  Feel free to call the clinic you have any questions or concerns. The clinic phone number is [phone].    

## 2012-11-04 NOTE — Addendum Note (Signed)
Encounter addended by: Delynn Flavin, RN on: 11/04/2012  5:23 PM<BR>     Documentation filed: Charges VN

## 2012-11-08 ENCOUNTER — Other Ambulatory Visit: Payer: Self-pay | Admitting: Internal Medicine

## 2012-11-08 ENCOUNTER — Ambulatory Visit
Admission: RE | Admit: 2012-11-08 | Discharge: 2012-11-08 | Disposition: A | Discharge status: Home / Self Care | Payer: BC Managed Care – PPO | Source: Ambulatory Visit | Attending: Internal Medicine | Admitting: Internal Medicine

## 2012-11-08 DIAGNOSIS — Z853 Personal history of malignant neoplasm of breast: Secondary | ICD-10-CM

## 2012-11-09 NOTE — Addendum Note (Signed)
Encounter addended by: Lonie Peak, MD on: 11/09/2012  4:34 PM<BR>     Documentation filed: Notes Section

## 2012-11-12 ENCOUNTER — Ambulatory Visit
Admission: RE | Admit: 2012-11-12 | Discharge: 2012-11-12 | Disposition: A | Discharge status: Home / Self Care | Payer: BC Managed Care – PPO | Source: Ambulatory Visit | Attending: Radiation Oncology | Admitting: Radiation Oncology

## 2012-11-12 DIAGNOSIS — C50412 Malignant neoplasm of upper-outer quadrant of left female breast: Secondary | ICD-10-CM

## 2012-11-12 DIAGNOSIS — Z170 Estrogen receptor positive status [ER+]: Secondary | ICD-10-CM | POA: Insufficient documentation

## 2012-11-12 DIAGNOSIS — Y842 Radiological procedure and radiotherapy as the cause of abnormal reaction of the patient, or of later complication, without mention of misadventure at the time of the procedure: Secondary | ICD-10-CM | POA: Insufficient documentation

## 2012-11-12 DIAGNOSIS — Z510 Encounter for antineoplastic radiation therapy: Secondary | ICD-10-CM | POA: Insufficient documentation

## 2012-11-12 DIAGNOSIS — L988 Other specified disorders of the skin and subcutaneous tissue: Secondary | ICD-10-CM | POA: Insufficient documentation

## 2012-11-12 DIAGNOSIS — C50919 Malignant neoplasm of unspecified site of unspecified female breast: Secondary | ICD-10-CM | POA: Insufficient documentation

## 2012-11-12 NOTE — Progress Notes (Signed)
  Radiation Oncology         [phone] ________________________________  Name: [name] MRN: [id number]  Date: 11/12/2012  DOB: September 17, 1945  SIMULATION AND TREATMENT PLANNING NOTE  Outpatient  DIAGNOSIS:  Left breast cancer  NARRATIVE:  The patient was brought to the CT Simulation planning suite.  Identity was confirmed.  All relevant records and images related to the planned course of therapy were reviewed.  The patient freely provided informed written consent to proceed with treatment after reviewing the details related to the planned course of therapy. The consent form was witnessed and verified by the simulation staff.    Then, the patient was set-up in a stable reproducible  supine position for radiation therapy - arms in breast board, head in accuform.  CT images were obtained.  Surface markings were placed.  The CT images were loaded into the planning software.     RESPIRATORY MOTION MANAGEMENT SIMULATION  NARRATIVE:  In order to account for effect of respiratory motion on target structures and other organs in the planning and delivery of radiotherapy, this patient underwent respiratory motion management simulation.  To accomplish this, when the patient was brought to the CT simulation planning suite, 4D respiratory motion management CT images were obtained.  The CT images were loaded into the planning software.  Then, using a variety of tools including Cine, MIP, and standard views, the target volume was delineated.  Avoidance structures were contoured.  It was determined that the breathhold technique will maximize sparing of the heart from radiation dose.  TREATMENT PLANNING NOTE: Treatment planning then occurred.  The radiation prescription was entered and confirmed.    A total of 3 medically necessary complex treatment devices were fabricated and supervised by me - 2 tangential fields (high tangents) with MLCs to block lung/heart, and accuform device. I have requested : 3D  Simulation.  I have requested a DVH of the following structures: lungs, heart, lumpectomy cavity.     The patient will receive 50 Gy in 25 fraction to the left breast and axilla with high tangents; this will be followed by a boost to the lumpectomy cavity of 10 Gy in 5 fractions.   -----------------------------------  Lonie Peak, MD

## 2012-11-24 ENCOUNTER — Telehealth: Payer: Self-pay | Admitting: Oncology

## 2012-11-24 ENCOUNTER — Encounter: Payer: Self-pay | Admitting: Oncology

## 2012-11-24 ENCOUNTER — Telehealth: Payer: Self-pay | Admitting: *Deleted

## 2012-11-24 ENCOUNTER — Encounter: Payer: Self-pay | Admitting: Adult Health

## 2012-11-24 ENCOUNTER — Ambulatory Visit (HOSPITAL_BASED_OUTPATIENT_CLINIC_OR_DEPARTMENT_OTHER): Payer: BC Managed Care – PPO | Admitting: Adult Health

## 2012-11-24 ENCOUNTER — Other Ambulatory Visit (HOSPITAL_BASED_OUTPATIENT_CLINIC_OR_DEPARTMENT_OTHER): Payer: BC Managed Care – PPO | Admitting: Lab

## 2012-11-24 ENCOUNTER — Ambulatory Visit (HOSPITAL_BASED_OUTPATIENT_CLINIC_OR_DEPARTMENT_OTHER): Payer: BC Managed Care – PPO

## 2012-11-24 DIAGNOSIS — M7989 Other specified soft tissue disorders: Secondary | ICD-10-CM

## 2012-11-24 DIAGNOSIS — I890 Lymphedema, not elsewhere classified: Secondary | ICD-10-CM

## 2012-11-24 DIAGNOSIS — C50912 Malignant neoplasm of unspecified site of left female breast: Secondary | ICD-10-CM

## 2012-11-24 DIAGNOSIS — N3941 Urge incontinence: Secondary | ICD-10-CM

## 2012-11-24 DIAGNOSIS — C50412 Malignant neoplasm of upper-outer quadrant of left female breast: Secondary | ICD-10-CM

## 2012-11-24 DIAGNOSIS — C50219 Malignant neoplasm of upper-inner quadrant of unspecified female breast: Secondary | ICD-10-CM

## 2012-11-24 DIAGNOSIS — C50419 Malignant neoplasm of upper-outer quadrant of unspecified female breast: Secondary | ICD-10-CM

## 2012-11-24 DIAGNOSIS — Z5112 Encounter for antineoplastic immunotherapy: Secondary | ICD-10-CM

## 2012-11-24 DIAGNOSIS — Z170 Estrogen receptor positive status [ER+]: Secondary | ICD-10-CM

## 2012-11-24 LAB — COMPREHENSIVE METABOLIC PANEL (CC13)
AST: 19 U/L (ref 5–34)
Alkaline Phosphatase: 69 U/L (ref 40–150)
BUN: 22.7 mg/dL (ref 7.0–26.0)
Creatinine: 1.1 mg/dL (ref 0.6–1.1)

## 2012-11-24 LAB — CBC WITH DIFFERENTIAL/PLATELET
Basophils Absolute: 0 10*3/uL (ref 0.0–0.1)
EOS%: 2.1 % (ref 0.0–7.0)
HGB: 9.3 g/dL — ABNORMAL LOW (ref 11.6–15.9)
MCH: 32.4 pg (ref 25.1–34.0)
MCV: 100.7 fL (ref 79.5–101.0)
MONO%: 11.8 % (ref 0.0–14.0)
NEUT%: 53 % (ref 38.4–76.8)
RDW: 18.5 % — ABNORMAL HIGH (ref 11.2–14.5)

## 2012-11-24 MED ORDER — HEPARIN SOD (PORK) LOCK FLUSH 100 UNIT/ML IV SOLN
500.0000 [IU] | Freq: Once | INTRAVENOUS | Status: AC | PRN
  Administered 2012-11-24: 500 [IU]
  Filled 2012-11-24: qty 5

## 2012-11-24 MED ORDER — SODIUM CHLORIDE 0.9 % IV SOLN
Freq: Once | INTRAVENOUS | Status: AC
  Administered 2012-11-24: 13:00:00 via INTRAVENOUS

## 2012-11-24 MED ORDER — SODIUM CHLORIDE 0.9 % IJ SOLN
10.0000 mL | INTRAMUSCULAR | Status: DC | PRN
  Administered 2012-11-24: 10 mL
  Filled 2012-11-24: qty 10

## 2012-11-24 MED ORDER — TRASTUZUMAB CHEMO INJECTION 440 MG
6.0000 mg/kg | Freq: Once | INTRAVENOUS | Status: AC
  Administered 2012-11-24: 441 mg via INTRAVENOUS
  Filled 2012-11-24: qty 21

## 2012-11-24 MED ORDER — DIPHENHYDRAMINE HCL 25 MG PO CAPS
50.0000 mg | ORAL_CAPSULE | Freq: Once | ORAL | Status: AC
  Administered 2012-11-24: 25 mg via ORAL

## 2012-11-24 MED ORDER — ACETAMINOPHEN 325 MG PO TABS
650.0000 mg | ORAL_TABLET | Freq: Once | ORAL | Status: AC
  Administered 2012-11-24: 650 mg via ORAL

## 2012-11-24 NOTE — Progress Notes (Signed)
OFFICE PROGRESS NOTE  CCGeorgann Housekeeper, MD [address] [place] [address]  DIAGNOSIS: 67 year old female with invasive ductal carcinoma that was ER +100% PR +100% HER-2/neu positive with a ratio of 2.45. Ki-67 was 55% and elevated tumor was grade 2.  She had a lumpectomy and sentinel node biopsy revealing T1CN1A, stage IIA breast cancer of the left breast.     PRIOR THERAPY: 1. She noted a left breast mass on self breast examination. She underwent a mammogram in December that showed the mass. She went on to have it biopsied on 05/14/2012. The pathology revealed invasive ductal carcinoma that was ER +100% PR +100% HER-2/neu positive with a ratio of 2.45. Ki-67 was 55% and elevated tumor was grade 2. She had an MRI performed that showed the area to be measuring out at 1.6 cm.  2. She underwent a lumpectomy with sentinel node biopsy on 06/10/12.  revealing T1CN1A, stage IIA breast cancer of the left breast.  3. She started chemotherapy consisting of TCH on 07/14/12 with 6 cycles planned. Every 3 week Herceptin starting 11/03/12.  4. Radiation therapy starting on 11/29/12   CURRENT THERAPY:  Herceptin every 3 weeks  INTERVAL HISTORY: [name] 67 y.o. female returns for follow up today of her left breast invasive ductal carcinoma. She is prior to her adjuvant herceptin.  She has swelling in her left arm.  This swelling started one week ago after she had a mammogram.  She has mild swelling in her feet as well which resolves at night time.  She had an appt with Dr. Lorin Picket Macdiarmid with [facility] and he is following her in regards to her urge incontinence.  She also saw Dr. Basilio Cairo in radiation oncology and she will undergo simulation on 6/13 and start treatment on 6/30.  Otherwise a 10 point ROS is negative.    A 10 point ROS is otherwise negative.   MEDICAL HISTORY: Past Medical History  Diagnosis Date  . Hyperlipidemia   . Invasive ductal carcinoma of  breast 2013    Left  . Arthritis   . Depression   . Heart murmur     heard one when she was pregnamt-maybe had echo-cannot remember  . Hypertension   . GERD (gastroesophageal reflux disease)     ALLERGIES:  has No Known Allergies.  MEDICATIONS:  Current Outpatient Prescriptions  Medication Sig Dispense Refill  . Ascorbic Acid (VITAMIN C) 1000 MG tablet Take 1,000 mg by mouth daily.        Marland Kitchen aspirin 81 MG tablet Take 81 mg by mouth daily.      Marland Kitchen atenolol (TENORMIN) 50 MG tablet Take 50 mg by mouth daily.      . Cholecalciferol (VITAMIN D) 2000 UNITS tablet Take 2,000 Units by mouth daily.      . citalopram (CELEXA) 10 MG tablet Take 10 mg by mouth daily.       Marland Kitchen lidocaine-prilocaine (EMLA) cream Apply 1 application topically as needed (used for chemo).      Marland Kitchen olmesartan-hydrochlorothiazide (BENICAR HCT) 40-12.5 MG per tablet Take 1 tablet by mouth daily.      Marland Kitchen omeprazole (PRILOSEC) 40 MG capsule Take 40 mg by mouth daily.      . simvastatin (ZOCOR) 80 MG tablet Take 40 mg by mouth at bedtime.       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  1 Units  0  . vitamin E 400 UNIT capsule  Take 400 Units by mouth daily.         No current facility-administered medications for this visit.    SURGICAL HISTORY:  Past Surgical History  Procedure Laterality Date  . Tubal ligation  1988  . Bladder suspension  10/2010  . Tonsillectomy and adenoidectomy      age 69  . Abdominal adhesion surgery  1980  . Benign breast biopsy  1994    Left  . Breast lumpectomy with needle localization and axillary sentinel lymph node bx  06/10/2012    Procedure: BREAST LUMPECTOMY WITH NEEDLE LOCALIZATION AND AXILLARY SENTINEL LYMPH NODE BX;  Surgeon: Emelia Loron, MD;  Location: [facility];  Service: General;  Laterality: Left;  . Appendectomy    . Cholecystectomy    . Portacath placement  07/07/2012    Procedure: INSERTION PORT-A-CATH;  Surgeon: Emelia Loron, MD;  Location: [facility];  Service: General;  Laterality: Left;  Port a cath insertion    REVIEW OF SYSTEMS:    General: fatigue (-), night sweats (-), fever (-), pain (-) Lymph: palpable nodes (-) HEENT: vision changes (-), mucositis (-), gum bleeding (-), epistaxis (-) Cardiovascular: chest pain (-), palpitations (-) Pulmonary: shortness of breath (-), dyspnea on exertion (-), cough (-), hemoptysis (-) GI:  Early satiety (-), melena (-), dysphagia (-), nausea/vomiting (+), diarrhea (+) GU: dysuria (-), hematuria (-), incontinence (-) Musculoskeletal: joint swelling (-), joint pain (-), back pain (-) Neuro: weakness (-), numbness (-), headache (-), confusion (-) Skin: Rash (-), lesions (-), dryness (-) Psych: depression (-), suicidal/homicidal ideation (-), feeling of hopelessness (-)   PHYSICAL EXAMINATION: There were no vitals taken for this visit. There is no weight on file to calculate BMI. General: Patient is a well appearing female in no acute distress HEENT: PERRLA, sclerae anicteric no conjunctival pallor, MMM Neck: supple, no palpable adenopathy Lungs: clear to auscultation bilaterally, no wheezes, rhonchi, or rales Cardiovascular: regular rate rhythm, S1, S2,  Systolic murmur, no rubs or gallops Abdomen: Soft, non-tender, non-distended, normoactive bowel sounds, no HSM Extremities: warm and well perfused, no clubbing, cyanosis.  Scant edema in bilateral lower extremities, left arm is non-pitting, but noticeably more swollen than right arm 2+ pulses x 4 extremities.  Skin: No rashes or lesions Neuro: Non-focal Breasts:  Left breast incision site well healed, no nodularity, right breast no masses or nodules.  ECOG PERFORMANCE STATUS: 1 - Symptomatic but completely ambulatory   LABORATORY DATA: Lab Results  Component Value Date   WBC 5.3 11/24/2012   HGB 9.3* 11/24/2012   HCT 28.9* 11/24/2012   MCV 100.7 11/24/2012   PLT 146 11/24/2012       Chemistry      Component Value Date/Time   NA 139 11/03/2012 1048   NA 137 08/09/2012 1935   K 4.3 11/03/2012 1048   K 4.2 08/09/2012 1935   CL 104 11/03/2012 1048   CL 103 08/09/2012 1935   CO2 26 11/03/2012 1048   CO2 24 08/09/2012 1935   BUN 23.5 11/03/2012 1048   BUN 48* 08/09/2012 1935   CREATININE 1.0 11/03/2012 1048   CREATININE 1.13* 08/09/2012 1935      Component Value Date/Time   CALCIUM 9.5 11/03/2012 1048   CALCIUM 9.3 08/09/2012 1935   ALKPHOS 114 11/03/2012 1048   ALKPHOS 65 06/08/2012 0924   AST 25 11/03/2012 1048   AST 22 06/08/2012 0924   ALT 22 11/03/2012 1048   ALT 21 06/08/2012 0924   BILITOT  0.65 11/03/2012 1048   BILITOT 0.5 06/08/2012 0924     FINAL DIAGNOSIS Diagnosis 1. Breast, lumpectomy, Left - INVASIVE DUCTAL CARCINOMA (1.2CM) SEE COMMENT - LYMPHOVASCULAR INVASION IDENTIFIED. - INVASIVE TUMOR IS LESS THAN 0.1 MM FROM NEAREST MARGIN (ANTERIOR). - LYMPHOVASCULAR INVASION IDENTIFIED. - DUCTAL CARCINOMA IN SITU WITH CALCIFICATIONS AND COMEDO NECROSIS. - IN SITU CARCINOMA IS PRESENT AT ANTERIOR MARGIN. - SEE TUMOR SYNOPTIC TEMPLATE BELOW. 2. Lymph node, sentinel, biopsy, Left - ONE LYMPH NODE, POSITIVE FOR METASTATIC MAMMARY CARCINOMA (1/1) - TUMOR DEPOSIT IS 1.1CM - NO EXTRACAPSULAR TUMOR EXTENSION PRESENT. Microscopic Comment 1. BREAST, INVASIVE TUMOR, WITH LYMPH NODE SAMPLING Specimen, including laterality: Left breast. Procedure: Lumpectomy. Grade: II of III Tubule formation: 3 Nuclear pleomorphism: 2 Mitotic:1 Tumor size (gross measurement): 1.2 cm Margins: Invasive, distance to closest margin: Less than 0.1 mm, see comment. In-situ, distance to closest margin: Present at anterior margin. If margin positive, focally or broadly: Focally Lymphovascular invasion: Present. Ductal carcinoma in situ: Present Grade: III of III Extensive intraductal component: Absent. 1 of 3 FINAL for [name], [name] ([id number]) Microscopic Comment(continued) Lobular neoplasia:  Absent. Tumor focality: Unifocal. Treatment effect: None. If present, treatment effect in breast tissue, lymph nodes or both: N/A Extent of tumor: Skin: Negative for tumor. Nipple: N/A Skeletal muscle: N/A Lymph nodes: # examined: 1 Lymph nodes with metastasis: 1 Macrometastasis: (> 2.0 mm): 1.1 cm Extracapsular extension: Absent. Breast prognostic profile: Estrogen receptor: Not repeated, previous study demonstrated 100% positivity ([id number]) Progesterone receptor: Not repeated, previous study demonstrated 100% positivity ([id number]) Her 2 neu: Repeated, previous study demonstrated amplification at (2.45) ([id number]). Ki-67: Not repeated, previous study demonstrated 55% proliferation rate. Non-neoplastic breast: Previous biopsy site, vascular calcifications, benign fibrocytic change, and microcalcifications in benign ducts and lobules. TNM: pT1c, pN1a, pMX Comments: Although invasive tumor extends into cauterized tissue, there is no invasive tumor definitively identified at the cauterized edge (slides 1A-1C). (BJS:gt, 06/14/12) [place] RUND DO Pathologist, Electronic Signature (Case signed 06/14/2012) Specimen Gross and Clinical Information Specimen(s) Obtained: 1. Breast, lumpectomy, Left 2. Lymph node, sentinel, biopsy, Left Specimen Clinical Information  RADIOGRAPHIC STUDIES:   ASSESSMENT:  67 year old female with  #1 new diagnosis of 1.6 cm (by MRI) invasive ductal carcinoma grade 2 ER +100% PR +100% HER-2/neu amplified with a ratio of 2.45 with an elevated Ki-67 of 55%. Patient is status post biopsy on 05/14/2012. Patient found a mass on self breast examination. Patient was seen by Dr. Harden Mo and he feels that the patient is a breast conservation candidate with lumpectomy and sentinel lymph node biopsy. She is now being seen for discussion of adjuvant treatment post lumpectomy.   #2 patient and I discussed her pathology in detail. We discussed the significance  of grade as well as elevated Ki-67 and HER-2/neu positivity. We discussed use of HER-2 based treatment such as with Herceptin and chemotherapy. We discussed combination Herceptin and chemotherapy consisting of Taxotere carboplatinum and Herceptin. The Taxotere and carboplatinum would be given every 3 weeks for a total of 6 cycles. Herceptin would be given initially weekly with her chemotherapy and then changed to every 3 weeks to finish out one year of HER-2 therapy.  She started 07/14/12.   #3 patient certainly will need radiation therapy and she will be seen by Dr. Doristine Devoid.   #4 because patient's tumor is estrogen receptor positive she would also received adjuvant antiestrogen therapy in the form of an aromatase inhibitors and she is postmenopausal. We discussed the different aromatase inhibitors.  #5 urge  incontinence  PLAN:  #1 Ms. Egelhoff is doing well today.  She will proceed with Herceptin today.  She will proceed with radiation starting June 30.  We discussed starting an aromatase inhibitor following the completion of radiation therapy.  We will contact her PCP Dr. Rene Paci to get these results.    #2 We will see her back in three weeks for labs, an appt, and weekly Herceptin.    #3 She will continue to follow with urology regarding her urge incontinence.     #4 I will refer her to the lymphedema clinic for her left arm.    All questions were answered. The patient knows to call the clinic with any problems, questions or concerns. We can certainly see the patient much sooner if necessary.  I spent 25 minutes counseling the patient face to face. The total time spent in the appointment was 30 minutes.  Cherie Ouch Lyn Hollingshead, NP Medical Oncology [facility] Phone: [phone] 11/24/2012, 11:33 AM

## 2012-11-24 NOTE — Patient Instructions (Addendum)
Doing well.  Proceed with Herceptin.  We will see you back in 3 weeks.  I will refer you to be evaluated by the lymphedema team.  Please call us if you have any questions or concerns.

## 2012-11-24 NOTE — Telephone Encounter (Signed)
Per staff message and POF I have scheduled appts.  JMW  

## 2012-11-24 NOTE — Patient Instructions (Addendum)
[facility] Discharge Instructions for Patients Receiving Chemotherapy  Today you received the following chemotherapy agents :  Herceptin.  To help prevent nausea and vomiting after your treatment, we encourage you to take your nausea medication as instructed by your physician.   If you develop nausea and vomiting that is not controlled by your nausea medication, call the clinic.   BELOW ARE SYMPTOMS THAT SHOULD BE REPORTED IMMEDIATELY:  *FEVER GREATER THAN 100.5 F  *CHILLS WITH OR WITHOUT FEVER  NAUSEA AND VOMITING THAT IS NOT CONTROLLED WITH YOUR NAUSEA MEDICATION  *UNUSUAL SHORTNESS OF BREATH  *UNUSUAL BRUISING OR BLEEDING  TENDERNESS IN MOUTH AND THROAT WITH OR WITHOUT PRESENCE OF ULCERS  *URINARY PROBLEMS  *BOWEL PROBLEMS  UNUSUAL RASH Items with * indicate a potential emergency and should be followed up as soon as possible.  Feel free to call the clinic you have any questions or concerns. The clinic phone number is [phone].    

## 2012-11-26 ENCOUNTER — Ambulatory Visit
Admission: RE | Admit: 2012-11-26 | Discharge: 2012-11-26 | Disposition: A | Discharge status: Home / Self Care | Payer: BC Managed Care – PPO | Source: Ambulatory Visit | Attending: Radiation Oncology | Admitting: Radiation Oncology

## 2012-11-26 ENCOUNTER — Ambulatory Visit: Payer: BC Managed Care – PPO | Attending: Oncology | Admitting: Physical Therapy

## 2012-11-26 DIAGNOSIS — C50912 Malignant neoplasm of unspecified site of left female breast: Secondary | ICD-10-CM

## 2012-11-26 DIAGNOSIS — IMO0001 Reserved for inherently not codable concepts without codable children: Secondary | ICD-10-CM | POA: Insufficient documentation

## 2012-11-26 DIAGNOSIS — I890 Lymphedema, not elsewhere classified: Secondary | ICD-10-CM | POA: Insufficient documentation

## 2012-11-26 DIAGNOSIS — C50919 Malignant neoplasm of unspecified site of unspecified female breast: Secondary | ICD-10-CM | POA: Insufficient documentation

## 2012-11-26 NOTE — Progress Notes (Signed)
  Radiation Oncology         [phone]) [phone] ________________________________  Name: [name] MRN: [id number]  Date: 11/26/2012  DOB: 23-Mar-1946  Simulation Verification Note  Status: outpatient  NARRATIVE: The patient was brought to the treatment unit and placed in the planned treatment position. The clinical setup was verified. Then port films were obtained and uploaded to the radiation oncology medical record software.  The treatment beams were carefully compared against the planned radiation fields. The position location and shape of the radiation fields was reviewed. They targeted volume of tissue appears to be appropriately covered by the radiation beams. Organs at risk appear to be excluded as planned.  Based on my personal review, I approved the simulation verification. The patient's treatment will proceed as planned.  ------------------------------------------------  Artist Pais Kathrynn Running, M.D.

## 2012-11-29 ENCOUNTER — Ambulatory Visit
Admission: RE | Admit: 2012-11-29 | Discharge: 2012-11-29 | Disposition: A | Discharge status: Home / Self Care | Payer: BC Managed Care – PPO | Source: Ambulatory Visit | Attending: Radiation Oncology | Admitting: Radiation Oncology

## 2012-11-29 ENCOUNTER — Encounter: Payer: Self-pay | Admitting: Radiation Oncology

## 2012-11-29 VITALS — BP 120/50 | HR 69 | Temp 98.6°F | Resp 20 | Wt 168.3 lb

## 2012-11-29 DIAGNOSIS — C50412 Malignant neoplasm of upper-outer quadrant of left female breast: Secondary | ICD-10-CM

## 2012-11-29 MED ORDER — RADIAPLEXRX EX GEL
Freq: Once | CUTANEOUS | Status: AC
  Administered 2012-11-29: 17:00:00 via TOPICAL

## 2012-11-29 MED ORDER — ALRA NON-METALLIC DEODORANT (RAD-ONC)
1.0000 "application " | Freq: Once | TOPICAL | Status: AC
  Administered 2012-11-29: 1 via TOPICAL

## 2012-11-29 NOTE — Progress Notes (Signed)
Weekly rad txs  2nd on left breast completed, gave radiation therapy book, alra and radiaplex gel, brief instructions of use of products, will discuss tomorrow after tx ,no skin changes noted, no pain

## 2012-11-29 NOTE — Addendum Note (Signed)
Encounter addended by: Lowella Petties, RN on: 11/29/2012  5:18 PM<BR>     Documentation filed: Inpatient MAR, Orders

## 2012-11-29 NOTE — Progress Notes (Signed)
,    Weekly Management Note:  outpatient Current Dose:  2 Gy  Projected Dose: 60 Gy   Narrative:  The patient presents for routine under treatment assessment.  CBCT/MVCT images/Port film x-rays were reviewed.  The chart was checked. She is well, no new complaints  Physical Findings:  weight is 168 lb 4.8 oz (76.34 kg). Her oral temperature is 98.6 F (37 C). Her blood pressure is 120/50 and her pulse is 69. Her respiration is 20.  no skin irritation thus far.  Impression:  The patient is tolerating radiotherapy.  Plan:  Continue radiotherapy as planned.  ________________________________   Lonie Peak, M.D.

## 2012-11-30 ENCOUNTER — Ambulatory Visit
Admission: RE | Admit: 2012-11-30 | Discharge: 2012-11-30 | Disposition: A | Discharge status: Home / Self Care | Payer: BC Managed Care – PPO | Source: Ambulatory Visit | Attending: Radiation Oncology | Admitting: Radiation Oncology

## 2012-12-01 ENCOUNTER — Ambulatory Visit
Admission: RE | Admit: 2012-12-01 | Discharge: 2012-12-01 | Disposition: A | Discharge status: Home / Self Care | Payer: BC Managed Care – PPO | Source: Ambulatory Visit | Attending: Radiation Oncology | Admitting: Radiation Oncology

## 2012-12-02 ENCOUNTER — Ambulatory Visit
Admission: RE | Admit: 2012-12-02 | Discharge: 2012-12-02 | Disposition: A | Discharge status: Home / Self Care | Payer: BC Managed Care – PPO | Source: Ambulatory Visit | Attending: Radiation Oncology | Admitting: Radiation Oncology

## 2012-12-06 ENCOUNTER — Ambulatory Visit
Admission: RE | Admit: 2012-12-06 | Discharge: 2012-12-06 | Disposition: A | Discharge status: Home / Self Care | Payer: BC Managed Care – PPO | Source: Ambulatory Visit | Attending: Radiation Oncology | Admitting: Radiation Oncology

## 2012-12-07 ENCOUNTER — Ambulatory Visit
Admission: RE | Admit: 2012-12-07 | Discharge: 2012-12-07 | Disposition: A | Discharge status: Home / Self Care | Payer: BC Managed Care – PPO | Source: Ambulatory Visit | Attending: Radiation Oncology | Admitting: Radiation Oncology

## 2012-12-08 ENCOUNTER — Ambulatory Visit
Admission: RE | Admit: 2012-12-08 | Discharge: 2012-12-08 | Disposition: A | Discharge status: Home / Self Care | Payer: BC Managed Care – PPO | Source: Ambulatory Visit | Attending: Radiation Oncology | Admitting: Radiation Oncology

## 2012-12-08 ENCOUNTER — Ambulatory Visit
Admission: RE | Admit: 2012-12-08 | Payer: BC Managed Care – PPO | Source: Ambulatory Visit | Admitting: Radiation Oncology

## 2012-12-08 VITALS — BP 127/54 | HR 66 | Temp 98.2°F | Wt 165.8 lb

## 2012-12-08 DIAGNOSIS — C50112 Malignant neoplasm of central portion of left female breast: Secondary | ICD-10-CM

## 2012-12-08 NOTE — Progress Notes (Signed)
[name] here for weekly under treat visit.  She has had 7 fractions to her left breast.  She denies pain.  She does have occasional fatigue.  The skin on her left breast is intact.  She is using radiaplex gel twice a day.  She does have a concern that both of her eyes water continuously and she has been told this was from chemotherapy.  She is wondering if this will ever stop or if there is something she could take for it.  She states it is very uncomfortable for her especially at work.

## 2012-12-08 NOTE — Progress Notes (Signed)
   Weekly Management Note:  outpatient Current Dose:  14 Gy  Projected Dose: 60 Gy   Narrative:  The patient presents for routine under treatment assessment.  CBCT/MVCT images/Port film x-rays were reviewed.  The chart was checked. Doing well. Has a crucial vacation in [place]. Not intending to cancel. Eyes tearing a lot from systemic therapy.  Physical Findings:  weight is 165 lb 12.8 oz (75.206 kg). Her temperature is 98.2 F (36.8 C). Her blood pressure is 127/54 and her pulse is 66.  no skin irritation over left breast. Eyes are tearing.  Impression:  The patient is tolerating radiotherapy.  Plan:  Continue radiotherapy as planned. I recommended she discuss her eyes with medical oncology. She understands that taking a 4 day break in addition to the weekend is not standard for radiotherapy. There could be a small detriment in terms of efficacy from the radiotherapy. However, the patient is willing to come in early on July 23 if an appointment slot is open. I will talk to her therapists about this and she will address this with the therapisst tomorrow.  ________________________________   Lonie Peak, M.D.

## 2012-12-09 ENCOUNTER — Ambulatory Visit
Admission: RE | Admit: 2012-12-09 | Discharge: 2012-12-09 | Disposition: A | Discharge status: Home / Self Care | Payer: BC Managed Care – PPO | Source: Ambulatory Visit | Attending: Radiation Oncology | Admitting: Radiation Oncology

## 2012-12-10 ENCOUNTER — Ambulatory Visit
Admission: RE | Admit: 2012-12-10 | Discharge: 2012-12-10 | Disposition: A | Discharge status: Home / Self Care | Payer: BC Managed Care – PPO | Source: Ambulatory Visit | Attending: Radiation Oncology | Admitting: Radiation Oncology

## 2012-12-13 ENCOUNTER — Ambulatory Visit
Admission: RE | Admit: 2012-12-13 | Discharge: 2012-12-13 | Disposition: A | Discharge status: Home / Self Care | Payer: BC Managed Care – PPO | Source: Ambulatory Visit | Attending: Radiation Oncology | Admitting: Radiation Oncology

## 2012-12-13 ENCOUNTER — Encounter: Payer: Self-pay | Admitting: Radiation Oncology

## 2012-12-13 VITALS — BP 133/79 | HR 61 | Temp 97.6°F | Resp 20 | Wt 167.0 lb

## 2012-12-13 DIAGNOSIS — C50112 Malignant neoplasm of central portion of left female breast: Secondary | ICD-10-CM

## 2012-12-13 NOTE — Progress Notes (Signed)
Weekly rad txs left breast 10 compleeted so far, under inframmary fold slight eythema, no c/o pain, using radiaplex gel bid,

## 2012-12-13 NOTE — Progress Notes (Signed)
Weekly Management Note:  Site: Left breast Current Dose:  2000 cGy Projected Dose: 5000  cGy  Narrative: The patient is seen today for routine under treatment assessment. CBCT/MVCT images/port films were reviewed. The chart was reviewed.   She is without complaints today. She uses Radioplex gel.  Physical Examination:  Filed Vitals:   12/13/12 1645  BP: 133/79  Pulse: 61  Temp: 97.6 F (36.4 C)  Resp: 20  .  Weight: 167 lb (75.751 kg). There is slight erythema along the inframammary region. No areas of desquamation.  Impression: Tolerating radiation therapy well.  Plan: Continue radiation therapy as planned.

## 2012-12-14 ENCOUNTER — Ambulatory Visit
Admission: RE | Admit: 2012-12-14 | Discharge: 2012-12-14 | Disposition: A | Discharge status: Home / Self Care | Payer: BC Managed Care – PPO | Source: Ambulatory Visit | Attending: Radiation Oncology | Admitting: Radiation Oncology

## 2012-12-14 NOTE — Addendum Note (Signed)
Encounter addended by: Noralee Space, RN on: 12/14/2012  9:28 AM<BR>     Documentation filed: Orders

## 2012-12-15 ENCOUNTER — Ambulatory Visit
Admission: RE | Admit: 2012-12-15 | Discharge: 2012-12-15 | Disposition: A | Discharge status: Home / Self Care | Payer: BC Managed Care – PPO | Source: Ambulatory Visit | Attending: Radiation Oncology | Admitting: Radiation Oncology

## 2012-12-15 ENCOUNTER — Other Ambulatory Visit (HOSPITAL_BASED_OUTPATIENT_CLINIC_OR_DEPARTMENT_OTHER): Payer: BC Managed Care – PPO | Admitting: Lab

## 2012-12-15 ENCOUNTER — Ambulatory Visit (HOSPITAL_BASED_OUTPATIENT_CLINIC_OR_DEPARTMENT_OTHER): Payer: BC Managed Care – PPO | Admitting: Adult Health

## 2012-12-15 ENCOUNTER — Other Ambulatory Visit: Payer: Self-pay | Admitting: Oncology

## 2012-12-15 ENCOUNTER — Encounter: Payer: Self-pay | Admitting: Adult Health

## 2012-12-15 ENCOUNTER — Encounter: Payer: Self-pay | Admitting: Oncology

## 2012-12-15 ENCOUNTER — Ambulatory Visit (HOSPITAL_BASED_OUTPATIENT_CLINIC_OR_DEPARTMENT_OTHER): Payer: BC Managed Care – PPO

## 2012-12-15 VITALS — BP 133/67 | HR 68 | Temp 98.1°F | Resp 20 | Ht 62.0 in | Wt 164.8 lb

## 2012-12-15 DIAGNOSIS — C50419 Malignant neoplasm of upper-outer quadrant of unspecified female breast: Secondary | ICD-10-CM

## 2012-12-15 DIAGNOSIS — C50412 Malignant neoplasm of upper-outer quadrant of left female breast: Secondary | ICD-10-CM

## 2012-12-15 DIAGNOSIS — C50219 Malignant neoplasm of upper-inner quadrant of unspecified female breast: Secondary | ICD-10-CM

## 2012-12-15 DIAGNOSIS — R599 Enlarged lymph nodes, unspecified: Secondary | ICD-10-CM

## 2012-12-15 DIAGNOSIS — N3941 Urge incontinence: Secondary | ICD-10-CM

## 2012-12-15 DIAGNOSIS — H1013 Acute atopic conjunctivitis, bilateral: Secondary | ICD-10-CM

## 2012-12-15 DIAGNOSIS — C50912 Malignant neoplasm of unspecified site of left female breast: Secondary | ICD-10-CM

## 2012-12-15 DIAGNOSIS — Z5112 Encounter for antineoplastic immunotherapy: Secondary | ICD-10-CM

## 2012-12-15 DIAGNOSIS — Z170 Estrogen receptor positive status [ER+]: Secondary | ICD-10-CM

## 2012-12-15 LAB — COMPREHENSIVE METABOLIC PANEL (CC13)
ALT: 18 U/L (ref 0–55)
AST: 18 U/L (ref 5–34)
BUN: 19.5 mg/dL (ref 7.0–26.0)
CO2: 26 mEq/L (ref 22–29)
Creatinine: 1 mg/dL (ref 0.6–1.1)
Total Bilirubin: 0.86 mg/dL (ref 0.20–1.20)

## 2012-12-15 LAB — CBC WITH DIFFERENTIAL/PLATELET
BASO%: 0.4 % (ref 0.0–2.0)
EOS%: 1.9 % (ref 0.0–7.0)
HCT: 29.7 % — ABNORMAL LOW (ref 34.8–46.6)
LYMPH%: 27 % (ref 14.0–49.7)
MCH: 34.9 pg — ABNORMAL HIGH (ref 25.1–34.0)
MCHC: 34.8 g/dL (ref 31.5–36.0)
NEUT%: 62.8 % (ref 38.4–76.8)
Platelets: 136 10*3/uL — ABNORMAL LOW (ref 145–400)

## 2012-12-15 MED ORDER — ACETAMINOPHEN 325 MG PO TABS
650.0000 mg | ORAL_TABLET | Freq: Once | ORAL | Status: AC
  Administered 2012-12-15: 650 mg via ORAL

## 2012-12-15 MED ORDER — SODIUM CHLORIDE 0.9 % IV SOLN
Freq: Once | INTRAVENOUS | Status: AC
  Administered 2012-12-15: 12:00:00 via INTRAVENOUS

## 2012-12-15 MED ORDER — HEPARIN SOD (PORK) LOCK FLUSH 100 UNIT/ML IV SOLN
500.0000 [IU] | Freq: Once | INTRAVENOUS | Status: AC | PRN
  Administered 2012-12-15: 500 [IU]
  Filled 2012-12-15: qty 5

## 2012-12-15 MED ORDER — TRASTUZUMAB CHEMO INJECTION 440 MG
6.0000 mg/kg | Freq: Once | INTRAVENOUS | Status: AC
  Administered 2012-12-15: 441 mg via INTRAVENOUS
  Filled 2012-12-15: qty 21

## 2012-12-15 MED ORDER — DIPHENHYDRAMINE HCL 25 MG PO CAPS
50.0000 mg | ORAL_CAPSULE | Freq: Once | ORAL | Status: AC
  Administered 2012-12-15: 25 mg via ORAL

## 2012-12-15 MED ORDER — SODIUM CHLORIDE 0.9 % IJ SOLN
10.0000 mL | INTRAMUSCULAR | Status: DC | PRN
  Administered 2012-12-15: 10 mL
  Filled 2012-12-15: qty 10

## 2012-12-15 MED ORDER — AZELASTINE HCL 0.05 % OP SOLN: 1.0000 [drp] | Freq: Two times a day (BID) | OPHTHALMIC | Status: DC

## 2012-12-15 NOTE — Patient Instructions (Signed)

## 2012-12-15 NOTE — Progress Notes (Signed)
OFFICE PROGRESS NOTE  CCGeorgann Housekeeper, MD [address] [place] [address]  DIAGNOSIS: 67 year old female with invasive ductal carcinoma that was ER +100% PR +100% HER-2/neu positive with a ratio of 2.45. Ki-67 was 55% and elevated tumor was grade 2.  She had a lumpectomy and sentinel node biopsy revealing T1CN1A, stage IIA breast cancer of the left breast.     PRIOR THERAPY: 1. She noted a left breast mass on self breast examination. She underwent a mammogram in December that showed the mass. She went on to have it biopsied on 05/14/2012. The pathology revealed invasive ductal carcinoma that was ER +100% PR +100% HER-2/neu positive with a ratio of 2.45. Ki-67 was 55% and elevated tumor was grade 2. She had an MRI performed that showed the area to be measuring out at 1.6 cm.  2. She underwent a lumpectomy with sentinel node biopsy on 06/10/12.  revealing T1CN1A, stage IIA breast cancer of the left breast.  3. She started chemotherapy consisting of TCH on 07/14/12 with 6 cycles planned. Every 3 week Herceptin starting 11/03/12.  4. Radiation therapy starting on 11/29/12   CURRENT THERAPY:  Herceptin every 3 weeks  INTERVAL HISTORY: [name] 67 y.o. female returns for follow up today of her left breast invasive ductal carcinoma prior to her adjuvant herceptin.  She has started radiation therapy and is doing well.  Her skin has darkened, however it hasn't gotten red or started peeling.  She is only concerned about the constant dripping from her eyes.  She has no redness, swelling, vision changes, or any concerns with her eyes just the constant tearing.  She deneis fevers, chills, nausea, vomiting, constipation, diarrhea, numbness, PND, DOE, orthopnea, swelling, or any further concerns.  A 10 point ROS is otherwise negative.   MEDICAL HISTORY: Past Medical History  Diagnosis Date  . Hyperlipidemia   . Invasive ductal carcinoma of breast 2013    Left  . Arthritis    . Depression   . Heart murmur     heard one when she was pregnamt-maybe had echo-cannot remember  . Hypertension   . GERD (gastroesophageal reflux disease)     ALLERGIES:  has No Known Allergies.  MEDICATIONS:  Current Outpatient Prescriptions  Medication Sig Dispense Refill  . Ascorbic Acid (VITAMIN C) 1000 MG tablet Take 1,000 mg by mouth daily.        Marland Kitchen aspirin 81 MG tablet Take 81 mg by mouth daily.      Marland Kitchen atenolol (TENORMIN) 50 MG tablet Take 50 mg by mouth daily.      . Cholecalciferol (VITAMIN D) 2000 UNITS tablet Take 2,000 Units by mouth daily.      . citalopram (CELEXA) 10 MG tablet Take 10 mg by mouth daily.       . hyaluronate sodium (RADIAPLEXRX) GEL Apply topically 2 (two) times daily. Apply bid, after rad tx and in am, afternoon tx, just not 4 h prior to rad txs      . lidocaine-prilocaine (EMLA) cream Apply 1 application topically as needed (used for chemo).      . mirabegron ER (MYRBETRIQ) 50 MG TB24 Take 50 mg by mouth daily.      . non-metallic deodorant Thornton Papas) MISC Apply 1 application topically daily.      Marland Kitchen olmesartan-hydrochlorothiazide (BENICAR HCT) 40-12.5 MG per tablet Take 1 tablet by mouth daily.      Marland Kitchen omeprazole (PRILOSEC) 40 MG capsule Take 40 mg by mouth daily.      Marland Kitchen  simvastatin (ZOCOR) 80 MG tablet Take 40 mg by mouth at bedtime.       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  1 Units  0  . vitamin E 400 UNIT capsule Take 400 Units by mouth daily.         No current facility-administered medications for this visit.    SURGICAL HISTORY:  Past Surgical History  Procedure Laterality Date  . Tubal ligation  1988  . Bladder suspension  10/2010  . Tonsillectomy and adenoidectomy      age 45  . Abdominal adhesion surgery  1980  . Benign breast biopsy  1994    Left  . Breast lumpectomy with needle localization and axillary sentinel lymph node bx  06/10/2012    Procedure: BREAST LUMPECTOMY WITH NEEDLE LOCALIZATION AND  AXILLARY SENTINEL LYMPH NODE BX;  Surgeon: Emelia Loron, MD;  Location: [facility];  Service: General;  Laterality: Left;  . Appendectomy    . Cholecystectomy    . Portacath placement  07/07/2012    Procedure: INSERTION PORT-A-CATH;  Surgeon: Emelia Loron, MD;  Location: [facility];  Service: General;  Laterality: Left;  Port a cath insertion    REVIEW OF SYSTEMS:    General: fatigue (-), night sweats (-), fever (-), pain (-) Lymph: palpable nodes (-) HEENT: vision changes (-), mucositis (-), gum bleeding (-), epistaxis (-) Cardiovascular: chest pain (-), palpitations (-) Pulmonary: shortness of breath (-), dyspnea on exertion (-), cough (-), hemoptysis (-) GI:  Early satiety (-), melena (-), dysphagia (-), nausea/vomiting (-), diarrhea (-) GU: dysuria (-), hematuria (-), incontinence (-) Musculoskeletal: joint swelling (-), joint pain (-), back pain (-) Neuro: weakness (-), numbness (-), headache (-), confusion (-) Skin: Rash (-), lesions (-), dryness (-) Psych: depression (-), suicidal/homicidal ideation (-), feeling of hopelessness (-)   PHYSICAL EXAMINATION: Blood pressure 133/67, pulse 68, temperature 98.1 F (36.7 C), temperature source Oral, resp. rate 20, height 5\' 2"  (1.575 m), weight 164 lb 12.8 oz (74.753 kg). Body mass index is 30.13 kg/(m^2). General: Patient is a well appearing female in no acute distress HEENT: PERRLA, sclerae anicteric no conjunctival pallor, MMM Neck: supple, no palpable adenopathy Lungs: clear to auscultation bilaterally, no wheezes, rhonchi, or rales Cardiovascular: regular rate rhythm, S1, S2,  Systolic murmur, no rubs or gallops Abdomen: Soft, non-tender, non-distended, normoactive bowel sounds, no HSM Extremities: warm and well perfused, no clubbing, cyanosis.  Scant edema in bilateral lower extremities, left arm is non-pitting, but noticeably more swollen than right arm 2+ pulses x 4 extremities.  Skin: No  rashes or lesions Neuro: Non-focal Breasts:  Left breast incision site well healed, no nodularity, right breast no masses or nodules.  ECOG PERFORMANCE STATUS: 1 - Symptomatic but completely ambulatory   LABORATORY DATA: Lab Results  Component Value Date   WBC 3.5* 12/15/2012   HGB 10.3* 12/15/2012   HCT 29.7* 12/15/2012   MCV 100.2 12/15/2012   PLT 136* 12/15/2012      Chemistry      Component Value Date/Time   NA 141 11/24/2012 1043   NA 137 08/09/2012 1935   K 3.9 11/24/2012 1043   K 4.2 08/09/2012 1935   CL 108* 11/24/2012 1043   CL 103 08/09/2012 1935   CO2 26 11/24/2012 1043   CO2 24 08/09/2012 1935   BUN 22.7 11/24/2012 1043   BUN 48* 08/09/2012 1935   CREATININE 1.1 11/24/2012 1043   CREATININE 1.13* 08/09/2012 1935  Component Value Date/Time   CALCIUM 9.2 11/24/2012 1043   CALCIUM 9.3 08/09/2012 1935   ALKPHOS 69 11/24/2012 1043   ALKPHOS 65 06/08/2012 0924   AST 19 11/24/2012 1043   AST 22 06/08/2012 0924   ALT 16 11/24/2012 1043   ALT 21 06/08/2012 0924   BILITOT 0.67 11/24/2012 1043   BILITOT 0.5 06/08/2012 0924     FINAL DIAGNOSIS Diagnosis 1. Breast, lumpectomy, Left - INVASIVE DUCTAL CARCINOMA (1.2CM) SEE COMMENT - LYMPHOVASCULAR INVASION IDENTIFIED. - INVASIVE TUMOR IS LESS THAN 0.1 MM FROM NEAREST MARGIN (ANTERIOR). - LYMPHOVASCULAR INVASION IDENTIFIED. - DUCTAL CARCINOMA IN SITU WITH CALCIFICATIONS AND COMEDO NECROSIS. - IN SITU CARCINOMA IS PRESENT AT ANTERIOR MARGIN. - SEE TUMOR SYNOPTIC TEMPLATE BELOW. 2. Lymph node, sentinel, biopsy, Left - ONE LYMPH NODE, POSITIVE FOR METASTATIC MAMMARY CARCINOMA (1/1) - TUMOR DEPOSIT IS 1.1CM - NO EXTRACAPSULAR TUMOR EXTENSION PRESENT. Microscopic Comment 1. BREAST, INVASIVE TUMOR, WITH LYMPH NODE SAMPLING Specimen, including laterality: Left breast. Procedure: Lumpectomy. Grade: II of III Tubule formation: 3 Nuclear pleomorphism: 2 Mitotic:1 Tumor size (gross measurement): 1.2 cm Margins: Invasive, distance to  closest margin: Less than 0.1 mm, see comment. In-situ, distance to closest margin: Present at anterior margin. If margin positive, focally or broadly: Focally Lymphovascular invasion: Present. Ductal carcinoma in situ: Present Grade: III of III Extensive intraductal component: Absent. 1 of 3 FINAL for KERRI, KOVACIK ([id number]) Microscopic Comment(continued) Lobular neoplasia: Absent. Tumor focality: Unifocal. Treatment effect: None. If present, treatment effect in breast tissue, lymph nodes or both: N/A Extent of tumor: Skin: Negative for tumor. Nipple: N/A Skeletal muscle: N/A Lymph nodes: # examined: 1 Lymph nodes with metastasis: 1 Macrometastasis: (> 2.0 mm): 1.1 cm Extracapsular extension: Absent. Breast prognostic profile: Estrogen receptor: Not repeated, previous study demonstrated 100% positivity ([id number]) Progesterone receptor: Not repeated, previous study demonstrated 100% positivity ([id number]) Her 2 neu: Repeated, previous study demonstrated amplification at (2.45) ([id number]). Ki-67: Not repeated, previous study demonstrated 55% proliferation rate. Non-neoplastic breast: Previous biopsy site, vascular calcifications, benign fibrocytic change, and microcalcifications in benign ducts and lobules. TNM: pT1c, pN1a, pMX Comments: Although invasive tumor extends into cauterized tissue, there is no invasive tumor definitively identified at the cauterized edge (slides 1A-1C). (BJS:gt, 06/14/12) [place] RUND DO Pathologist, Electronic Signature (Case signed 06/14/2012) Specimen Gross and Clinical Information Specimen(s) Obtained: 1. Breast, lumpectomy, Left 2. Lymph node, sentinel, biopsy, Left Specimen Clinical Information  RADIOGRAPHIC STUDIES:   ASSESSMENT:  67 year old female with  #1 new diagnosis of 1.6 cm (by MRI) invasive ductal carcinoma grade 2 ER +100% PR +100% HER-2/neu amplified with a ratio of 2.45 with an elevated Ki-67 of 55%. Patient is  status post biopsy on 05/14/2012. Patient found a mass on self breast examination. Patient was seen by Dr. Harden Mo and he feels that the patient is a breast conservation candidate with lumpectomy and sentinel lymph node biopsy. She is now being seen for discussion of adjuvant treatment post lumpectomy.   #2 patient and I discussed her pathology in detail. We discussed the significance of grade as well as elevated Ki-67 and HER-2/neu positivity. We discussed use of HER-2 based treatment such as with Herceptin and chemotherapy. We discussed combination Herceptin and chemotherapy consisting of Taxotere carboplatinum and Herceptin. The Taxotere and carboplatinum would be given every 3 weeks for a total of 6 cycles. Herceptin would be given initially weekly with her chemotherapy and then changed to every 3 weeks to finish out one year of HER-2 therapy.  She started 07/14/12.   #  3 patient certainly will need radiation therapy and she will be seen by Dr. Doristine Devoid.   #4 because patient's tumor is estrogen receptor positive she would also received adjuvant antiestrogen therapy in the form of an aromatase inhibitors and she is postmenopausal. We discussed the different aromatase inhibitors.  #5 urge incontinence  PLAN:  #1 Ms. Doyel is doing well today.  She will proceed with Herceptin today.  She continue with radiation therapy.    #2 We will see her back in three weeks for labs, an appt, and Herceptin.    #3 She will continue to follow with urology regarding her urge incontinence.     #4 I will refer her to the lymphedema clinic for her left arm.    #5 I prescribed Optivar for her constant tearing.  She has tried Claritin, Zyrtec, Allegra, Flonase, Benadryl with no alleviation.    All questions were answered. The patient knows to call the clinic with any problems, questions or concerns. We can certainly see the patient much sooner if necessary.  I spent 25 minutes counseling the patient  face to face. The total time spent in the appointment was 30 minutes.  Cherie Ouch Lyn Hollingshead, NP Medical Oncology [facility] Phone: [phone] 12/15/2012, 11:33 AM

## 2012-12-15 NOTE — Patient Instructions (Addendum)
Doing well.  Proceed with Herceptin.  Please call us if you have any questions or concerns.    Azelastine eye solution What is this medicine? AZELASTINE (a ZEL as teen) is an antihistamine. It is used in the eye to treat itching of eyes caused by hay fever or other allergies. This medicine may be used for other purposes; ask your health care provider or pharmacist if you have questions. What should I tell my health care provider before I take this medicine? They need to know if you have any of these conditions: -wear contact lenses -an unusual or allergic reaction to azelastine, other medicines, foods, dyes, or preservatives -pregnant or trying to become pregnant -breast-feeding How should I use this medicine? This medicine is only for use in the eye. Do not take by mouth. Follow the directions on the prescription label. Wash hands before and after use. Tilt the head back slightly and pull down the lower eyelid with your index finger to form a pouch. Try not to touch the tip of the dropper to your eye, fingertips, or any other surface. Squeeze the prescribed number of drops (usually 1 drop) into the pouch. Close the eye gently. Do not blink. Use your doses at regular intervals. Do not use your medicine more often than directed. If you use other eye medicines, they should be used at least 10 minutes before or after this medicine. Eye ointments should be applied last. Talk to your pediatrician regarding the use of this medicine in children. Special care may be needed. While this drug may be prescribed for children as young as 86 years of age for selected conditions, precautions do apply. Overdosage: If you think you have taken too much of this medicine contact a poison control center or emergency room at once. NOTE: This medicine is only for you. Do not share this medicine with others. What if I miss a dose? If you miss a dose, use it as soon as you can. If it is almost time for your next dose, use  only that dose. Do not use double or extra doses. What may interact with this medicine? Interactions are not expected. Do not use any other eye products without telling your doctor or health care professional. This list may not describe all possible interactions. Give your health care provider a list of all the medicines, herbs, non-prescription drugs, or dietary supplements you use. Also tell them if you smoke, drink alcohol, or use illegal drugs. Some items may interact with your medicine. What should I watch for while using this medicine? Tell your doctor or health care professional if your symptoms do not start to get better within 2 or 3 days. Report any serious side effects promptly. Stop using this medicine if your eyes get swollen, painful, or have a discharge, and see your doctor or health care professional as soon as you can. Contact lenses may be inserted 10 minutes after putting the medicine in the eye. Do not wear contact lenses if your eyes are red. You should not use this medicine to treat irritation that is caused by contact lenses. What side effects may I notice from receiving this medicine? Side effects that you should report to your doctor or health care professional as soon as possible: -allergic reactions like skin rash, itching or hives, swelling of the face, lips, or tongue Side effects that usually do not require medical attention (report to your doctor or health care professional if they continue or are bothersome): -bitter taste -  blurred vision -eye pain, burning or stinging -fatigue -headache This list may not describe all possible side effects. Call your doctor for medical advice about side effects. You may report side effects to FDA at 1-800-FDA-1088. Where should I keep my medicine? Keep out of the reach of children. Keep container tightly closed when not in use. Store upright between 2 and 25 degrees C (36 to 77 degrees F). Throw away any unused medicine after the  expiration date. NOTE: This sheet is a summary. It may not cover all possible information. If you have questions about this medicine, talk to your doctor, pharmacist, or health care provider.  2013, [employer]vier/[employer] Standard. (08/02/2007 2:29:55 PM)

## 2012-12-16 ENCOUNTER — Ambulatory Visit
Admission: RE | Admit: 2012-12-16 | Discharge: 2012-12-16 | Disposition: A | Discharge status: Home / Self Care | Payer: BC Managed Care – PPO | Source: Ambulatory Visit | Attending: Radiation Oncology | Admitting: Radiation Oncology

## 2012-12-16 ENCOUNTER — Ambulatory Visit (HOSPITAL_BASED_OUTPATIENT_CLINIC_OR_DEPARTMENT_OTHER)
Admission: RE | Admit: 2012-12-16 | Discharge: 2012-12-16 | Disposition: A | Discharge status: Home / Self Care | Payer: BC Managed Care – PPO | Source: Ambulatory Visit | Attending: Internal Medicine | Admitting: Internal Medicine

## 2012-12-16 ENCOUNTER — Encounter (HOSPITAL_COMMUNITY): Payer: Self-pay

## 2012-12-16 ENCOUNTER — Ambulatory Visit (HOSPITAL_COMMUNITY)
Admission: RE | Admit: 2012-12-16 | Discharge: 2012-12-16 | Disposition: A | Discharge status: Home / Self Care | Payer: BC Managed Care – PPO | Source: Ambulatory Visit | Attending: Internal Medicine | Admitting: Internal Medicine

## 2012-12-16 VITALS — BP 137/58 | HR 63 | Wt 163.8 lb

## 2012-12-16 DIAGNOSIS — F329 Major depressive disorder, single episode, unspecified: Secondary | ICD-10-CM | POA: Insufficient documentation

## 2012-12-16 DIAGNOSIS — E785 Hyperlipidemia, unspecified: Secondary | ICD-10-CM | POA: Insufficient documentation

## 2012-12-16 DIAGNOSIS — C50419 Malignant neoplasm of upper-outer quadrant of unspecified female breast: Secondary | ICD-10-CM

## 2012-12-16 DIAGNOSIS — K219 Gastro-esophageal reflux disease without esophagitis: Secondary | ICD-10-CM | POA: Insufficient documentation

## 2012-12-16 DIAGNOSIS — I10 Essential (primary) hypertension: Secondary | ICD-10-CM | POA: Insufficient documentation

## 2012-12-16 DIAGNOSIS — C50919 Malignant neoplasm of unspecified site of unspecified female breast: Secondary | ICD-10-CM | POA: Insufficient documentation

## 2012-12-16 DIAGNOSIS — I517 Cardiomegaly: Secondary | ICD-10-CM

## 2012-12-16 DIAGNOSIS — Z9221 Personal history of antineoplastic chemotherapy: Secondary | ICD-10-CM | POA: Insufficient documentation

## 2012-12-16 DIAGNOSIS — Z79899 Other long term (current) drug therapy: Secondary | ICD-10-CM | POA: Insufficient documentation

## 2012-12-16 DIAGNOSIS — C50912 Malignant neoplasm of unspecified site of left female breast: Secondary | ICD-10-CM

## 2012-12-16 DIAGNOSIS — Z7982 Long term (current) use of aspirin: Secondary | ICD-10-CM | POA: Insufficient documentation

## 2012-12-16 DIAGNOSIS — F3289 Other specified depressive episodes: Secondary | ICD-10-CM | POA: Insufficient documentation

## 2012-12-16 NOTE — Progress Notes (Signed)
  Echocardiogram 2D Echocardiogram has been performed.  Arvil Chaco 12/16/2012, 10:10 AM

## 2012-12-16 NOTE — Progress Notes (Signed)
Patient ID: [name], female   DOB: 1945/10/01, 67 y.o.   MRN: [id number] Onocologist: Dr Welton Flakes General Surgeon: Dr Dwain Sarna PCP: Dr Eula Listen  HPI:  [name] is a 67 year old with PMH of depression, HTN and triple-positive breast cancer - invasive ductal carcinoma grade 2 ER +100% PR +100% HER-2/neu +.   Treatment is with combination Herceptin and chemotherapy consisting of Taxotere carboplatinum and Herceptin.  Taxotere and carboplatinum will be given every 3 weeks for a total of 46 cycles. She is getting Herceptin every 3 wks.   ECHO:  07/06/12 EF 60% Lateral S' 8.4 Pre treatment 10/04/12 EF 55-60% lat s' 8.4 => reviewed today, lateral s' 8.1; global longitudinal strain -19 (reviewed today) 12/16/12 EF 55-60%, lateral s' 7.4 with global longitudinal strain -15 (reviewed today)   She returns for follow up today.  She is doing well.  After her chemotherapy she feels bad for a couple of days but otherwise tolerating well.  She is getting Herceptin every 3 wks.  She denies dyspnea, orthopnea, PND or edema.  No chest pain.  Review of Systems: All pertinent positives and negatives as in HPI, otherwise negative.     Past Medical History  Diagnosis Date  . Hyperlipidemia   . Invasive ductal carcinoma of breast 2013    Left  . Arthritis   . Depression   . Heart murmur     heard one when she was pregnamt-maybe had echo-cannot remember  . Hypertension   . GERD (gastroesophageal reflux disease)     Current Outpatient Prescriptions  Medication Sig Dispense Refill  . Ascorbic Acid (VITAMIN C) 1000 MG tablet Take 1,000 mg by mouth daily.        Marland Kitchen aspirin 81 MG tablet Take 81 mg by mouth daily.      Marland Kitchen atenolol (TENORMIN) 50 MG tablet Take 50 mg by mouth daily.      Marland Kitchen azelastine (OPTIVAR) 0.05 % ophthalmic solution Place 1 drop into both eyes 2 (two) times daily.  6 mL  12  . Cholecalciferol (VITAMIN D) 2000 UNITS tablet Take 2,000 Units by mouth daily.      . citalopram (CELEXA) 10 MG tablet  Take 10 mg by mouth daily.       . hyaluronate sodium (RADIAPLEXRX) GEL Apply topically 2 (two) times daily. Apply bid, after rad tx and in am, afternoon tx, just not 4 h prior to rad txs      . lidocaine-prilocaine (EMLA) cream Apply 1 application topically as needed (used for chemo).      . mirabegron ER (MYRBETRIQ) 50 MG TB24 Take 50 mg by mouth daily.      . non-metallic deodorant Thornton Papas) MISC Apply 1 application topically daily.      Marland Kitchen olmesartan-hydrochlorothiazide (BENICAR HCT) 40-12.5 MG per tablet Take 1 tablet by mouth daily.      Marland Kitchen omeprazole (PRILOSEC) 40 MG capsule Take 40 mg by mouth daily.      . simvastatin (ZOCOR) 80 MG tablet Take 40 mg by mouth at bedtime.       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  1 Units  0  . vitamin E 400 UNIT capsule Take 400 Units by mouth daily.         No current facility-administered medications for this encounter.     No Known Allergies   PHYSICAL EXAM: Filed Vitals:   12/16/12 0948  BP: 137/58  Pulse: 63   General:  Well  appearing. No respiratory difficulty HEENT: normal Neck: supple. no JVD. Carotids 2+ bilat; no bruits. No lymphadenopathy or thryomegaly appreciated. Cor: PMI nondisplaced. Regular rate & rhythm. No rubs, gallops, or murmurs. Lungs: clear Abdomen: soft, nontender, nondistended. No hepatosplenomegaly. No bruits or masses. Good bowel sounds. Extremities: no cyanosis, clubbing, rash, edema Neuro: alert & oriented x 3, cranial nerves grossly intact. moves all 4 extremities w/o difficulty. Affect pleasant.  ASSESSMENT & PLAN: I reviewed [name]' echo today.  This shows stable EF but lateral s' is lower.  I looked at the 5/14 echo and obtained lateral s' of 8.1.  Today, lateral s' is 7.4 (this is a drop of 0.7).  Additionally, global longitudinal strain is less negative.  Both of these findings are concerning for increased risk of fall in LV systolic function.  I am going to repeat her  echo in 1 month after her next Herceptin dose.  If lateral s' has fallen further or global longitudinal strain is less negative, would put Herceptin on hold.   [name] 12/16/2012

## 2012-12-16 NOTE — Patient Instructions (Addendum)
Your physician recommends that you schedule a follow-up appointment in: 1 month with an echocardiogram

## 2012-12-17 ENCOUNTER — Ambulatory Visit
Admission: RE | Admit: 2012-12-17 | Discharge: 2012-12-17 | Disposition: A | Discharge status: Home / Self Care | Payer: BC Managed Care – PPO | Source: Ambulatory Visit | Attending: Radiation Oncology | Admitting: Radiation Oncology

## 2012-12-20 ENCOUNTER — Ambulatory Visit
Admission: RE | Admit: 2012-12-20 | Discharge: 2012-12-20 | Disposition: A | Discharge status: Home / Self Care | Payer: BC Managed Care – PPO | Source: Ambulatory Visit | Attending: Radiation Oncology | Admitting: Radiation Oncology

## 2012-12-20 VITALS — BP 121/51 | HR 64 | Temp 98.0°F | Ht 62.0 in | Wt 164.9 lb

## 2012-12-20 DIAGNOSIS — C50412 Malignant neoplasm of upper-outer quadrant of left female breast: Secondary | ICD-10-CM

## 2012-12-20 MED ORDER — RADIAPLEXRX EX GEL
Freq: Once | CUTANEOUS | Status: AC
  Administered 2012-12-20: 18:00:00 via TOPICAL

## 2012-12-20 NOTE — Progress Notes (Signed)
[name] here for weekly under treat visit.  She has had 15 fractions to her left breast.  She denies pain.  She has fatigue.  The skin on her right chest/breast is red.  She is using radiaplex gel twice a day.

## 2012-12-20 NOTE — Progress Notes (Signed)
   Weekly Management Note:  outpatient Current Dose:  30 Gy  Projected Dose: 60Gy   Narrative:  The patient presents for routine under treatment assessment.  CBCT/MVCT images/Port film x-rays were reviewed.  The chart was checked. Doing well. No pain. +Fatigue.  Physical Findings:  height is 5\' 2"  (1.575 m) and weight is 164 lb 14.4 oz (74.798 kg). Her temperature is 98 F (36.7 C). Her blood pressure is 121/51 and her pulse is 64.  erythema of left breast, skin intact  Impression:  The patient is tolerating radiotherapy.  Plan:  Continue radiotherapy as planned. rec'd radiaplex TID ________________________________   Lonie Peak, M.D.

## 2012-12-21 ENCOUNTER — Ambulatory Visit
Admission: RE | Admit: 2012-12-21 | Discharge: 2012-12-21 | Disposition: A | Discharge status: Home / Self Care | Payer: BC Managed Care – PPO | Source: Ambulatory Visit | Attending: Radiation Oncology | Admitting: Radiation Oncology

## 2012-12-22 ENCOUNTER — Ambulatory Visit
Admission: RE | Admit: 2012-12-22 | Discharge: 2012-12-22 | Disposition: A | Discharge status: Home / Self Care | Payer: BC Managed Care – PPO | Source: Ambulatory Visit | Attending: Radiation Oncology | Admitting: Radiation Oncology

## 2012-12-23 ENCOUNTER — Ambulatory Visit: Payer: BC Managed Care – PPO

## 2012-12-24 ENCOUNTER — Ambulatory Visit: Payer: BC Managed Care – PPO

## 2012-12-27 ENCOUNTER — Encounter: Payer: Self-pay | Admitting: Radiation Oncology

## 2012-12-27 ENCOUNTER — Ambulatory Visit
Admission: RE | Admit: 2012-12-27 | Discharge: 2012-12-27 | Disposition: A | Discharge status: Home / Self Care | Payer: BC Managed Care – PPO | Source: Ambulatory Visit | Attending: Radiation Oncology | Admitting: Radiation Oncology

## 2012-12-27 VITALS — BP 137/82 | HR 60 | Temp 97.7°F | Resp 20 | Wt 166.0 lb

## 2012-12-27 DIAGNOSIS — C50412 Malignant neoplasm of upper-outer quadrant of left female breast: Secondary | ICD-10-CM

## 2012-12-27 MED ORDER — BIAFINE EX EMUL
CUTANEOUS | Status: DC | PRN
  Administered 2012-12-27: 08:00:00 via TOPICAL

## 2012-12-27 NOTE — Progress Notes (Signed)
   Weekly Management Note: Outpatient Current Dose:  36 Gy  Projected Dose: 60 Gy   Narrative:  The patient presents for routine under treatment assessment.  CBCT/MVCT images/Port film x-rays were reviewed.  The chart was checked. Increased itching in UIQ.  Physical Findings:  weight is 166 lb (75.297 kg). Her oral temperature is 97.7 F (36.5 C). Her blood pressure is 137/82 and her pulse is 60. Her respiration is 20.  radiation dermatitis in UIQ of left breast.  Skin intact.  Impression:  The patient is tolerating radiotherapy.   Plan:  Continue radiotherapy as planned. Will switch to Biafine and try 1% hydrocortisone cream over itching area.  -----------------------------------  Lonie Peak, MD

## 2012-12-27 NOTE — Progress Notes (Signed)
Weekly rad txs 18/25 completed, left breast, dermatitis on front of chest,c/o increased itching, gave biafine cream to use in place of radiaplex gel, slight fatigue 8:15 AM

## 2012-12-28 ENCOUNTER — Ambulatory Visit
Admission: RE | Admit: 2012-12-28 | Discharge: 2012-12-28 | Disposition: A | Discharge status: Home / Self Care | Payer: BC Managed Care – PPO | Source: Ambulatory Visit | Attending: Radiation Oncology | Admitting: Radiation Oncology

## 2012-12-29 ENCOUNTER — Ambulatory Visit
Admission: RE | Admit: 2012-12-29 | Discharge: 2012-12-29 | Disposition: A | Discharge status: Home / Self Care | Payer: BC Managed Care – PPO | Source: Ambulatory Visit | Attending: Radiation Oncology | Admitting: Radiation Oncology

## 2012-12-29 ENCOUNTER — Encounter: Payer: Self-pay | Admitting: Radiation Oncology

## 2012-12-30 ENCOUNTER — Ambulatory Visit
Admission: RE | Admit: 2012-12-30 | Discharge: 2012-12-30 | Disposition: A | Discharge status: Home / Self Care | Payer: BC Managed Care – PPO | Source: Ambulatory Visit | Attending: Radiation Oncology | Admitting: Radiation Oncology

## 2012-12-31 ENCOUNTER — Ambulatory Visit
Admission: RE | Admit: 2012-12-31 | Discharge: 2012-12-31 | Disposition: A | Discharge status: Home / Self Care | Payer: BC Managed Care – PPO | Source: Ambulatory Visit | Attending: Radiation Oncology | Admitting: Radiation Oncology

## 2013-01-03 ENCOUNTER — Ambulatory Visit
Admission: RE | Admit: 2013-01-03 | Discharge: 2013-01-03 | Disposition: A | Discharge status: Home / Self Care | Payer: BC Managed Care – PPO | Source: Ambulatory Visit | Attending: Radiation Oncology | Admitting: Radiation Oncology

## 2013-01-03 ENCOUNTER — Encounter: Payer: Self-pay | Admitting: Radiation Oncology

## 2013-01-03 VITALS — BP 114/52 | HR 65 | Temp 97.8°F | Ht 62.0 in | Wt 165.0 lb

## 2013-01-03 DIAGNOSIS — C50112 Malignant neoplasm of central portion of left female breast: Secondary | ICD-10-CM

## 2013-01-03 DIAGNOSIS — C50412 Malignant neoplasm of upper-outer quadrant of left female breast: Secondary | ICD-10-CM

## 2013-01-03 MED ORDER — BIAFINE EX EMUL
CUTANEOUS | Status: DC | PRN
  Administered 2013-01-03: 18:00:00 via TOPICAL

## 2013-01-03 NOTE — Progress Notes (Signed)
   Weekly Management Note:  Outpatient Current Dose:  46 Gy  Projected Dose: 60 Gy   Narrative:  The patient presents for routine under treatment assessment.  CBCT/MVCT images/Port film x-rays were reviewed.  The chart was checked.  Doing well. Skin still itchy in UIQ, hasn't picked up hydrocortisone yet  Physical Findings:  height is 5\' 2"  (1.575 m) and weight is 165 lb (74.844 kg). Her temperature is 97.8 F (36.6 C). Her blood pressure is 114/52 and her pulse is 65.  Diffuse hyperpigmentation with early moist desquamation in inframammary fold  Impression:  The patient is tolerating radiotherapy.  Plan:  Continue radiotherapy as planned. Rec'd neosporin at inframammary fold.  ________________________________   Lonie Peak, M.D.

## 2013-01-03 NOTE — Addendum Note (Signed)
Encounter addended by: Delynn Flavin, RN on: 01/03/2013  5:31 PM<BR>     Documentation filed: Inpatient MAR

## 2013-01-03 NOTE — Progress Notes (Signed)
[name] has received 23 fractions to her left breast.   Note hyperpigmentation and erythema in tx field with rash-like appearance in the upper inner portion of the field.  Skin remains intact.  Given Biafine to use.  She reports that she feels her energy level is fine and she continues to work.

## 2013-01-04 ENCOUNTER — Ambulatory Visit: Payer: BC Managed Care – PPO

## 2013-01-04 ENCOUNTER — Ambulatory Visit
Admission: RE | Admit: 2013-01-04 | Discharge: 2013-01-04 | Disposition: A | Discharge status: Home / Self Care | Payer: BC Managed Care – PPO | Source: Ambulatory Visit | Attending: Radiation Oncology | Admitting: Radiation Oncology

## 2013-01-05 ENCOUNTER — Ambulatory Visit (HOSPITAL_BASED_OUTPATIENT_CLINIC_OR_DEPARTMENT_OTHER): Payer: BC Managed Care – PPO | Admitting: Adult Health

## 2013-01-05 ENCOUNTER — Other Ambulatory Visit: Payer: Self-pay

## 2013-01-05 ENCOUNTER — Ambulatory Visit: Payer: BC Managed Care – PPO | Admitting: Radiation Oncology

## 2013-01-05 ENCOUNTER — Encounter: Payer: Self-pay | Admitting: Adult Health

## 2013-01-05 ENCOUNTER — Other Ambulatory Visit (HOSPITAL_BASED_OUTPATIENT_CLINIC_OR_DEPARTMENT_OTHER): Payer: BC Managed Care – PPO | Admitting: Lab

## 2013-01-05 ENCOUNTER — Ambulatory Visit (HOSPITAL_BASED_OUTPATIENT_CLINIC_OR_DEPARTMENT_OTHER): Payer: BC Managed Care – PPO

## 2013-01-05 ENCOUNTER — Ambulatory Visit
Admission: RE | Admit: 2013-01-05 | Discharge: 2013-01-05 | Disposition: A | Discharge status: Home / Self Care | Payer: BC Managed Care – PPO | Source: Ambulatory Visit | Attending: Radiation Oncology | Admitting: Radiation Oncology

## 2013-01-05 VITALS — BP 115/68 | HR 71 | Temp 98.4°F | Resp 20 | Ht 62.0 in | Wt 163.6 lb

## 2013-01-05 DIAGNOSIS — Z5112 Encounter for antineoplastic immunotherapy: Secondary | ICD-10-CM

## 2013-01-05 DIAGNOSIS — N3941 Urge incontinence: Secondary | ICD-10-CM

## 2013-01-05 DIAGNOSIS — C50219 Malignant neoplasm of upper-inner quadrant of unspecified female breast: Secondary | ICD-10-CM

## 2013-01-05 DIAGNOSIS — C50912 Malignant neoplasm of unspecified site of left female breast: Secondary | ICD-10-CM

## 2013-01-05 DIAGNOSIS — Z170 Estrogen receptor positive status [ER+]: Secondary | ICD-10-CM

## 2013-01-05 DIAGNOSIS — C50412 Malignant neoplasm of upper-outer quadrant of left female breast: Secondary | ICD-10-CM

## 2013-01-05 DIAGNOSIS — C50419 Malignant neoplasm of upper-outer quadrant of unspecified female breast: Secondary | ICD-10-CM

## 2013-01-05 LAB — CBC WITH DIFFERENTIAL/PLATELET
BASO%: 0.8 % (ref 0.0–2.0)
Basophils Absolute: 0 10*3/uL (ref 0.0–0.1)
Eosinophils Absolute: 0.1 10*3/uL (ref 0.0–0.5)
HCT: 32.5 % — ABNORMAL LOW (ref 34.8–46.6)
HGB: 11.1 g/dL — ABNORMAL LOW (ref 11.6–15.9)
LYMPH%: 28 % (ref 14.0–49.7)
MCHC: 34.2 g/dL (ref 31.5–36.0)
MONO#: 0.4 10*3/uL (ref 0.1–0.9)
NEUT%: 58 % (ref 38.4–76.8)
Platelets: 154 10*3/uL (ref 145–400)
WBC: 3.5 10*3/uL — ABNORMAL LOW (ref 3.9–10.3)

## 2013-01-05 LAB — COMPREHENSIVE METABOLIC PANEL (CC13)
BUN: 29 mg/dL — ABNORMAL HIGH (ref 7.0–26.0)
CO2: 28 mEq/L (ref 22–29)
Calcium: 9.8 mg/dL (ref 8.4–10.4)
Creatinine: 1.2 mg/dL — ABNORMAL HIGH (ref 0.6–1.1)
Glucose: 91 mg/dl (ref 70–140)
Total Bilirubin: 0.69 mg/dL (ref 0.20–1.20)

## 2013-01-05 MED ORDER — ACETAMINOPHEN 325 MG PO TABS
650.0000 mg | ORAL_TABLET | Freq: Once | ORAL | Status: AC
  Administered 2013-01-05: 650 mg via ORAL

## 2013-01-05 MED ORDER — SODIUM CHLORIDE 0.9 % IJ SOLN
10.0000 mL | INTRAMUSCULAR | Status: DC | PRN
  Administered 2013-01-05: 10 mL
  Filled 2013-01-05: qty 10

## 2013-01-05 MED ORDER — SODIUM CHLORIDE 0.9 % IV SOLN: Freq: Once | INTRAVENOUS | Status: DC

## 2013-01-05 MED ORDER — DIPHENHYDRAMINE HCL 25 MG PO CAPS
50.0000 mg | ORAL_CAPSULE | Freq: Once | ORAL | Status: AC
  Administered 2013-01-05: 25 mg via ORAL

## 2013-01-05 MED ORDER — TRASTUZUMAB CHEMO INJECTION 440 MG
6.0000 mg/kg | Freq: Once | INTRAVENOUS | Status: AC
  Administered 2013-01-05: 441 mg via INTRAVENOUS
  Filled 2013-01-05: qty 21

## 2013-01-05 MED ORDER — HEPARIN SOD (PORK) LOCK FLUSH 100 UNIT/ML IV SOLN
500.0000 [IU] | Freq: Once | INTRAVENOUS | Status: AC | PRN
  Administered 2013-01-05: 500 [IU]
  Filled 2013-01-05: qty 5

## 2013-01-05 NOTE — Patient Instructions (Signed)
Doing well.  Proceed with Herceptin.  Please call me after your next appointment at Dr. Prescott Gum office.    Please call us if you have any questions or concerns.

## 2013-01-05 NOTE — Patient Instructions (Addendum)
[facility] Discharge Instructions for Patients Receiving Chemotherapy  Today you received the following chemotherapy agents Herceptin  To help prevent nausea and vomiting after your treatment, we encourage you to take your nausea medication     If you develop nausea and vomiting that is not controlled by your nausea medication, call the clinic.   BELOW ARE SYMPTOMS THAT SHOULD BE REPORTED IMMEDIATELY:  *FEVER GREATER THAN 100.5 F  *CHILLS WITH OR WITHOUT FEVER  NAUSEA AND VOMITING THAT IS NOT CONTROLLED WITH YOUR NAUSEA MEDICATION  *UNUSUAL SHORTNESS OF BREATH  *UNUSUAL BRUISING OR BLEEDING  TENDERNESS IN MOUTH AND THROAT WITH OR WITHOUT PRESENCE OF ULCERS  *URINARY PROBLEMS  *BOWEL PROBLEMS  UNUSUAL RASH Items with * indicate a potential emergency and should be followed up as soon as possible.  Feel free to call the clinic you have any questions or concerns. The clinic phone number is [phone].    

## 2013-01-05 NOTE — Progress Notes (Signed)
OFFICE PROGRESS NOTE  CCGeorgann Housekeeper, MD [address] [place] [address]  DIAGNOSIS: 67 year old female with invasive ductal carcinoma that was ER +100% PR +100% HER-2/neu positive with a ratio of 2.45. Ki-67 was 55% and elevated tumor was grade 2.  She had a lumpectomy and sentinel node biopsy revealing T1CN1A, stage IIA breast cancer of the left breast.     PRIOR THERAPY: 1. She noted a left breast mass on self breast examination. She underwent a mammogram in December that showed the mass. She went on to have it biopsied on 05/14/2012. The pathology revealed invasive ductal carcinoma that was ER +100% PR +100% HER-2/neu positive with a ratio of 2.45. Ki-67 was 55% and elevated tumor was grade 2. She had an MRI performed that showed the area to be measuring out at 1.6 cm.  2. She underwent a lumpectomy with sentinel node biopsy on 06/10/12.  revealing T1CN1A, stage IIA breast cancer of the left breast.  3. She started chemotherapy consisting of TCH on 07/14/12 with 6 cycles planned. Every 3 week Herceptin starting 11/03/12.  4. Radiation therapy starting on 11/29/12   CURRENT THERAPY:  Herceptin every 3 weeks  INTERVAL HISTORY: [name] 67 y.o. female returns for follow up today of her left breast invasive ductal carcinoma prior to her adjuvant herceptin.  She has continued to do well with radiation therapy.   She recently had an appointment with the cardiologist for her LVEF monitoring during Herceptin therapy.  There was decrease in systolic function, and she will f/u with them in one month.  She is concerned about this.  Otherwise,  a 10 point ROS is otherwise negative.   MEDICAL HISTORY: Past Medical History  Diagnosis Date  . Hyperlipidemia   . Invasive ductal carcinoma of breast 2013    Left  . Arthritis   . Depression   . Heart murmur     heard one when she was pregnamt-maybe had echo-cannot remember  . Hypertension   . GERD (gastroesophageal  reflux disease)     ALLERGIES:  has No Known Allergies.  MEDICATIONS:  Current Outpatient Prescriptions  Medication Sig Dispense Refill  . Ascorbic Acid (VITAMIN C) 1000 MG tablet Take 1,000 mg by mouth daily.        Marland Kitchen aspirin 81 MG tablet Take 81 mg by mouth daily.      Marland Kitchen atenolol (TENORMIN) 50 MG tablet Take 50 mg by mouth daily.      Marland Kitchen azelastine (OPTIVAR) 0.05 % ophthalmic solution Place 1 drop into both eyes 2 (two) times daily.  6 mL  12  . Cholecalciferol (VITAMIN D) 2000 UNITS tablet Take 2,000 Units by mouth daily.      . citalopram (CELEXA) 10 MG tablet Take 10 mg by mouth daily.       Marland Kitchen emollient (BIAFINE) cream Apply 1 application topically 2 (two) times daily.      . hyaluronate sodium (RADIAPLEXRX) GEL Apply topically 2 (two) times daily. Apply bid, after rad tx and in am, afternoon tx, just not 4 h prior to rad txs      . lidocaine-prilocaine (EMLA) cream Apply 1 application topically as needed (used for chemo).      . mirabegron ER (MYRBETRIQ) 50 MG TB24 Take 50 mg by mouth daily.      . non-metallic deodorant Thornton Papas) MISC Apply 1 application topically daily.      Marland Kitchen olmesartan-hydrochlorothiazide (BENICAR HCT) 40-12.5 MG per tablet Take 1 tablet  by mouth daily.      Marland Kitchen omeprazole (PRILOSEC) 40 MG capsule Take 40 mg by mouth daily.      . simvastatin (ZOCOR) 80 MG tablet Take 40 mg by mouth at bedtime.       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  1 Units  0  . vitamin E 400 UNIT capsule Take 400 Units by mouth daily.         No current facility-administered medications for this visit.    SURGICAL HISTORY:  Past Surgical History  Procedure Laterality Date  . Tubal ligation  1988  . Bladder suspension  10/2010  . Tonsillectomy and adenoidectomy      age 28  . Abdominal adhesion surgery  1980  . Benign breast biopsy  1994    Left  . Breast lumpectomy with needle localization and axillary sentinel lymph node bx  06/10/2012    Procedure:  BREAST LUMPECTOMY WITH NEEDLE LOCALIZATION AND AXILLARY SENTINEL LYMPH NODE BX;  Surgeon: Emelia Loron, MD;  Location: [facility];  Service: General;  Laterality: Left;  . Appendectomy    . Cholecystectomy    . Portacath placement  07/07/2012    Procedure: INSERTION PORT-A-CATH;  Surgeon: Emelia Loron, MD;  Location: [facility];  Service: General;  Laterality: Left;  Port a cath insertion    REVIEW OF SYSTEMS:    General: fatigue (-), night sweats (-), fever (-), pain (-) Lymph: palpable nodes (-) HEENT: vision changes (-), mucositis (-), gum bleeding (-), epistaxis (-) Cardiovascular: chest pain (-), palpitations (-) Pulmonary: shortness of breath (-), dyspnea on exertion (-), cough (-), hemoptysis (-) GI:  Early satiety (-), melena (-), dysphagia (-), nausea/vomiting (-), diarrhea (-) GU: dysuria (-), hematuria (-), incontinence (-) Musculoskeletal: joint swelling (-), joint pain (-), back pain (-) Neuro: weakness (-), numbness (-), headache (-), confusion (-) Skin: Rash (-), lesions (-), dryness (-) Psych: depression (-), suicidal/homicidal ideation (-), feeling of hopelessness (-)   PHYSICAL EXAMINATION: Blood pressure 115/68, pulse 71, temperature 98.4 F (36.9 C), temperature source Oral, resp. rate 20, height 5\' 2"  (1.575 m), weight 163 lb 9.6 oz (74.208 kg). Body mass index is 29.92 kg/(m^2). General: Patient is a well appearing female in no acute distress HEENT: PERRLA, sclerae anicteric no conjunctival pallor, MMM Neck: supple, no palpable adenopathy Lungs: clear to auscultation bilaterally, no wheezes, rhonchi, or rales Cardiovascular: regular rate rhythm, S1, S2,  Systolic murmur, no rubs or gallops Abdomen: Soft, non-tender, non-distended, normoactive bowel sounds, no HSM Extremities: warm and well perfused, no clubbing, cyanosis.  Scant edema in bilateral lower extremities, left arm is non-pitting, but noticeably more swollen than  right arm 2+ pulses x 4 extremities.  Skin: No rashes or lesions Neuro: Non-focal Breasts:  Left breast incision site well healed, no nodularity, right breast no masses or nodules.  Left breast erythematous with peeling in fold.    ECOG PERFORMANCE STATUS: 1 - Symptomatic but completely ambulatory   LABORATORY DATA: Lab Results  Component Value Date   WBC 3.5* 01/05/2013   HGB 11.1* 01/05/2013   HCT 32.5* 01/05/2013   MCV 99.2 01/05/2013   PLT 154 01/05/2013      Chemistry      Component Value Date/Time   NA 142 01/05/2013 1334   NA 137 08/09/2012 1935   K 4.2 01/05/2013 1334   K 4.2 08/09/2012 1935   CL 108* 11/24/2012 1043   CL 103 08/09/2012 1935  CO2 28 01/05/2013 1334   CO2 24 08/09/2012 1935   BUN 29.0* 01/05/2013 1334   BUN 48* 08/09/2012 1935   CREATININE 1.2* 01/05/2013 1334   CREATININE 1.13* 08/09/2012 1935      Component Value Date/Time   CALCIUM 9.8 01/05/2013 1334   CALCIUM 9.3 08/09/2012 1935   ALKPHOS 74 01/05/2013 1334   ALKPHOS 65 06/08/2012 0924   AST 20 01/05/2013 1334   AST 22 06/08/2012 0924   ALT 16 01/05/2013 1334   ALT 21 06/08/2012 0924   BILITOT 0.69 01/05/2013 1334   BILITOT 0.5 06/08/2012 0924     FINAL DIAGNOSIS Diagnosis 1. Breast, lumpectomy, Left - INVASIVE DUCTAL CARCINOMA (1.2CM) SEE COMMENT - LYMPHOVASCULAR INVASION IDENTIFIED. - INVASIVE TUMOR IS LESS THAN 0.1 MM FROM NEAREST MARGIN (ANTERIOR). - LYMPHOVASCULAR INVASION IDENTIFIED. - DUCTAL CARCINOMA IN SITU WITH CALCIFICATIONS AND COMEDO NECROSIS. - IN SITU CARCINOMA IS PRESENT AT ANTERIOR MARGIN. - SEE TUMOR SYNOPTIC TEMPLATE BELOW. 2. Lymph node, sentinel, biopsy, Left - ONE LYMPH NODE, POSITIVE FOR METASTATIC MAMMARY CARCINOMA (1/1) - TUMOR DEPOSIT IS 1.1CM - NO EXTRACAPSULAR TUMOR EXTENSION PRESENT. Microscopic Comment 1. BREAST, INVASIVE TUMOR, WITH LYMPH NODE SAMPLING Specimen, including laterality: Left breast. Procedure: Lumpectomy. Grade: II of III Tubule formation: 3 Nuclear pleomorphism:  2 Mitotic:1 Tumor size (gross measurement): 1.2 cm Margins: Invasive, distance to closest margin: Less than 0.1 mm, see comment. In-situ, distance to closest margin: Present at anterior margin. If margin positive, focally or broadly: Focally Lymphovascular invasion: Present. Ductal carcinoma in situ: Present Grade: III of III Extensive intraductal component: Absent. 1 of 3 FINAL for Kelli, Thomas ([id number]) Microscopic Comment(continued) Lobular neoplasia: Absent. Tumor focality: Unifocal. Treatment effect: None. If present, treatment effect in breast tissue, lymph nodes or both: N/A Extent of tumor: Skin: Negative for tumor. Nipple: N/A Skeletal muscle: N/A Lymph nodes: # examined: 1 Lymph nodes with metastasis: 1 Macrometastasis: (> 2.0 mm): 1.1 cm Extracapsular extension: Absent. Breast prognostic profile: Estrogen receptor: Not repeated, previous study demonstrated 100% positivity ([id number]) Progesterone receptor: Not repeated, previous study demonstrated 100% positivity ([id number]) Her 2 neu: Repeated, previous study demonstrated amplification at (2.45) ([id number]). Ki-67: Not repeated, previous study demonstrated 55% proliferation rate. Non-neoplastic breast: Previous biopsy site, vascular calcifications, benign fibrocytic change, and microcalcifications in benign ducts and lobules. TNM: pT1c, pN1a, pMX Comments: Although invasive tumor extends into cauterized tissue, there is no invasive tumor definitively identified at the cauterized edge (slides 1A-1C). (BJS:gt, 06/14/12) [place] RUND DO Pathologist, Electronic Signature (Case signed 06/14/2012) Specimen Gross and Clinical Information Specimen(s) Obtained: 1. Breast, lumpectomy, Left 2. Lymph node, sentinel, biopsy, Left Specimen Clinical Information  RADIOGRAPHIC STUDIES:   ASSESSMENT:  67 year old female with  #1 new diagnosis of 1.6 cm (by MRI) invasive ductal carcinoma grade 2 ER +100% PR +100%  HER-2/neu amplified with a ratio of 2.45 with an elevated Ki-67 of 55%. Patient is status post biopsy on 05/14/2012. Patient found a mass on self breast examination. Patient was seen by Dr. Harden Mo and he feels that the patient is a breast conservation candidate with lumpectomy and sentinel lymph node biopsy. She is now being seen for discussion of adjuvant treatment post lumpectomy.   #2 patient and I discussed her pathology in detail. We discussed the significance of grade as well as elevated Ki-67 and HER-2/neu positivity. We discussed use of HER-2 based treatment such as with Herceptin and chemotherapy. We discussed combination Herceptin and chemotherapy consisting of Taxotere carboplatinum and Herceptin. The Taxotere and carboplatinum would be  given every 3 weeks for a total of 6 cycles. Herceptin would be given initially weekly with her chemotherapy and then changed to every 3 weeks to finish out one year of HER-2 therapy.  She started 07/14/12.   #3 Radiation therapy 11/29/12 through   #4 because patient's tumor is estrogen receptor positive she would also received adjuvant antiestrogen therapy in the form of an aromatase inhibitors and she is postmenopausal. We discussed the different aromatase inhibitors.  #5 urge incontinence  PLAN:  #1 Ms. Fait is doing well today.  She will proceed with Herceptin today.  She continue with radiation therapy.    #2 We will see her back in three weeks for labs, an appt, and Herceptin.  She will touch base with me after her cardiology appointment.    #3 She will continue to follow with urology regarding her urge incontinence.     #4 She will see an opthalmologist about her eyes.  She is planning on waiting on this.    All questions were answered. The patient knows to call the clinic with any problems, questions or concerns. We can certainly see the patient much sooner if necessary.  I spent 25 minutes counseling the patient face to face. The  total time spent in the appointment was 30 minutes.  Cherie Ouch Lyn Hollingshead, NP Medical Oncology [facility] Phone: [phone] 01/06/2013, 10:12 PM

## 2013-01-06 ENCOUNTER — Ambulatory Visit
Admission: RE | Admit: 2013-01-06 | Discharge: 2013-01-06 | Disposition: A | Discharge status: Home / Self Care | Payer: BC Managed Care – PPO | Source: Ambulatory Visit | Attending: Radiation Oncology | Admitting: Radiation Oncology

## 2013-01-07 ENCOUNTER — Ambulatory Visit: Payer: BC Managed Care – PPO

## 2013-01-07 ENCOUNTER — Ambulatory Visit
Admission: RE | Admit: 2013-01-07 | Discharge: 2013-01-07 | Disposition: A | Discharge status: Home / Self Care | Payer: BC Managed Care – PPO | Source: Ambulatory Visit | Attending: Radiation Oncology | Admitting: Radiation Oncology

## 2013-01-10 ENCOUNTER — Ambulatory Visit: Payer: BC Managed Care – PPO

## 2013-01-10 ENCOUNTER — Ambulatory Visit
Admission: RE | Admit: 2013-01-10 | Discharge: 2013-01-10 | Disposition: A | Discharge status: Home / Self Care | Payer: BC Managed Care – PPO | Source: Ambulatory Visit | Attending: Radiation Oncology | Admitting: Radiation Oncology

## 2013-01-10 ENCOUNTER — Encounter: Payer: Self-pay | Admitting: Radiation Oncology

## 2013-01-10 VITALS — BP 97/54 | HR 70 | Resp 16 | Wt 162.8 lb

## 2013-01-10 DIAGNOSIS — C50412 Malignant neoplasm of upper-outer quadrant of left female breast: Secondary | ICD-10-CM

## 2013-01-10 NOTE — Progress Notes (Signed)
Hyperpigmentation of left breast without desquamation noted. Reports using biafine cream as directed. Encouraged patient to continue biafine use for up to two weeks following completion and she verbalized understanding. Reports mild fatigue. Blood pressure low. Denies headache, dizziness, or lightheadedness. Reports that she has eaten today and is feeling fine. Denies nausea, vomiting, diarrhea or unintentional weight loss.

## 2013-01-10 NOTE — Progress Notes (Signed)
   Weekly Management Note:  outpatient Current Dose:  56 Gy  Projected Dose: 60 Gy   Narrative:  The patient presents for routine under treatment assessment.  CBCT/MVCT images/Port film x-rays were reviewed.  The chart was checked. Doing well. No new c/o  Physical Findings:  weight is 162 lb 12.8 oz (73.846 kg). Her blood pressure is 97/54 and her pulse is 70. Her respiration is 16.  Increased hyperpigmentation over left breast. Dry desquamation in inframammary fold.  Impression:  The patient is tolerating radiotherapy.  Plan:  Continue radiotherapy as planned. F/u in 1 mo - card provided.  [facility] pamphlet to be given by my staff per my request.  ________________________________   Lonie Peak, M.D.

## 2013-01-11 ENCOUNTER — Ambulatory Visit: Payer: BC Managed Care – PPO

## 2013-01-11 ENCOUNTER — Ambulatory Visit
Admission: RE | Admit: 2013-01-11 | Discharge: 2013-01-11 | Disposition: A | Discharge status: Home / Self Care | Payer: BC Managed Care – PPO | Source: Ambulatory Visit | Attending: Radiation Oncology | Admitting: Radiation Oncology

## 2013-01-12 ENCOUNTER — Ambulatory Visit: Payer: BC Managed Care – PPO

## 2013-01-12 ENCOUNTER — Encounter: Payer: Self-pay | Admitting: Radiation Oncology

## 2013-01-12 ENCOUNTER — Ambulatory Visit
Admission: RE | Admit: 2013-01-12 | Discharge: 2013-01-12 | Disposition: A | Discharge status: Home / Self Care | Payer: BC Managed Care – PPO | Source: Ambulatory Visit | Attending: Radiation Oncology | Admitting: Radiation Oncology

## 2013-01-13 ENCOUNTER — Ambulatory Visit: Payer: BC Managed Care – PPO

## 2013-01-17 ENCOUNTER — Ambulatory Visit (HOSPITAL_COMMUNITY)
Admission: RE | Admit: 2013-01-17 | Discharge: 2013-01-17 | Disposition: A | Discharge status: Home / Self Care | Payer: BC Managed Care – PPO | Source: Ambulatory Visit | Attending: Internal Medicine | Admitting: Internal Medicine

## 2013-01-17 ENCOUNTER — Encounter (HOSPITAL_COMMUNITY): Payer: Self-pay

## 2013-01-17 ENCOUNTER — Ambulatory Visit (HOSPITAL_BASED_OUTPATIENT_CLINIC_OR_DEPARTMENT_OTHER)
Admission: RE | Admit: 2013-01-17 | Discharge: 2013-01-17 | Disposition: A | Discharge status: Home / Self Care | Payer: BC Managed Care – PPO | Source: Ambulatory Visit | Attending: Internal Medicine | Admitting: Internal Medicine

## 2013-01-17 VITALS — BP 126/70 | HR 81 | Wt 163.8 lb

## 2013-01-17 DIAGNOSIS — E785 Hyperlipidemia, unspecified: Secondary | ICD-10-CM | POA: Insufficient documentation

## 2013-01-17 DIAGNOSIS — I10 Essential (primary) hypertension: Secondary | ICD-10-CM | POA: Insufficient documentation

## 2013-01-17 DIAGNOSIS — F329 Major depressive disorder, single episode, unspecified: Secondary | ICD-10-CM | POA: Insufficient documentation

## 2013-01-17 DIAGNOSIS — C50919 Malignant neoplasm of unspecified site of unspecified female breast: Secondary | ICD-10-CM | POA: Insufficient documentation

## 2013-01-17 DIAGNOSIS — Z170 Estrogen receptor positive status [ER+]: Secondary | ICD-10-CM | POA: Insufficient documentation

## 2013-01-17 DIAGNOSIS — M129 Arthropathy, unspecified: Secondary | ICD-10-CM | POA: Insufficient documentation

## 2013-01-17 DIAGNOSIS — C50912 Malignant neoplasm of unspecified site of left female breast: Secondary | ICD-10-CM

## 2013-01-17 DIAGNOSIS — Z09 Encounter for follow-up examination after completed treatment for conditions other than malignant neoplasm: Secondary | ICD-10-CM

## 2013-01-17 DIAGNOSIS — C50419 Malignant neoplasm of upper-outer quadrant of unspecified female breast: Secondary | ICD-10-CM

## 2013-01-17 DIAGNOSIS — K219 Gastro-esophageal reflux disease without esophagitis: Secondary | ICD-10-CM | POA: Insufficient documentation

## 2013-01-17 DIAGNOSIS — F3289 Other specified depressive episodes: Secondary | ICD-10-CM | POA: Insufficient documentation

## 2013-01-17 NOTE — Progress Notes (Signed)
Patient ID: [name], female   DOB: 09/30/1945, 67 y.o.   MRN: [id number] Onocologist: Dr Welton Flakes General Surgeon: Dr Dwain Sarna PCP: Dr Eula Listen  HPI: [name] is a 67 year old with PMH of depression, HTN and triple-positive breast cancer - invasive ductal carcinoma grade 2 ER +100% PR +100% HER-2/neu +.   Treatment is with combination Herceptin and chemotherapy consisting of Taxotere carboplatinum and Herceptin.  Taxotere and carboplatinum will be given every 3 weeks for a total of 46 cycles. Herceptin would be given initially weekly with her chemotherapy and then changed to every 3 weeks to finish out one year of HER-2 therapy. Completes Herceptin in 2/15  ECHO:  07/06/12 EF 60% Lateral S' 8.4 Pre treatment 10/04/12 EF 55-60% lat s' 8.4  01/17/13 EF 60% lat s' 9.6-10.0    Follow up:  Doing pretty good. Finished radiation. Will continue Herceptin tx until February. She denies any orthopnea, SOB, or edema. Reports hair is finally growing back. BP is under good control.  Review of Systems: All pertinent positives and negatives as in HPI, otherwise negative.    Past Medical History  Diagnosis Date  . Hyperlipidemia   . Invasive ductal carcinoma of breast 2013    Left  . Arthritis   . Depression   . Heart murmur     heard one when she was pregnamt-maybe had echo-cannot remember  . Hypertension   . GERD (gastroesophageal reflux disease)     Current Outpatient Prescriptions  Medication Sig Dispense Refill  . Ascorbic Acid (VITAMIN C) 1000 MG tablet Take 1,000 mg by mouth daily.        Marland Kitchen aspirin 81 MG tablet Take 81 mg by mouth daily.      Marland Kitchen atenolol (TENORMIN) 50 MG tablet Take 50 mg by mouth daily.      Marland Kitchen azelastine (OPTIVAR) 0.05 % ophthalmic solution Place 1 drop into both eyes 2 (two) times daily.  6 mL  12  . Cholecalciferol (VITAMIN D) 2000 UNITS tablet Take 2,000 Units by mouth daily.      . citalopram (CELEXA) 10 MG tablet Take 10 mg by mouth daily.       Marland Kitchen emollient (BIAFINE)  cream Apply 1 application topically 2 (two) times daily.      . hyaluronate sodium (RADIAPLEXRX) GEL Apply topically 2 (two) times daily. Apply bid, after rad tx and in am, afternoon tx, just not 4 h prior to rad txs      . lidocaine-prilocaine (EMLA) cream Apply 1 application topically as needed (used for chemo).      . mirabegron ER (MYRBETRIQ) 50 MG TB24 Take 50 mg by mouth daily.      . non-metallic deodorant Thornton Papas) MISC Apply 1 application topically daily.      Marland Kitchen olmesartan-hydrochlorothiazide (BENICAR HCT) 20-12.5 MG per tablet Take 1 tablet by mouth daily.      Marland Kitchen omeprazole (PRILOSEC) 40 MG capsule Take 40 mg by mouth daily.      . simvastatin (ZOCOR) 80 MG tablet Take 40 mg by mouth at bedtime.       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  1 Units  0  . vitamin E 400 UNIT capsule Take 400 Units by mouth daily.         No current facility-administered medications for this encounter.   No Known Allergies  Filed Vitals:   01/17/13 0917  BP: 126/70  Pulse: 81  Weight: 163 lb 12.8 oz (74.299  kg)  SpO2: 98%   PHYSICAL EXAM: General:  Well appearing. No respiratory difficulty HEENT: normal Neck: supple. no JVD. Carotids 2+ bilat; no bruits. No lymphadenopathy or thryomegaly appreciated.  Cor: PMI nondisplaced. Regular rate & rhythm. No rubs, gallops 2/6 TR Lungs: clear Abdomen: soft, nontender, nondistended. No hepatosplenomegaly. No bruits or masses. Good bowel sounds. Extremities: no cyanosis, clubbing, rash, edema Neuro: alert & oriented x 3, cranial nerves grossly intact. moves all 4 extremities w/o difficulty. Affect pleasant.  ASSESSMENT & PLAN:  1) Cancer of upper-outer quadrant- on Herceptin  2) HTN   Patient seen and examined with Ulla Potash, NP. We discussed all aspects of the encounter. I agree with the assessment and plan as stated above.  I reviewed echos personally. EF and Doppler parameters stable. No HF on exam. Continue  Herceptin. BP looks good. Continue to follow.   Treyveon Mochizuki,MD 9:52 AM

## 2013-01-17 NOTE — Progress Notes (Signed)
Echo Lab  2D Echocardiogram completed.  Aime Carreras L Allyssa Abruzzese, RDCS 01/17/2013 7:57 AM

## 2013-01-24 NOTE — Progress Notes (Signed)
.  [facility] Radiation Oncology End of Treatment Note  Name:[name]  Date: 01/12/2013 ZOX:[id number] DOB:1946/03/17    DIAGNOSIS:  Pathologic T1c N1a M0 left breast cancer ER/PR positive HER-2/neu heterogeneous, grade 2, invasive ductal carcinoma  INDICATION FOR TREATMENT: Curative  TREATMENT DATES: 6-30 to 01-12-13                         SITE/DOSE:      1) Left Breast High Tangents / 50 Gy in 25 fractions 2) Left Breast boost / 10 Gy in 5 fractions                    BEAMS/ENERGY:   1) Tangents  With breathhold / 6 MV photons 2) Electron Boost / 12 MeV electrons                NARRATIVE:     She tolerated RT well with increased hyperpigmentation over left breast and dry desquamation in the inframammary fold.                 PLAN: Routine followup in one month. Patient instructed to call if questions or worsening complaints in interim.  -----------------------------------  Lonie Peak, MD

## 2013-01-24 NOTE — Progress Notes (Signed)
Electrom Boost Treatment Planning Note  Diagnosis: Breast Cancer  The patient's CT images from her free-breathing simulation were reviewed to plan her boost treatment to her left breast  lumpectomy cavity.  The boost to the lumpectomy cavity will be delivered with 12 MeV electrons, with an en face field, and custom electron cut out block. Special Port plan approved.  10 Gy in 5 fractions has been prescribed to the 99% isodose line.  -----------------------------------  Lonie Peak, MD

## 2013-01-26 ENCOUNTER — Telehealth: Payer: Self-pay | Admitting: Oncology

## 2013-01-26 ENCOUNTER — Ambulatory Visit (HOSPITAL_BASED_OUTPATIENT_CLINIC_OR_DEPARTMENT_OTHER): Payer: BC Managed Care – PPO | Admitting: Adult Health

## 2013-01-26 ENCOUNTER — Other Ambulatory Visit (HOSPITAL_BASED_OUTPATIENT_CLINIC_OR_DEPARTMENT_OTHER): Payer: BC Managed Care – PPO | Admitting: Lab

## 2013-01-26 ENCOUNTER — Encounter: Payer: Self-pay | Admitting: Adult Health

## 2013-01-26 ENCOUNTER — Telehealth: Payer: Self-pay | Admitting: *Deleted

## 2013-01-26 ENCOUNTER — Ambulatory Visit (HOSPITAL_BASED_OUTPATIENT_CLINIC_OR_DEPARTMENT_OTHER): Payer: BC Managed Care – PPO

## 2013-01-26 VITALS — BP 96/60 | HR 67 | Temp 98.1°F | Resp 20 | Ht 62.0 in | Wt 161.4 lb

## 2013-01-26 DIAGNOSIS — C50219 Malignant neoplasm of upper-inner quadrant of unspecified female breast: Secondary | ICD-10-CM

## 2013-01-26 DIAGNOSIS — C50419 Malignant neoplasm of upper-outer quadrant of unspecified female breast: Secondary | ICD-10-CM

## 2013-01-26 DIAGNOSIS — Z5112 Encounter for antineoplastic immunotherapy: Secondary | ICD-10-CM

## 2013-01-26 DIAGNOSIS — M899 Disorder of bone, unspecified: Secondary | ICD-10-CM

## 2013-01-26 DIAGNOSIS — C50412 Malignant neoplasm of upper-outer quadrant of left female breast: Secondary | ICD-10-CM

## 2013-01-26 DIAGNOSIS — C50912 Malignant neoplasm of unspecified site of left female breast: Secondary | ICD-10-CM

## 2013-01-26 DIAGNOSIS — C50919 Malignant neoplasm of unspecified site of unspecified female breast: Secondary | ICD-10-CM

## 2013-01-26 DIAGNOSIS — Z170 Estrogen receptor positive status [ER+]: Secondary | ICD-10-CM

## 2013-01-26 LAB — COMPREHENSIVE METABOLIC PANEL (CC13)
ALT: 23 U/L (ref 0–55)
AST: 22 U/L (ref 5–34)
Albumin: 3.8 g/dL (ref 3.5–5.0)
BUN: 24.2 mg/dL (ref 7.0–26.0)
CO2: 26 mEq/L (ref 22–29)
Calcium: 9.8 mg/dL (ref 8.4–10.4)
Chloride: 105 mEq/L (ref 98–109)
Creatinine: 1 mg/dL (ref 0.6–1.1)
Potassium: 4.1 mEq/L (ref 3.5–5.1)

## 2013-01-26 LAB — CBC WITH DIFFERENTIAL/PLATELET
Eosinophils Absolute: 0.1 10*3/uL (ref 0.0–0.5)
HCT: 33.7 % — ABNORMAL LOW (ref 34.8–46.6)
HGB: 11.3 g/dL — ABNORMAL LOW (ref 11.6–15.9)
LYMPH%: 28.3 % (ref 14.0–49.7)
MONO#: 0.3 10*3/uL (ref 0.1–0.9)
NEUT#: 2.9 10*3/uL (ref 1.5–6.5)
NEUT%: 62.3 % (ref 38.4–76.8)
Platelets: 160 10*3/uL (ref 145–400)
WBC: 4.7 10*3/uL (ref 3.9–10.3)
lymph#: 1.3 10*3/uL (ref 0.9–3.3)

## 2013-01-26 MED ORDER — LETROZOLE 2.5 MG PO TABS: 2.5000 mg | ORAL_TABLET | Freq: Every day | ORAL | Status: DC

## 2013-01-26 MED ORDER — SODIUM CHLORIDE 0.9 % IV SOLN
Freq: Once | INTRAVENOUS | Status: AC
  Administered 2013-01-26: 13:00:00 via INTRAVENOUS

## 2013-01-26 MED ORDER — SODIUM CHLORIDE 0.9 % IJ SOLN
10.0000 mL | INTRAMUSCULAR | Status: DC | PRN
Start: 2013-01-26 — End: 2013-01-26
  Administered 2013-01-26: 10 mL
  Filled 2013-01-26: qty 10

## 2013-01-26 MED ORDER — TRASTUZUMAB CHEMO INJECTION 440 MG
6.0000 mg/kg | Freq: Once | INTRAVENOUS | Status: AC
  Administered 2013-01-26: 441 mg via INTRAVENOUS
  Filled 2013-01-26: qty 21

## 2013-01-26 MED ORDER — DIPHENHYDRAMINE HCL 25 MG PO CAPS
50.0000 mg | ORAL_CAPSULE | Freq: Once | ORAL | Status: AC
  Administered 2013-01-26: 25 mg via ORAL

## 2013-01-26 MED ORDER — ACETAMINOPHEN 325 MG PO TABS
650.0000 mg | ORAL_TABLET | Freq: Once | ORAL | Status: AC
  Administered 2013-01-26: 650 mg via ORAL

## 2013-01-26 MED ORDER — HEPARIN SOD (PORK) LOCK FLUSH 100 UNIT/ML IV SOLN
500.0000 [IU] | Freq: Once | INTRAVENOUS | Status: AC | PRN
  Administered 2013-01-26: 500 [IU]
  Filled 2013-01-26: qty 5

## 2013-01-26 NOTE — Telephone Encounter (Signed)
Per staff message and POF I have scheduled appts.  JMW  

## 2013-01-26 NOTE — Progress Notes (Signed)
OFFICE PROGRESS NOTE  CCGeorgann Housekeeper, MD [address] [place] [address]  DIAGNOSIS: 67 year old female with invasive ductal carcinoma that was ER +100% PR +100% HER-2/neu positive with a ratio of 2.45. Ki-67 was 55% and elevated tumor was grade 2.  She had a lumpectomy and sentinel node biopsy revealing T1CN1A, stage IIA breast cancer of the left breast.     PRIOR THERAPY: 1. She noted a left breast mass on self breast examination. She underwent a mammogram in December that showed the mass. She went on to have it biopsied on 05/14/2012. The pathology revealed invasive ductal carcinoma that was ER +100% PR +100% HER-2/neu positive with a ratio of 2.45. Ki-67 was 55% and elevated tumor was grade 2. She had an MRI performed that showed the area to be measuring out at 1.6 cm.  2. She underwent a lumpectomy with sentinel node biopsy on 06/10/12.  revealing T1CN1A, stage IIA breast cancer of the left breast.  3. She started chemotherapy consisting of TCH on 07/14/12 with 6 cycles planned. Every 3 week Herceptin starting 11/03/12.  4. Radiation therapy starting on 11/29/12 through 01/17/13.   CURRENT THERAPY:  Herceptin every 3 weeks  INTERVAL HISTORY: [name] 67 y.o. female returns for follow up today of her left breast invasive ductal carcinoma prior to her adjuvant herceptin.  She's doing well today.  She had f/u with Dr. Gala Romney and she was cleared to continue herceptin.  She will f/u with them in 3 months.  Otherwise, a 10 point ROS is negative.    MEDICAL HISTORY: Past Medical History  Diagnosis Date  . Hyperlipidemia   . Invasive ductal carcinoma of breast 2013    Left  . Arthritis   . Depression   . Heart murmur     heard one when she was pregnamt-maybe had echo-cannot remember  . Hypertension   . GERD (gastroesophageal reflux disease)     ALLERGIES:  has No Known Allergies.  MEDICATIONS:  Current Outpatient Prescriptions  Medication Sig  Dispense Refill  . Ascorbic Acid (VITAMIN C) 1000 MG tablet Take 1,000 mg by mouth daily.        Marland Kitchen aspirin 81 MG tablet Take 81 mg by mouth daily.      Marland Kitchen atenolol (TENORMIN) 50 MG tablet Take 50 mg by mouth daily.      Marland Kitchen azelastine (OPTIVAR) 0.05 % ophthalmic solution Place 1 drop into both eyes 2 (two) times daily.  6 mL  12  . Cholecalciferol (VITAMIN D) 2000 UNITS tablet Take 2,000 Units by mouth daily.      . citalopram (CELEXA) 10 MG tablet Take 10 mg by mouth daily.       . hyaluronate sodium (RADIAPLEXRX) GEL Apply topically 2 (two) times daily. Apply bid, after rad tx and in am, afternoon tx, just not 4 h prior to rad txs      . lidocaine-prilocaine (EMLA) cream Apply 1 application topically as needed (used for chemo).      . mirabegron ER (MYRBETRIQ) 50 MG TB24 Take 50 mg by mouth daily.      . non-metallic deodorant Thornton Papas) MISC Apply 1 application topically daily.      Marland Kitchen olmesartan-hydrochlorothiazide (BENICAR HCT) 20-12.5 MG per tablet Take 1 tablet by mouth daily.      Marland Kitchen omeprazole (PRILOSEC) 40 MG capsule Take 40 mg by mouth daily.      . simvastatin (ZOCOR) 80 MG tablet Take 40 mg by mouth at  bedtime.       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  1 Units  0  . vitamin E 400 UNIT capsule Take 400 Units by mouth daily.        Marland Kitchen emollient (BIAFINE) cream Apply 1 application topically 2 (two) times daily.      Marland Kitchen letrozole (FEMARA) 2.5 MG tablet Take 1 tablet (2.5 mg total) by mouth daily.  30 tablet  6   No current facility-administered medications for this visit.    SURGICAL HISTORY:  Past Surgical History  Procedure Laterality Date  . Tubal ligation  1988  . Bladder suspension  10/2010  . Tonsillectomy and adenoidectomy      age 1  . Abdominal adhesion surgery  1980  . Benign breast biopsy  1994    Left  . Breast lumpectomy with needle localization and axillary sentinel lymph node bx  06/10/2012    Procedure: BREAST LUMPECTOMY WITH NEEDLE  LOCALIZATION AND AXILLARY SENTINEL LYMPH NODE BX;  Surgeon: Emelia Loron, MD;  Location: [facility];  Service: General;  Laterality: Left;  . Appendectomy    . Cholecystectomy    . Portacath placement  07/07/2012    Procedure: INSERTION PORT-A-CATH;  Surgeon: Emelia Loron, MD;  Location: [facility];  Service: General;  Laterality: Left;  Port a cath insertion    REVIEW OF SYSTEMS:    General: fatigue (-), night sweats (-), fever (-), pain (-) Lymph: palpable nodes (-) HEENT: vision changes (-), mucositis (-), gum bleeding (-), epistaxis (-) Cardiovascular: chest pain (-), palpitations (-) Pulmonary: shortness of breath (-), dyspnea on exertion (-), cough (-), hemoptysis (-) GI:  Early satiety (-), melena (-), dysphagia (-), nausea/vomiting (-), diarrhea (-) GU: dysuria (-), hematuria (-), incontinence (-) Musculoskeletal: joint swelling (-), joint pain (-), back pain (-) Neuro: weakness (-), numbness (-), headache (-), confusion (-) Skin: Rash (-), lesions (-), dryness (-) Psych: depression (-), suicidal/homicidal ideation (-), feeling of hopelessness (-)   PHYSICAL EXAMINATION: Blood pressure 96/60, pulse 67, temperature 98.1 F (36.7 C), temperature source Oral, resp. rate 20, height 5\' 2"  (1.575 m), weight 161 lb 6.4 oz (73.211 kg). Body mass index is 29.51 kg/(m^2). General: Patient is a well appearing female in no acute distress HEENT: PERRLA, sclerae anicteric no conjunctival pallor, MMM Neck: supple, no palpable adenopathy Lungs: clear to auscultation bilaterally, no wheezes, rhonchi, or rales Cardiovascular: regular rate rhythm, S1, S2,  Systolic murmur, no rubs or gallops Abdomen: Soft, non-tender, non-distended, normoactive bowel sounds, no HSM Extremities: warm and well perfused, no clubbing, cyanosis.  Scant edema in bilateral lower extremities, left arm is non-pitting, but noticeably more swollen than right arm 2+ pulses x 4  extremities.  Skin: No rashes or lesions Neuro: Non-focal Breasts:  Left breast incision site well healed, no nodularity, right breast no masses or nodules.  Left breast erythematous with peeling in fold.    ECOG PERFORMANCE STATUS: 1 - Symptomatic but completely ambulatory   LABORATORY DATA: Lab Results  Component Value Date   WBC 4.7 01/26/2013   HGB 11.3* 01/26/2013   HCT 33.7* 01/26/2013   MCV 95.5 01/26/2013   PLT 160 01/26/2013      Chemistry      Component Value Date/Time   NA 141 01/26/2013 1118   NA 137 08/09/2012 1935   K 4.1 01/26/2013 1118   K 4.2 08/09/2012 1935   CL 108* 11/24/2012 1043   CL 103 08/09/2012 1935  CO2 26 01/26/2013 1118   CO2 24 08/09/2012 1935   BUN 24.2 01/26/2013 1118   BUN 48* 08/09/2012 1935   CREATININE 1.0 01/26/2013 1118   CREATININE 1.13* 08/09/2012 1935      Component Value Date/Time   CALCIUM 9.8 01/26/2013 1118   CALCIUM 9.3 08/09/2012 1935   ALKPHOS 75 01/26/2013 1118   ALKPHOS 65 06/08/2012 0924   AST 22 01/26/2013 1118   AST 22 06/08/2012 0924   ALT 23 01/26/2013 1118   ALT 21 06/08/2012 0924   BILITOT 0.58 01/26/2013 1118   BILITOT 0.5 06/08/2012 0924     FINAL DIAGNOSIS Diagnosis 1. Breast, lumpectomy, Left - INVASIVE DUCTAL CARCINOMA (1.2CM) SEE COMMENT - LYMPHOVASCULAR INVASION IDENTIFIED. - INVASIVE TUMOR IS LESS THAN 0.1 MM FROM NEAREST MARGIN (ANTERIOR). - LYMPHOVASCULAR INVASION IDENTIFIED. - DUCTAL CARCINOMA IN SITU WITH CALCIFICATIONS AND COMEDO NECROSIS. - IN SITU CARCINOMA IS PRESENT AT ANTERIOR MARGIN. - SEE TUMOR SYNOPTIC TEMPLATE BELOW. 2. Lymph node, sentinel, biopsy, Left - ONE LYMPH NODE, POSITIVE FOR METASTATIC MAMMARY CARCINOMA (1/1) - TUMOR DEPOSIT IS 1.1CM - NO EXTRACAPSULAR TUMOR EXTENSION PRESENT. Microscopic Comment 1. BREAST, INVASIVE TUMOR, WITH LYMPH NODE SAMPLING Specimen, including laterality: Left breast. Procedure: Lumpectomy. Grade: II of III Tubule formation: 3 Nuclear pleomorphism: 2 Mitotic:1 Tumor  size (gross measurement): 1.2 cm Margins: Invasive, distance to closest margin: Less than 0.1 mm, see comment. In-situ, distance to closest margin: Present at anterior margin. If margin positive, focally or broadly: Focally Lymphovascular invasion: Present. Ductal carcinoma in situ: Present Grade: III of III Extensive intraductal component: Absent. 1 of 3 FINAL for [name], [name] ([id number]) Microscopic Comment(continued) Lobular neoplasia: Absent. Tumor focality: Unifocal. Treatment effect: None. If present, treatment effect in breast tissue, lymph nodes or both: N/A Extent of tumor: Skin: Negative for tumor. Nipple: N/A Skeletal muscle: N/A Lymph nodes: # examined: 1 Lymph nodes with metastasis: 1 Macrometastasis: (> 2.0 mm): 1.1 cm Extracapsular extension: Absent. Breast prognostic profile: Estrogen receptor: Not repeated, previous study demonstrated 100% positivity ([id number]) Progesterone receptor: Not repeated, previous study demonstrated 100% positivity ([id number]) Her 2 neu: Repeated, previous study demonstrated amplification at (2.45) ([id number]). Ki-67: Not repeated, previous study demonstrated 55% proliferation rate. Non-neoplastic breast: Previous biopsy site, vascular calcifications, benign fibrocytic change, and microcalcifications in benign ducts and lobules. TNM: pT1c, pN1a, pMX Comments: Although invasive tumor extends into cauterized tissue, there is no invasive tumor definitively identified at the cauterized edge (slides 1A-1C). (BJS:gt, 06/14/12) [place] RUND DO Pathologist, Electronic Signature (Case signed 06/14/2012) Specimen Gross and Clinical Information Specimen(s) Obtained: 1. Breast, lumpectomy, Left 2. Lymph node, sentinel, biopsy, Left Specimen Clinical Information  RADIOGRAPHIC STUDIES:   ASSESSMENT:  67 year old female with  #1 new diagnosis of 1.6 cm (by MRI) invasive ductal carcinoma grade 2 ER +100% PR +100% HER-2/neu amplified  with a ratio of 2.45 with an elevated Ki-67 of 55%. Patient is status post biopsy on 05/14/2012. Patient found a mass on self breast examination. Patient was seen by Dr. Harden Mo and he feels that the patient is a breast conservation candidate with lumpectomy and sentinel lymph node biopsy. She is now being seen for discussion of adjuvant treatment post lumpectomy.   #2 patient and I discussed her pathology in detail. We discussed the significance of grade as well as elevated Ki-67 and HER-2/neu positivity. We discussed use of HER-2 based treatment such as with Herceptin and chemotherapy. We discussed combination Herceptin and chemotherapy consisting of Taxotere carboplatinum and Herceptin. The Taxotere and carboplatinum would be  given every 3 weeks for a total of 6 cycles. Herceptin would be given initially weekly with her chemotherapy and then changed to every 3 weeks to finish out one year of HER-2 therapy.  She started 07/14/12.   #3 Radiation therapy 11/29/12 through 01/12/13.    #4 because patient's tumor is estrogen receptor positive she would also received adjuvant antiestrogen therapy.  Her last bone density was on 10/09/11 and indicated osteopenia in her right femur.  She will start Letrozole and take Calcium and vitamin D.  Letrozole start date-01/26/13  #5 urge incontinence  PLAN:  #1 Ms. Capili is doing well today.  She will proceed with Herceptin today.  She saw Dr. Norwood Levo on 8/18 and was cleared to continue Herceptin therapy.    #2She and I discussed anti-estrogen therapy today.  She does have a bone density that is slightly decreased.  She will start letrozole daily.  I reviewed the details of the medication with her.  I also gave her the details in her after visit summary.  We talked about the fact that she has osteopenia.  She will take Calcium, Vitamin D, and do weight bearing exercises.    #3 We will see her back in three weeks for labs, an appt, and Herceptin.    #4 She  will continue to follow with urology regarding her urge incontinence.     #5 She will see an opthalmologist about her eyes.  She is planning on waiting on this.    All questions were answered. The patient knows to call the clinic with any problems, questions or concerns. We can certainly see the patient much sooner if necessary.  I spent 25 minutes counseling the patient face to face. The total time spent in the appointment was 30 minutes.  Cherie Ouch Lyn Hollingshead, NP Medical Oncology [facility] Phone: [phone] 01/28/2013, 5:49 AM

## 2013-01-26 NOTE — Patient Instructions (Addendum)
[facility] Discharge Instructions for Patients Receiving Chemotherapy  Today you received the following chemotherapy agents:  herceptin  To help prevent nausea and vomiting after your treatment, we encourage you to take your nausea medication.  If you develop nausea and vomiting that is not controlled by your nausea medication, call the clinic.   BELOW ARE SYMPTOMS THAT SHOULD BE REPORTED IMMEDIATELY:  *FEVER GREATER THAN 100.5 F  *CHILLS WITH OR WITHOUT FEVER  NAUSEA AND VOMITING THAT IS NOT CONTROLLED WITH YOUR NAUSEA MEDICATION  *UNUSUAL SHORTNESS OF BREATH  *UNUSUAL BRUISING OR BLEEDING  TENDERNESS IN MOUTH AND THROAT WITH OR WITHOUT PRESENCE OF ULCERS  *URINARY PROBLEMS  *BOWEL PROBLEMS  UNUSUAL RASH Items with * indicate a potential emergency and should be followed up as soon as possible.  Feel free to call the clinic you have any questions or concerns. The clinic phone number is [phone].    

## 2013-01-26 NOTE — Patient Instructions (Signed)
Letrozole tablets What is this medicine? LETROZOLE (LET roe zole) blocks the production of estrogen. Certain types of breast cancer grow under the influence of estrogen. Letrozole helps block tumor growth. This medicine is used to treat advanced breast cancer in postmenopausal women. This medicine may be used for other purposes; ask your health care provider or pharmacist if you have questions. What should I tell my health care provider before I take this medicine? They need to know if you have any of these conditions: -liver disease -osteoporosis (weak bones) -an unusual or allergic reaction to letrozole, other medicines, foods, dyes, or preservatives -pregnant or trying to get pregnant -breast-feeding How should I use this medicine? Take this medicine by mouth with a glass of water. You may take it with or without food. Follow the directions on the prescription label. Take your medicine at regular intervals. Do not take your medicine more often than directed. Do not stop taking except on your doctor's advice. Talk to your pediatrician regarding the use of this medicine in children. Special care may be needed. Overdosage: If you think you have taken too much of this medicine contact a poison control center or emergency room at once. NOTE: This medicine is only for you. Do not share this medicine with others. What if I miss a dose? If you miss a dose, take it as soon as you can. If it is almost time for your next dose, take only that dose. Do not take double or extra doses. What may interact with this medicine? Do not take this medicine with any of the following medications: -estrogens, like hormone replacement therapy or birth control pills This medicine may also interact with the following medications: -dietary supplements such as androstenedione or DHEA -prasterone -tamoxifen This list may not describe all possible interactions. Give your health care provider a list of all the medicines,  herbs, non-prescription drugs, or dietary supplements you use. Also tell them if you smoke, drink alcohol, or use illegal drugs. Some items may interact with your medicine. What should I watch for while using this medicine? Visit your doctor or health care professional for regular check-ups to monitor your condition. Do not use this drug if you are pregnant. Serious side effects to an unborn child are possible. Talk to your doctor or pharmacist for more information. You may get drowsy or dizzy. Do not drive, use machinery, or do anything that needs mental alertness until you know how this medicine affects you. Do not stand or sit up quickly, especially if you are an older patient. This reduces the risk of dizzy or fainting spells. What side effects may I notice from receiving this medicine? Side effects that you should report to your doctor or health care professional as soon as possible: -allergic reactions like skin rash, itching, or hives -bone fracture -chest pain -difficulty breathing or shortness of breath -severe pain, swelling, warmth in the leg -unusually weak or tired -vaginal bleeding Side effects that usually do not require medical attention (report to your doctor or health care professional if they continue or are bothersome): -bone, back, joint, or muscle pain -dizziness -fatigue -fluid retention -headache -hot flashes, night sweats -nausea -weight gain This list may not describe all possible side effects. Call your doctor for medical advice about side effects. You may report side effects to FDA at 1-800-FDA-1088. Where should I keep my medicine? Keep out of the reach of children. Store between 15 and 30 degrees C (59 and 86 degrees F). Throw away   any unused medicine after the expiration date. NOTE: This sheet is a summary. It may not cover all possible information. If you have questions about this medicine, talk to your doctor, pharmacist, or health care provider.  2012,  Elsevier/Gold Standard. (07/30/2007 4:43:44 PM)

## 2013-01-28 ENCOUNTER — Telehealth: Payer: Self-pay | Admitting: Oncology

## 2013-01-28 NOTE — Telephone Encounter (Signed)
Due to LA out 9/17 f/u appt changed to 10:30am w/KK. S/w pt she is aware of change and new time for lb/KK/tx 9/17 @ 10am.

## 2013-02-08 ENCOUNTER — Encounter: Payer: Self-pay | Admitting: Radiation Oncology

## 2013-02-11 ENCOUNTER — Ambulatory Visit
Admission: RE | Admit: 2013-02-11 | Discharge: 2013-02-11 | Disposition: A | Discharge status: Home / Self Care | Payer: BC Managed Care – PPO | Source: Ambulatory Visit | Attending: Radiation Oncology | Admitting: Radiation Oncology

## 2013-02-11 ENCOUNTER — Encounter: Payer: Self-pay | Admitting: Radiation Oncology

## 2013-02-11 VITALS — BP 128/53 | HR 70 | Temp 97.7°F | Ht 62.0 in | Wt 162.6 lb

## 2013-02-11 DIAGNOSIS — C50412 Malignant neoplasm of upper-outer quadrant of left female breast: Secondary | ICD-10-CM

## 2013-02-11 HISTORY — DX: Personal history of antineoplastic chemotherapy: Z92.21

## 2013-02-11 HISTORY — DX: Personal history of irradiation: Z92.3

## 2013-02-11 NOTE — Progress Notes (Signed)
Radiation Oncology         [phone] ________________________________  Name: [name] MRN: [id number]  Date: 02/11/2013  DOB: Aug 10, 1945  Follow-Up Visit Note  Outpatient  CC: [name], MD  [name], MD  Diagnosis and Prior Radiotherapy:    Pathologic T1c N1a M0 left breast cancer ER/PR positive HER-2/neu heterogeneous, grade 2, invasive ductal carcinoma   INDICATION FOR TREATMENT: Curative  TREATMENT DATES: 6-30 to 01-12-13  SITE/DOSE:  1) Left Breast High Tangents / 50 Gy in 25 fractions  2) Left Breast boost / 10 Gy in 5 fractions    Narrative:  The patient returns today for routine follow-up.  Doing well. Continues on Herceptin q3wks and on Femara. Skin has healed well.                          ALLERGIES:  has No Known Allergies.  Meds: Current Outpatient Prescriptions  Medication Sig Dispense Refill  . Ascorbic Acid (VITAMIN C) 1000 MG tablet Take 1,000 mg by mouth daily.        Marland Kitchen aspirin 81 MG tablet Take 81 mg by mouth daily.      Marland Kitchen atenolol (TENORMIN) 50 MG tablet Take 50 mg by mouth daily.      Marland Kitchen azelastine (OPTIVAR) 0.05 % ophthalmic solution Place 1 drop into both eyes 2 (two) times daily.  6 mL  12  . Cholecalciferol (VITAMIN D) 2000 UNITS tablet Take 2,000 Units by mouth daily.      . citalopram (CELEXA) 10 MG tablet Take 10 mg by mouth daily.       Marland Kitchen letrozole (FEMARA) 2.5 MG tablet Take 1 tablet (2.5 mg total) by mouth daily.  30 tablet  6  . lidocaine-prilocaine (EMLA) cream Apply 1 application topically as needed (used for chemo).      Marland Kitchen olmesartan-hydrochlorothiazide (BENICAR HCT) 20-12.5 MG per tablet Take 1 tablet by mouth daily.      Marland Kitchen omeprazole (PRILOSEC) 40 MG capsule Take 40 mg by mouth daily.      . simvastatin (ZOCOR) 80 MG tablet Take 40 mg by mouth at bedtime.       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  1 Units  0  . vitamin E 400 UNIT capsule Take 400 Units by mouth daily.         No  current facility-administered medications for this encounter.    Physical Findings: The patient is in no acute distress. Patient is alert and oriented.  height is 5\' 2"  (1.575 m) and weight is 162 lb 9.6 oz (73.755 kg). Her temperature is 97.7 F (36.5 C). Her blood pressure is 128/53 and her pulse is 70. Marland Kitchen  Left breast - slightly hyperpigmented.  Skin is not dry.  Lab Findings: Lab Results  Component Value Date   WBC 4.7 01/26/2013   HGB 11.3* 01/26/2013   HCT 33.7* 01/26/2013   MCV 95.5 01/26/2013   PLT 160 01/26/2013    Radiographic Findings: No results found.  Impression/Plan: Doing well.  I recommended Vit E lotion for further healing of the skin for 1-2 months.  I encouraged her to continue followup with medical oncology. I will see her back on an as-needed basis. I have encouraged her to call if she has any issues or concerns in the future. I wished her the very best.   I spent 10 minutes face to face with the patient and more than  50% of that time was spent in counseling and/or coordination of care. _____________________________________   Lonie Peak, MD

## 2013-02-16 ENCOUNTER — Other Ambulatory Visit: Payer: BC Managed Care – PPO | Admitting: Lab

## 2013-02-16 ENCOUNTER — Ambulatory Visit: Payer: BC Managed Care – PPO

## 2013-02-16 ENCOUNTER — Ambulatory Visit: Payer: BC Managed Care – PPO | Admitting: Oncology

## 2013-02-16 ENCOUNTER — Ambulatory Visit: Payer: BC Managed Care – PPO | Admitting: Adult Health

## 2013-03-08 ENCOUNTER — Telehealth: Payer: Self-pay | Admitting: Oncology

## 2013-03-08 NOTE — Telephone Encounter (Signed)
, °

## 2013-03-09 ENCOUNTER — Encounter: Payer: Self-pay | Admitting: Family

## 2013-03-09 ENCOUNTER — Other Ambulatory Visit (HOSPITAL_BASED_OUTPATIENT_CLINIC_OR_DEPARTMENT_OTHER): Payer: BC Managed Care – PPO | Admitting: Lab

## 2013-03-09 ENCOUNTER — Ambulatory Visit: Payer: BC Managed Care – PPO | Admitting: Adult Health

## 2013-03-09 ENCOUNTER — Other Ambulatory Visit: Payer: BC Managed Care – PPO | Admitting: Lab

## 2013-03-09 ENCOUNTER — Ambulatory Visit (HOSPITAL_BASED_OUTPATIENT_CLINIC_OR_DEPARTMENT_OTHER): Payer: BC Managed Care – PPO

## 2013-03-09 ENCOUNTER — Ambulatory Visit (HOSPITAL_BASED_OUTPATIENT_CLINIC_OR_DEPARTMENT_OTHER): Payer: BC Managed Care – PPO | Admitting: Family

## 2013-03-09 VITALS — BP 102/65 | HR 69 | Temp 98.0°F | Resp 18 | Ht 62.0 in | Wt 163.6 lb

## 2013-03-09 DIAGNOSIS — C50912 Malignant neoplasm of unspecified site of left female breast: Secondary | ICD-10-CM

## 2013-03-09 DIAGNOSIS — Z5112 Encounter for antineoplastic immunotherapy: Secondary | ICD-10-CM

## 2013-03-09 DIAGNOSIS — C50219 Malignant neoplasm of upper-inner quadrant of unspecified female breast: Secondary | ICD-10-CM

## 2013-03-09 DIAGNOSIS — C50412 Malignant neoplasm of upper-outer quadrant of left female breast: Secondary | ICD-10-CM

## 2013-03-09 DIAGNOSIS — M899 Disorder of bone, unspecified: Secondary | ICD-10-CM

## 2013-03-09 DIAGNOSIS — Z170 Estrogen receptor positive status [ER+]: Secondary | ICD-10-CM

## 2013-03-09 DIAGNOSIS — C50419 Malignant neoplasm of upper-outer quadrant of unspecified female breast: Secondary | ICD-10-CM

## 2013-03-09 LAB — CBC WITH DIFFERENTIAL/PLATELET
BASO%: 0.7 % (ref 0.0–2.0)
EOS%: 1.9 % (ref 0.0–7.0)
HCT: 32.2 % — ABNORMAL LOW (ref 34.8–46.6)
HGB: 11 g/dL — ABNORMAL LOW (ref 11.6–15.9)
MCH: 32.2 pg (ref 25.1–34.0)
MCHC: 34.3 g/dL (ref 31.5–36.0)
MONO#: 0.4 10*3/uL (ref 0.1–0.9)
NEUT%: 53.6 % (ref 38.4–76.8)
RDW: 12.6 % (ref 11.2–14.5)
WBC: 4.1 10*3/uL (ref 3.9–10.3)
lymph#: 1.4 10*3/uL (ref 0.9–3.3)

## 2013-03-09 LAB — COMPREHENSIVE METABOLIC PANEL (CC13)
ALT: 15 U/L (ref 0–55)
AST: 20 U/L (ref 5–34)
Albumin: 3.8 g/dL (ref 3.5–5.0)
Anion Gap: 9 mEq/L (ref 3–11)
CO2: 27 mEq/L (ref 22–29)
Calcium: 9.7 mg/dL (ref 8.4–10.4)
Chloride: 105 mEq/L (ref 98–109)
Potassium: 4.4 mEq/L (ref 3.5–5.1)
Total Protein: 7.1 g/dL (ref 6.4–8.3)

## 2013-03-09 MED ORDER — SODIUM CHLORIDE 0.9 % IV SOLN
Freq: Once | INTRAVENOUS | Status: AC
  Administered 2013-03-09: 17:00:00 via INTRAVENOUS

## 2013-03-09 MED ORDER — DIPHENHYDRAMINE HCL 25 MG PO CAPS
50.0000 mg | ORAL_CAPSULE | Freq: Once | ORAL | Status: AC
  Administered 2013-03-09: 25 mg via ORAL

## 2013-03-09 MED ORDER — SODIUM CHLORIDE 0.9 % IJ SOLN
10.0000 mL | INTRAMUSCULAR | Status: DC | PRN
  Administered 2013-03-09: 10 mL
  Filled 2013-03-09: qty 10

## 2013-03-09 MED ORDER — HEPARIN SOD (PORK) LOCK FLUSH 100 UNIT/ML IV SOLN
500.0000 [IU] | Freq: Once | INTRAVENOUS | Status: AC | PRN
  Administered 2013-03-09: 500 [IU]
  Filled 2013-03-09: qty 5

## 2013-03-09 MED ORDER — ACETAMINOPHEN 325 MG PO TABS
650.0000 mg | ORAL_TABLET | Freq: Once | ORAL | Status: AC
  Administered 2013-03-09: 650 mg via ORAL

## 2013-03-09 MED ORDER — ACETAMINOPHEN 325 MG PO TABS
ORAL_TABLET | ORAL | Status: AC
  Filled 2013-03-09: qty 2

## 2013-03-09 MED ORDER — DIPHENHYDRAMINE HCL 25 MG PO CAPS
ORAL_CAPSULE | ORAL | Status: AC
  Filled 2013-03-09: qty 1

## 2013-03-09 MED ORDER — SODIUM CHLORIDE 0.9 % IV SOLN
6.0000 mg/kg | Freq: Once | INTRAVENOUS | Status: AC
  Administered 2013-03-09: 441 mg via INTRAVENOUS
  Filled 2013-03-09: qty 21

## 2013-03-09 NOTE — Patient Instructions (Signed)
Results for orders placed in visit on 03/09/13 (from the past 24 hour(s))  CBC WITH DIFFERENTIAL     Status: Abnormal   Collection Time    03/09/13  2:54 PM      Result Value Range   WBC 4.1  3.9 - 10.3 10e3/uL   NEUT# 2.2  1.5 - 6.5 10e3/uL   HGB 11.0 (*) 11.6 - 15.9 g/dL   HCT 29.5 (*) 62.1 - 30.8 %   Platelets 162  145 - 400 10e3/uL   MCV 93.9  79.5 - 101.0 fL   MCH 32.2  25.1 - 34.0 pg   MCHC 34.3  31.5 - 36.0 g/dL   RBC 6.57 (*) 8.46 - 9.62 10e6/uL   RDW 12.6  11.2 - 14.5 %   lymph# 1.4  0.9 - 3.3 10e3/uL   MONO# 0.4  0.1 - 0.9 10e3/uL   Eosinophils Absolute 0.1  0.0 - 0.5 10e3/uL   Basophils Absolute 0.0  0.0 - 0.1 10e3/uL   NEUT% 53.6  38.4 - 76.8 %   LYMPH% 34.9  14.0 - 49.7 %   MONO% 8.9  0.0 - 14.0 %   EOS% 1.9  0.0 - 7.0 %   BASO% 0.7  0.0 - 2.0 %   Narrative:    Performed At:  [facility]               [address]               [place], [place] [address]  COMPREHENSIVE METABOLIC PANEL (CC13)     Status: Abnormal   Collection Time    03/09/13  2:54 PM      Result Value Range   Sodium 142  136 - 145 mEq/L   Potassium 4.4  3.5 - 5.1 mEq/L   Chloride 105  98 - 109 mEq/L   CO2 27  22 - 29 mEq/L   Glucose 94  70 - 140 mg/dl   BUN 13.2  7.0 - 44.0 mg/dL   Creatinine 1.4 (*) 0.6 - 1.1 mg/dL   Total Bilirubin 1.02  0.20 - 1.20 mg/dL   Alkaline Phosphatase 76  40 - 150 U/L   AST 20  5 - 34 U/L   ALT 15  0 - 55 U/L   Total Protein 7.1  6.4 - 8.3 g/dL   Albumin 3.8  3.5 - 5.0 g/dL   Calcium 9.7  8.4 - 72.5 mg/dL   Anion Gap 9  3 - 11 mEq/L   Narrative:    Performed At:  [facility]               [address]               [place], [place] [address]

## 2013-03-09 NOTE — Patient Instructions (Signed)
[facility] Discharge Instructions for Patients Receiving Chemotherapy  Today you received the following chemotherapy agent: Herceptin   To help prevent nausea and vomiting after your treatment, we encourage you to take your nausea medication as prescribed.    If you develop nausea and vomiting that is not controlled by your nausea medication, call the clinic.   BELOW ARE SYMPTOMS THAT SHOULD BE REPORTED IMMEDIATELY:  *FEVER GREATER THAN 100.5 F  *CHILLS WITH OR WITHOUT FEVER  NAUSEA AND VOMITING THAT IS NOT CONTROLLED WITH YOUR NAUSEA MEDICATION  *UNUSUAL SHORTNESS OF BREATH  *UNUSUAL BRUISING OR BLEEDING  TENDERNESS IN MOUTH AND THROAT WITH OR WITHOUT PRESENCE OF ULCERS  *URINARY PROBLEMS  *BOWEL PROBLEMS  UNUSUAL RASH Items with * indicate a potential emergency and should be followed up as soon as possible.  Feel free to call the clinic you have any questions or concerns. The clinic phone number is [phone].    

## 2013-03-11 ENCOUNTER — Telehealth: Payer: Self-pay | Admitting: Oncology

## 2013-03-11 ENCOUNTER — Telehealth: Payer: Self-pay | Admitting: *Deleted

## 2013-03-11 NOTE — Telephone Encounter (Signed)
Per staff message and POF I have scheduled appts.  JMW  

## 2013-03-11 NOTE — Telephone Encounter (Signed)
, °

## 2013-03-12 NOTE — Progress Notes (Signed)
[facility]  Telephone:(336) [phone] Fax:([phone]  OFFICE PROGRESS NOTE   ID: [name]   DOB: 1946/05/07  MR#: [id number]  JYN#:[id number]   PCP: [name], MD   DIAGNOSIS: [name] is a 67 y.o.  female with invasive ductal carcinoma that was ER +100% PR +100% HER-2/neu positive with a ratio of 2.45. Ki-67 was 55% and elevated tumor was grade 2.  She had a lumpectomy and sentinel node biopsy revealing T1c N1a, stage IIA breast cancer of the left breast.     PRIOR THERAPY: 1. She noted a left breast mass on self breast examination. She underwent a mammogram in 05/2012 that showed the mass. She went on to have it biopsied on 05/14/2012. The pathology revealed invasive ductal carcinoma that was ER +100% PR +100% HER-2/neu positive with a ratio of 2.45. Ki-67 was 55% and elevated tumor was grade 2. She had an MRI performed that showed the area to be measuring out at 1.6 cm.  2. She underwent a lumpectomy with sentinel node biopsy on 06/10/2012.  revealing T1c N1a, stage IIA breast cancer of the left breast.  3. Received adjuvant chemotherapy consisting of TCH (Taxotere/Carboplatin/Herceptin) from 07/14/2012 through 10/27/2012 x 6 cycles.  Continuing with every 3 week Herceptin that started on 11/03/2012.  4. She had radiation therapy from 11/29/2012 through 01/17/2013.   CURRENT THERAPY:  Herceptin every 3 weeks   INTERVAL HISTORY: [name] is a 67 y.o. female who returns today for follow up of her left breast invasive ductal carcinoma.  She reports feeling well today and does not have any complaints.   Her interval history is otherwise unremarkable.   MEDICAL HISTORY: Past Medical History  Diagnosis Date  . Hyperlipidemia   . Invasive ductal carcinoma of breast 2013    Left  . Arthritis   . Depression   . Heart murmur     heard one when she was pregnamt-maybe had echo-cannot remember  . Hypertension   . GERD (gastroesophageal reflux  disease)   . Status post chemotherapy     chemotherapy consisting of TCH on 07/14/12 with 6 cycles planned. Every 3 week Herceptin starting 11/03/12.  . S/P radiation therapy  6-30 to 01-12-13                                                       6-30 to 01-12-13                                                          , Left Breast boost / 10 Gy in 5 fractions                      ALLERGIES:   No Known Allergies   MEDICATIONS:  Current Outpatient Prescriptions  Medication Sig Dispense Refill  . Ascorbic Acid (VITAMIN C) 1000 MG tablet Take 1,000 mg by mouth daily.        Marland Kitchen aspirin 81 MG tablet Take 81 mg by mouth daily.      Marland Kitchen atenolol (TENORMIN) 50 MG tablet Take 50 mg by mouth daily.      Marland Kitchen  Cholecalciferol (VITAMIN D) 2000 UNITS tablet Take 2,000 Units by mouth daily.      . citalopram (CELEXA) 10 MG tablet Take 10 mg by mouth daily.       Marland Kitchen letrozole (FEMARA) 2.5 MG tablet Take 1 tablet (2.5 mg total) by mouth daily.  30 tablet  6  . lidocaine-prilocaine (EMLA) cream Apply 1 application topically as needed (used for chemo).      Marland Kitchen olmesartan-hydrochlorothiazide (BENICAR HCT) 20-12.5 MG per tablet Take 1 tablet by mouth daily.      Marland Kitchen omeprazole (PRILOSEC) 40 MG capsule Take 40 mg by mouth daily.      . simvastatin (ZOCOR) 80 MG tablet Take 40 mg by mouth at bedtime.       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  1 Units  0  . vitamin E 400 UNIT capsule Take 400 Units by mouth daily.         No current facility-administered medications for this visit.    SURGICAL HISTORY:  Past Surgical History  Procedure Laterality Date  . Tubal ligation  1988  . Bladder suspension  10/2010  . Tonsillectomy and adenoidectomy      age 89  . Abdominal adhesion surgery  1980  . Benign breast biopsy  1994    Left  . Breast lumpectomy with needle localization and axillary sentinel lymph node bx  06/10/2012    Procedure: BREAST LUMPECTOMY WITH NEEDLE LOCALIZATION AND  AXILLARY SENTINEL LYMPH NODE BX;  Surgeon: Emelia Loron, MD;  Location: [facility];  Service: General;  Laterality: Left;  . Appendectomy    . Cholecystectomy    . Portacath placement  07/07/2012    Procedure: INSERTION PORT-A-CATH;  Surgeon: Emelia Loron, MD;  Location: [facility];  Service: General;  Laterality: Left;  Port a cath insertion    REVIEW OF SYSTEMS:    A 10 point review of systems was completed and is negative.  [name] specifically denies any symptomatology including fatigue, fever or chills, headache, vision changes, swollen glands, cough or shortness of breath, chest pain or discomfort, nausea, vomiting, diarrhea, constipation, change in urinary or bowel habits, arthralgias/myalgias, unusual bleeding/bruising or any other symptomatology.     PHYSICAL EXAMINATION: Blood pressure 102/65, pulse 69, temperature 98 F (36.7 C), temperature source Oral, resp. rate 18, height 5\' 2"  (1.575 m), weight 163 lb 9.6 oz (74.208 kg). Body mass index is 29.92 kg/(m^2).  ECOG PERFORMANCE STATUS: 0 - Asymptomatic  General appearance: Alert, cooperative, well nourished, no apparent distress Head: Normocephalic, without obvious abnormality, atraumatic, the patient is wearing a wig Eyes: Mild arcus senilis, PERRLA, EOMI Nose: Nares, septum and mucosa are normal, no drainage or sinus tenderness Neck: No adenopathy, supple, symmetrical, trachea midline, no tenderness Resp: Clear to auscultation bilaterally, no wheezes/rales/rhonchi Cardio: Regular rate and rhythm, S1, S2 normal, no murmur, click, rub or gallop, no edema, right chest Port-A-Cath covered in EMLA cream and occlusive dressing Breasts:  Deferred GI: Soft, distended, non-tender, hypoactive bowel sounds, no organomegaly Skin: No rashes/lesions, skin warm and dry, no erythematous areas, no cyanosis  M/S:  Atraumatic, normal strength in all extremities, normal range of motion, no clubbing    Lymph nodes: Cervical, supraclavicular, and axillary nodes normal Neurologic: Grossly normal, cranial nerves II through XII intact, alert and oriented x 3 Psych: Appropriate affect   LABORATORY DATA: Lab Results  Component Value Date   WBC 4.1 03/09/2013   HGB 11.0* 03/09/2013  HCT 32.2* 03/09/2013   MCV 93.9 03/09/2013   PLT 162 03/09/2013      Chemistry      Component Value Date/Time   NA 142 03/09/2013 1454   NA 137 08/09/2012 1935   K 4.4 03/09/2013 1454   K 4.2 08/09/2012 1935   CL 108* 11/24/2012 1043   CL 103 08/09/2012 1935   CO2 27 03/09/2013 1454   CO2 24 08/09/2012 1935   BUN 23.8 03/09/2013 1454   BUN 48* 08/09/2012 1935   CREATININE 1.4* 03/09/2013 1454   CREATININE 1.13* 08/09/2012 1935      Component Value Date/Time   CALCIUM 9.7 03/09/2013 1454   CALCIUM 9.3 08/09/2012 1935   ALKPHOS 76 03/09/2013 1454   ALKPHOS 65 06/08/2012 0924   AST 20 03/09/2013 1454   AST 22 06/08/2012 0924   ALT 15 03/09/2013 1454   ALT 21 06/08/2012 0924   BILITOT 0.61 03/09/2013 1454   BILITOT 0.5 06/08/2012 0924     FINAL DIAGNOSIS  RADIOGRAPHIC STUDIES: No results found.    ASSESSMENT:  BRINNLEY LACAP is a 67 y.o. female with:  #1   She noted a left breast mass on self breast examination. She underwent a mammogram in 05/2012 that showed the mass. She went on to have it biopsied on 05/14/2012. The pathology revealed invasive ductal carcinoma that was ER +100% PR +100% HER-2/neu positive with a ratio of 2.45. Ki-67 was 55% and elevated tumor was grade 2. She had an MRI performed that showed the area to be measuring out at 1.6 cm. She underwent a lumpectomy with sentinel node biopsy on 06/10/2012.  revealing T1c N1a, stage IIA breast cancer of the left breast.   #2 Received adjuvant chemotherapy consisting of TCH (Taxotere/Carboplatin/Herceptin) from 07/14/2012 through 10/27/2012 x 6 cycles.  Continuing with every 3 week Herceptin that started on 11/03/2012.  #3 She had radiation therapy from  11/29/2012 through 01/17/2013.  #4  Started adjuvant antiestrogen therapy with Letrozole on 01/26/2013.   She was also encouraged to take Calcium and vitamin D daily while taking Letrozole in addition to participating in weightbearing exercise.  Her last bone density was on 10/09/11 and indicated osteopenia in her right femur.     PLAN:  #1 [name] is doing well today.  She will proceed with Herceptin today.   Her last 2-D echocardiogram with Dr. Gala Romney on 8/18  showed a left ventricular ejection fraction of 60%.  She will is scheduled for another 2-D echocardiogram with Dr. Gala Romney in 04/2013.  #2  We will see her back in three weeks for laboratory evaluation, physical assessment and Herceptin therapy.   All questions were answered.  [name] was encouraged to contact us in the interim with any questions, concerns, or problems.     Larina Bras, NP-C 03/12/2013  6:16 PM

## 2013-03-30 ENCOUNTER — Ambulatory Visit (HOSPITAL_BASED_OUTPATIENT_CLINIC_OR_DEPARTMENT_OTHER): Payer: BC Managed Care – PPO

## 2013-03-30 ENCOUNTER — Ambulatory Visit (HOSPITAL_BASED_OUTPATIENT_CLINIC_OR_DEPARTMENT_OTHER): Payer: BC Managed Care – PPO | Admitting: Adult Health

## 2013-03-30 ENCOUNTER — Telehealth: Payer: Self-pay | Admitting: Oncology

## 2013-03-30 ENCOUNTER — Encounter: Payer: Self-pay | Admitting: Adult Health

## 2013-03-30 ENCOUNTER — Other Ambulatory Visit (HOSPITAL_BASED_OUTPATIENT_CLINIC_OR_DEPARTMENT_OTHER): Payer: BC Managed Care – PPO | Admitting: Lab

## 2013-03-30 VITALS — BP 119/75 | HR 73 | Temp 98.2°F | Resp 18 | Ht 62.0 in | Wt 160.9 lb

## 2013-03-30 DIAGNOSIS — Z170 Estrogen receptor positive status [ER+]: Secondary | ICD-10-CM

## 2013-03-30 DIAGNOSIS — C50412 Malignant neoplasm of upper-outer quadrant of left female breast: Secondary | ICD-10-CM

## 2013-03-30 DIAGNOSIS — Z5112 Encounter for antineoplastic immunotherapy: Secondary | ICD-10-CM

## 2013-03-30 DIAGNOSIS — C50219 Malignant neoplasm of upper-inner quadrant of unspecified female breast: Secondary | ICD-10-CM

## 2013-03-30 DIAGNOSIS — C50912 Malignant neoplasm of unspecified site of left female breast: Secondary | ICD-10-CM

## 2013-03-30 DIAGNOSIS — C50419 Malignant neoplasm of upper-outer quadrant of unspecified female breast: Secondary | ICD-10-CM

## 2013-03-30 LAB — COMPREHENSIVE METABOLIC PANEL (CC13)
AST: 20 U/L (ref 5–34)
Albumin: 3.7 g/dL (ref 3.5–5.0)
Alkaline Phosphatase: 67 U/L (ref 40–150)
Anion Gap: 8 mEq/L (ref 3–11)
BUN: 25.2 mg/dL (ref 7.0–26.0)
CO2: 27 mEq/L (ref 22–29)
Creatinine: 1.2 mg/dL — ABNORMAL HIGH (ref 0.6–1.1)
Glucose: 98 mg/dl (ref 70–140)
Potassium: 4.1 mEq/L (ref 3.5–5.1)
Total Bilirubin: 0.58 mg/dL (ref 0.20–1.20)
Total Protein: 7 g/dL (ref 6.4–8.3)

## 2013-03-30 LAB — CBC WITH DIFFERENTIAL/PLATELET
Basophils Absolute: 0 10*3/uL (ref 0.0–0.1)
EOS%: 2 % (ref 0.0–7.0)
Eosinophils Absolute: 0.1 10*3/uL (ref 0.0–0.5)
HCT: 32.5 % — ABNORMAL LOW (ref 34.8–46.6)
LYMPH%: 32.1 % (ref 14.0–49.7)
MCV: 92.9 fL (ref 79.5–101.0)
MONO#: 0.4 10*3/uL (ref 0.1–0.9)
MONO%: 9.3 % (ref 0.0–14.0)
NEUT#: 2.3 10*3/uL (ref 1.5–6.5)
NEUT%: 56 % (ref 38.4–76.8)
Platelets: 183 10*3/uL (ref 145–400)
RBC: 3.49 10*6/uL — ABNORMAL LOW (ref 3.70–5.45)
WBC: 4 10*3/uL (ref 3.9–10.3)

## 2013-03-30 MED ORDER — DIPHENHYDRAMINE HCL 25 MG PO CAPS
50.0000 mg | ORAL_CAPSULE | Freq: Once | ORAL | Status: AC
  Administered 2013-03-30: 25 mg via ORAL

## 2013-03-30 MED ORDER — SODIUM CHLORIDE 0.9 % IJ SOLN
10.0000 mL | INTRAMUSCULAR | Status: DC | PRN
  Administered 2013-03-30: 10 mL
  Filled 2013-03-30: qty 10

## 2013-03-30 MED ORDER — DIPHENHYDRAMINE HCL 25 MG PO CAPS
ORAL_CAPSULE | ORAL | Status: AC
  Filled 2013-03-30: qty 1

## 2013-03-30 MED ORDER — ACETAMINOPHEN 325 MG PO TABS
ORAL_TABLET | ORAL | Status: AC
  Filled 2013-03-30: qty 2

## 2013-03-30 MED ORDER — SODIUM CHLORIDE 0.9 % IV SOLN
Freq: Once | INTRAVENOUS | Status: AC
  Administered 2013-03-30: 16:00:00 via INTRAVENOUS

## 2013-03-30 MED ORDER — HEPARIN SOD (PORK) LOCK FLUSH 100 UNIT/ML IV SOLN
500.0000 [IU] | Freq: Once | INTRAVENOUS | Status: AC | PRN
  Administered 2013-03-30: 500 [IU]
  Filled 2013-03-30: qty 5

## 2013-03-30 MED ORDER — ACETAMINOPHEN 325 MG PO TABS
650.0000 mg | ORAL_TABLET | Freq: Once | ORAL | Status: AC
  Administered 2013-03-30: 650 mg via ORAL

## 2013-03-30 MED ORDER — SODIUM CHLORIDE 0.9 % IV SOLN
6.0000 mg/kg | Freq: Once | INTRAVENOUS | Status: AC
  Administered 2013-03-30: 441 mg via INTRAVENOUS
  Filled 2013-03-30: qty 21

## 2013-03-30 NOTE — Progress Notes (Signed)
[facility]  Telephone:(336) [phone] Fax:(336) [phone]  OFFICE PROGRESS NOTE   ID: [name]   DOB: [birth year]  MR#: [id number]  LOV#:[id number]   PCP: [name], MD   DIAGNOSIS: [name] is a 67 y.o.  female with invasive ductal carcinoma that was ER +100% PR +100% HER-2/neu positive with a ratio of 2.45. Ki-67 was 55% and elevated tumor was grade 2.  She had a lumpectomy and sentinel node biopsy revealing T1c N1a, stage IIA breast cancer of the left breast.     PRIOR THERAPY: 1. She noted a left breast mass on self breast examination. She underwent a mammogram in 05/2012 that showed the mass. She went on to have it biopsied on 05/14/2012. The pathology revealed invasive ductal carcinoma that was ER +100% PR +100% HER-2/neu positive with a ratio of 2.45. Ki-67 was 55% and elevated tumor was grade 2. She had an MRI performed that showed the area to be measuring out at 1.6 cm.  2. She underwent a lumpectomy with sentinel node biopsy on 06/10/2012.  revealing T1c N1a, stage IIA breast cancer of the left breast.  3. Received adjuvant chemotherapy consisting of TCH (Taxotere/Carboplatin/Herceptin) from 07/14/2012 through 10/27/2012 x 6 cycles.  Continuing with every 3 week Herceptin that started on 11/03/2012.  4. She had radiation therapy from 11/29/2012 through 01/17/2013.   CURRENT THERAPY:  Herceptin every 3 weeks   INTERVAL HISTORY: [name] is a 67 y.o. female who returns today for follow up today.  She is doing well.  She has continued to follow with urology for urge incontinence and is doing well.  She was started on medication and it is much improved.  She denies fevers, chills, orthopnea, DOE, PND, or any further concerns.  A 10 point ROS is negative.  Her last echo was on 01/17/13.     MEDICAL HISTORY: Past Medical History  Diagnosis Date  . Hyperlipidemia   . Invasive ductal carcinoma of breast 2013    Left  . Arthritis   .  Depression   . Heart murmur     heard one when she was pregnamt-maybe had echo-cannot remember  . Hypertension   . GERD (gastroesophageal reflux disease)   . Status post chemotherapy     chemotherapy consisting of TCH on 07/14/12 with 6 cycles planned. Every 3 week Herceptin starting 11/03/12.  . S/P radiation therapy  6-30 to 01-12-13                                                       6-30 to 01-12-13                                                          , Left Breast boost / 10 Gy in 5 fractions                      ALLERGIES:   No Known Allergies   MEDICATIONS:  Current Outpatient Prescriptions  Medication Sig Dispense Refill  . Ascorbic Acid (VITAMIN C) 1000 MG tablet Take 1,000 mg by mouth daily.        Marland Kitchen  aspirin 81 MG tablet Take 81 mg by mouth daily.      Marland Kitchen atenolol (TENORMIN) 50 MG tablet Take 50 mg by mouth daily.      . Cholecalciferol (VITAMIN D) 2000 UNITS tablet Take 2,000 Units by mouth daily.      . citalopram (CELEXA) 10 MG tablet Take 10 mg by mouth daily.       Marland Kitchen letrozole (FEMARA) 2.5 MG tablet Take 1 tablet (2.5 mg total) by mouth daily.  30 tablet  6  . lidocaine-prilocaine (EMLA) cream Apply 1 application topically as needed (used for chemo).      Marland Kitchen olmesartan-hydrochlorothiazide (BENICAR HCT) 20-12.5 MG per tablet Take 1 tablet by mouth daily.      Marland Kitchen omeprazole (PRILOSEC) 40 MG capsule Take 40 mg by mouth daily.      . simvastatin (ZOCOR) 80 MG tablet Take 40 mg by mouth at bedtime.       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  1 Units  0  . vitamin E 400 UNIT capsule Take 400 Units by mouth daily.         No current facility-administered medications for this visit.    SURGICAL HISTORY:  Past Surgical History  Procedure Laterality Date  . Tubal ligation  1988  . Bladder suspension  10/2010  . Tonsillectomy and adenoidectomy      age 49  . Abdominal adhesion surgery  1980  . Benign breast biopsy  1994    Left  .  Breast lumpectomy with needle localization and axillary sentinel lymph node bx  06/10/2012    Procedure: BREAST LUMPECTOMY WITH NEEDLE LOCALIZATION AND AXILLARY SENTINEL LYMPH NODE BX;  Surgeon: Emelia Loron, MD;  Location: [facility];  Service: General;  Laterality: Left;  . Appendectomy    . Cholecystectomy    . Portacath placement  07/07/2012    Procedure: INSERTION PORT-A-CATH;  Surgeon: Emelia Loron, MD;  Location: [facility];  Service: General;  Laterality: Left;  Port a cath insertion    REVIEW OF SYSTEMS:    A 10 point review of systems was completed and is negative.     PHYSICAL EXAMINATION: Blood pressure 119/75, pulse 73, temperature 98.2 F (36.8 C), temperature source Oral, resp. rate 18, height 5\' 2"  (1.575 m), weight 160 lb 14.4 oz (72.984 kg). Body mass index is 29.42 kg/(m^2). General: Patient is a well appearing female in no acute distress HEENT: PERRLA, sclerae anicteric no conjunctival pallor, MMM Neck: supple, no palpable adenopathy Lungs: clear to auscultation bilaterally, no wheezes, rhonchi, or rales Cardiovascular: regular rate rhythm, S1, S2, no murmurs, rubs or gallops Abdomen: Soft, non-tender, non-distended, normoactive bowel sounds, no HSM Extremities: warm and well perfused, no clubbing, cyanosis, or edema Skin: No rashes or lesions Neuro: Non-focal Breasts: deferred ECOG PERFORMANCE STATUS: 0 - Asymptomatic  LABORATORY DATA: Lab Results  Component Value Date   WBC 4.0 03/30/2013   HGB 11.1* 03/30/2013   HCT 32.5* 03/30/2013   MCV 92.9 03/30/2013   PLT 183 03/30/2013      Chemistry      Component Value Date/Time   NA 142 03/09/2013 1454   NA 137 08/09/2012 1935   K 4.4 03/09/2013 1454   K 4.2 08/09/2012 1935   CL 108* 11/24/2012 1043   CL 103 08/09/2012 1935   CO2 27 03/09/2013 1454   CO2 24 08/09/2012 1935   BUN 23.8 03/09/2013 1454   BUN 48* 08/09/2012 1935  CREATININE 1.4* 03/09/2013 1454   CREATININE 1.13*  08/09/2012 1935      Component Value Date/Time   CALCIUM 9.7 03/09/2013 1454   CALCIUM 9.3 08/09/2012 1935   ALKPHOS 76 03/09/2013 1454   ALKPHOS 65 06/08/2012 0924   AST 20 03/09/2013 1454   AST 22 06/08/2012 0924   ALT 15 03/09/2013 1454   ALT 21 06/08/2012 0924   BILITOT 0.61 03/09/2013 1454   BILITOT 0.5 06/08/2012 0924     FINAL DIAGNOSIS  RADIOGRAPHIC STUDIES: No results found.    ASSESSMENT:  [name] is a 66 y.o. female with:  #1   She noted a left breast mass on self breast examination. She underwent a mammogram in 05/2012 that showed the mass. She went on to have it biopsied on 05/14/2012. The pathology revealed invasive ductal carcinoma that was ER +100% PR +100% HER-2/neu positive with a ratio of 2.45. Ki-67 was 55% and elevated tumor was grade 2. She had an MRI performed that showed the area to be measuring out at 1.6 cm. She underwent a lumpectomy with sentinel node biopsy on 06/10/2012.  revealing T1c N1a, stage IIA breast cancer of the left breast.   #2 Received adjuvant chemotherapy consisting of TCH (Taxotere/Carboplatin/Herceptin) from 07/14/2012 through 10/27/2012 x 6 cycles.  Continuing with every 3 week Herceptin that started on 11/03/2012.  #3 She had radiation therapy from 11/29/2012 through 01/17/2013.  #4  Started adjuvant antiestrogen therapy with Letrozole on 01/26/2013.   She was also encouraged to take Calcium and vitamin D daily while taking Letrozole in addition to participating in weightbearing exercise.  Her last bone density was on 10/09/11 and indicated osteopenia in her right femur.     PLAN:  #1 Patient is doing well today.  She will continue daily letrozole and proceed with herceptin.  I reviewed her labs with her in detail.    #2 Her last echo was 01/17/13.  We requested f/u with Dr. Gala Romney towards the end of November.    #3 She will return in 3 weeks for her next herceptin.     All questions were answered.  Ms. Manzer was encouraged to  contact us in the interim with any questions, concerns, or problems.    I spent 25 minutes counseling the patient face to face.  The total time spent in the appointment was 30 minutes.  Illa Level, NP Medical Oncology [facility] [phone]   Illa Level, NP-C 03/30/2013  3:37 PM

## 2013-03-30 NOTE — Patient Instructions (Signed)
[facility] Discharge Instructions for Patients Receiving Chemotherapy  Today you received the following chemotherapy agents: herceptin  To help prevent nausea and vomiting after your treatment, we encourage you to take your nausea medication.  Take it as often as prescribed.     If you develop nausea and vomiting that is not controlled by your nausea medication, call the clinic. If it is after clinic hours your family physician or the after hours number for the clinic or go to the Emergency Department.   BELOW ARE SYMPTOMS THAT SHOULD BE REPORTED IMMEDIATELY:  *FEVER GREATER THAN 100.5 F  *CHILLS WITH OR WITHOUT FEVER  NAUSEA AND VOMITING THAT IS NOT CONTROLLED WITH YOUR NAUSEA MEDICATION  *UNUSUAL SHORTNESS OF BREATH  *UNUSUAL BRUISING OR BLEEDING  TENDERNESS IN MOUTH AND THROAT WITH OR WITHOUT PRESENCE OF ULCERS  *URINARY PROBLEMS  *BOWEL PROBLEMS  UNUSUAL RASH Items with * indicate a potential emergency and should be followed up as soon as possible.  Feel free to call the clinic you have any questions or concerns. The clinic phone number is [phone].   I have been informed and understand all the instructions given to me. I know to contact the clinic, my physician, or go to the Emergency Department if any problems should occur. I do not have any questions at this time, but understand that I may call the clinic during office hours   should I have any questions or need assistance in obtaining follow up care.    __________________________________________  _____________  __________ Signature of Patient or Authorized Representative            Date                   Time    __________________________________________ Nurse's Signature    

## 2013-04-07 ENCOUNTER — Other Ambulatory Visit: Payer: Self-pay

## 2013-04-20 ENCOUNTER — Ambulatory Visit (HOSPITAL_BASED_OUTPATIENT_CLINIC_OR_DEPARTMENT_OTHER): Payer: BC Managed Care – PPO | Admitting: Oncology

## 2013-04-20 ENCOUNTER — Encounter: Payer: Self-pay | Admitting: Oncology

## 2013-04-20 ENCOUNTER — Other Ambulatory Visit (HOSPITAL_BASED_OUTPATIENT_CLINIC_OR_DEPARTMENT_OTHER): Payer: BC Managed Care – PPO | Admitting: Lab

## 2013-04-20 ENCOUNTER — Ambulatory Visit (HOSPITAL_BASED_OUTPATIENT_CLINIC_OR_DEPARTMENT_OTHER): Payer: BC Managed Care – PPO

## 2013-04-20 VITALS — BP 105/46 | HR 70 | Resp 18

## 2013-04-20 VITALS — BP 83/51 | HR 76 | Temp 98.5°F | Resp 18 | Ht 62.0 in | Wt 159.8 lb

## 2013-04-20 DIAGNOSIS — M899 Disorder of bone, unspecified: Secondary | ICD-10-CM

## 2013-04-20 DIAGNOSIS — C50419 Malignant neoplasm of upper-outer quadrant of unspecified female breast: Secondary | ICD-10-CM

## 2013-04-20 DIAGNOSIS — Z170 Estrogen receptor positive status [ER+]: Secondary | ICD-10-CM

## 2013-04-20 DIAGNOSIS — C50219 Malignant neoplasm of upper-inner quadrant of unspecified female breast: Secondary | ICD-10-CM

## 2013-04-20 DIAGNOSIS — C50412 Malignant neoplasm of upper-outer quadrant of left female breast: Secondary | ICD-10-CM

## 2013-04-20 DIAGNOSIS — Z5112 Encounter for antineoplastic immunotherapy: Secondary | ICD-10-CM

## 2013-04-20 DIAGNOSIS — C50912 Malignant neoplasm of unspecified site of left female breast: Secondary | ICD-10-CM

## 2013-04-20 LAB — COMPREHENSIVE METABOLIC PANEL (CC13)
AST: 17 U/L (ref 5–34)
Alkaline Phosphatase: 76 U/L (ref 40–150)
BUN: 25.7 mg/dL (ref 7.0–26.0)
Creatinine: 1.4 mg/dL — ABNORMAL HIGH (ref 0.6–1.1)
Total Bilirubin: 1 mg/dL (ref 0.20–1.20)
Total Protein: 7.4 g/dL (ref 6.4–8.3)

## 2013-04-20 LAB — CBC WITH DIFFERENTIAL/PLATELET
BASO%: 0.6 % (ref 0.0–2.0)
Basophils Absolute: 0 10*3/uL (ref 0.0–0.1)
EOS%: 1.7 % (ref 0.0–7.0)
Eosinophils Absolute: 0.1 10*3/uL (ref 0.0–0.5)
HCT: 35 % (ref 34.8–46.6)
HGB: 11.7 g/dL (ref 11.6–15.9)
LYMPH%: 26.4 % (ref 14.0–49.7)
MCH: 31.4 pg (ref 25.1–34.0)
MCHC: 33.4 g/dL (ref 31.5–36.0)
MCV: 94 fL (ref 79.5–101.0)
MONO#: 0.8 10*3/uL (ref 0.1–0.9)
MONO%: 11.2 % (ref 0.0–14.0)
NEUT#: 4.1 10*3/uL (ref 1.5–6.5)
NEUT%: 60.1 % (ref 38.4–76.8)
Platelets: 179 10*3/uL (ref 145–400)
RBC: 3.72 10*6/uL (ref 3.70–5.45)
RDW: 13.8 % (ref 11.2–14.5)
WBC: 6.8 10*3/uL (ref 3.9–10.3)
lymph#: 1.8 10*3/uL (ref 0.9–3.3)

## 2013-04-20 MED ORDER — SODIUM CHLORIDE 0.9 % IV SOLN
Freq: Once | INTRAVENOUS | Status: AC
  Administered 2013-04-20: 15:00:00 via INTRAVENOUS

## 2013-04-20 MED ORDER — DIPHENHYDRAMINE HCL 25 MG PO CAPS
50.0000 mg | ORAL_CAPSULE | Freq: Once | ORAL | Status: AC
  Administered 2013-04-20: 25 mg via ORAL

## 2013-04-20 MED ORDER — TRASTUZUMAB CHEMO INJECTION 440 MG
6.0000 mg/kg | Freq: Once | INTRAVENOUS | Status: AC
  Administered 2013-04-20: 441 mg via INTRAVENOUS
  Filled 2013-04-20: qty 21

## 2013-04-20 MED ORDER — SODIUM CHLORIDE 0.9 % IJ SOLN
10.0000 mL | INTRAMUSCULAR | Status: DC | PRN
  Administered 2013-04-20: 10 mL
  Filled 2013-04-20: qty 10

## 2013-04-20 MED ORDER — DIPHENHYDRAMINE HCL 25 MG PO CAPS
ORAL_CAPSULE | ORAL | Status: AC
  Filled 2013-04-20: qty 1

## 2013-04-20 MED ORDER — ACETAMINOPHEN 325 MG PO TABS
ORAL_TABLET | ORAL | Status: AC
  Filled 2013-04-20: qty 2

## 2013-04-20 MED ORDER — ACETAMINOPHEN 325 MG PO TABS
650.0000 mg | ORAL_TABLET | Freq: Once | ORAL | Status: AC
  Administered 2013-04-20: 650 mg via ORAL

## 2013-04-20 MED ORDER — HEPARIN SOD (PORK) LOCK FLUSH 100 UNIT/ML IV SOLN
500.0000 [IU] | Freq: Once | INTRAVENOUS | Status: AC | PRN
  Administered 2013-04-20: 500 [IU]
  Filled 2013-04-20: qty 5

## 2013-04-20 NOTE — Progress Notes (Signed)
[facility]  Telephone:(336) [phone] Fax:(336) [phone]  OFFICE PROGRESS NOTE   ID: [name]   DOB: [birth year]  MR#: [id number]  LKG#:[id number]   PCP: [name], MD   DIAGNOSIS: [name] is a 67 y.o.  female with invasive ductal carcinoma that was ER +100% PR +100% HER-2/neu positive with a ratio of 2.45. Ki-67 was 55% and elevated tumor was grade 2.  She had a lumpectomy and sentinel node biopsy revealing T1c N1a, stage IIA breast cancer of the left breast.     PRIOR THERAPY: 1. She noted a left breast mass on self breast examination. She underwent a mammogram in 05/2012 that showed the mass. She went on to have it biopsied on 05/14/2012. The pathology revealed invasive ductal carcinoma that was ER +100% PR +100% HER-2/neu positive with a ratio of 2.45. Ki-67 was 55% and elevated tumor was grade 2. She had an MRI performed that showed the area to be measuring out at 1.6 cm.  2. She underwent a lumpectomy with sentinel node biopsy on 06/10/2012.  revealing T1c N1a, stage IIA breast cancer of the left breast.  3. Received adjuvant chemotherapy consisting of TCH (Taxotere/Carboplatin/Herceptin) from 07/14/2012 through 10/27/2012 x 6 cycles.  Continuing with every 3 week Herceptin that started on 11/03/2012.  4. She had radiation therapy from 11/29/2012 through 01/17/2013.  4. Post radiation therapy begun on letrozole 2.5 mg daily. Total of 5 years of therapy is planned   CURRENT THERAPY:  Herceptin every 3 weeks/letrozole   INTERVAL HISTORY: [name] is a 67 y.o. female who returns today for follow up today.  Overall patient is doing well. She's tolerating the letrozole very nicely without any significant problems. She is tolerating Herceptin as well no problems presented. She denies any headaches double vision blurring of vision fevers chills night sweats no shortness of breath chest pains palpitations no peripheral paresthesias no myalgias and  arthralgias. No vaginal discharge or bleeding. Remainder of the 10 point review of systems is negative.  MEDICAL HISTORY: Past Medical History  Diagnosis Date  . Hyperlipidemia   . Invasive ductal carcinoma of breast 2013    Left  . Arthritis   . Depression   . Heart murmur     heard one when she was pregnamt-maybe had echo-cannot remember  . Hypertension   . GERD (gastroesophageal reflux disease)   . Status post chemotherapy     chemotherapy consisting of TCH on 07/14/12 with 6 cycles planned. Every 3 week Herceptin starting 11/03/12.  . S/P radiation therapy  6-30 to 01-12-13                                                       6-30 to 01-12-13                                                          , Left Breast boost / 10 Gy in 5 fractions                      ALLERGIES:   No Known Allergies   MEDICATIONS:  Current Outpatient Prescriptions  Medication Sig Dispense Refill  . Ascorbic Acid (VITAMIN C) 1000 MG tablet Take 1,000 mg by mouth daily.        Marland Kitchen aspirin 81 MG tablet Take 81 mg by mouth daily.      Marland Kitchen atenolol (TENORMIN) 50 MG tablet Take 50 mg by mouth daily.      . Cholecalciferol (VITAMIN D) 2000 UNITS tablet Take 2,000 Units by mouth daily.      . citalopram (CELEXA) 10 MG tablet Take 10 mg by mouth daily.       Marland Kitchen letrozole (FEMARA) 2.5 MG tablet Take 1 tablet (2.5 mg total) by mouth daily.  30 tablet  6  . lidocaine-prilocaine (EMLA) cream Apply 1 application topically as needed (used for chemo).      Marland Kitchen olmesartan-hydrochlorothiazide (BENICAR HCT) 20-12.5 MG per tablet Take 1 tablet by mouth daily.      Marland Kitchen omeprazole (PRILOSEC) 40 MG capsule Take 40 mg by mouth daily.      . simvastatin (ZOCOR) 80 MG tablet Take 40 mg by mouth at bedtime.       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  1 Units  0  . vitamin E 400 UNIT capsule Take 400 Units by mouth daily.         No current facility-administered medications for this visit.     SURGICAL HISTORY:  Past Surgical History  Procedure Laterality Date  . Tubal ligation  1988  . Bladder suspension  10/2010  . Tonsillectomy and adenoidectomy      age 78  . Abdominal adhesion surgery  1980  . Benign breast biopsy  1994    Left  . Breast lumpectomy with needle localization and axillary sentinel lymph node bx  06/10/2012    Procedure: BREAST LUMPECTOMY WITH NEEDLE LOCALIZATION AND AXILLARY SENTINEL LYMPH NODE BX;  Surgeon: Emelia Loron, MD;  Location: [facility];  Service: General;  Laterality: Left;  . Appendectomy    . Cholecystectomy    . Portacath placement  07/07/2012    Procedure: INSERTION PORT-A-CATH;  Surgeon: Emelia Loron, MD;  Location: [facility];  Service: General;  Laterality: Left;  Port a cath insertion    REVIEW OF SYSTEMS:    A 10 point review of systems was completed and is negative.     PHYSICAL EXAMINATION: Blood pressure 83/51, pulse 76, temperature 98.5 F (36.9 C), temperature source Oral, resp. rate 18, height 5\' 2"  (1.575 m), weight 159 lb 12.8 oz (72.485 kg). Body mass index is 29.22 kg/(m^2). General: Patient is a well appearing female in no acute distress HEENT: PERRLA, sclerae anicteric no conjunctival pallor, MMM Neck: supple, no palpable adenopathy Lungs: clear to auscultation bilaterally, no wheezes, rhonchi, or rales Cardiovascular: regular rate rhythm, S1, S2, no murmurs, rubs or gallops Abdomen: Soft, non-tender, non-distended, normoactive bowel sounds, no HSM Extremities: warm and well perfused, no clubbing, cyanosis, or edema Skin: No rashes or lesions Neuro: Non-focal Breasts: deferred ECOG PERFORMANCE STATUS: 0 - Asymptomatic  LABORATORY DATA: Lab Results  Component Value Date   WBC 6.8 04/20/2013   HGB 11.7 04/20/2013   HCT 35.0 04/20/2013   MCV 94.0 04/20/2013   PLT 179 04/20/2013      Chemistry      Component Value Date/Time   NA 142 04/20/2013 1301   NA 137 08/09/2012  1935   K 4.1 04/20/2013 1301   K 4.2 08/09/2012 1935   CL 108* 11/24/2012 1043  CL 103 08/09/2012 1935   CO2 26 04/20/2013 1301   CO2 24 08/09/2012 1935   BUN 25.7 04/20/2013 1301   BUN 48* 08/09/2012 1935   CREATININE 1.4* 04/20/2013 1301   CREATININE 1.13* 08/09/2012 1935      Component Value Date/Time   CALCIUM 10.0 04/20/2013 1301   CALCIUM 9.3 08/09/2012 1935   ALKPHOS 76 04/20/2013 1301   ALKPHOS 65 06/08/2012 0924   AST 17 04/20/2013 1301   AST 22 06/08/2012 0924   ALT 13 04/20/2013 1301   ALT 21 06/08/2012 0924   BILITOT 1.00 04/20/2013 1301   BILITOT 0.5 06/08/2012 0924     FINAL DIAGNOSIS  RADIOGRAPHIC STUDIES: No results found.    ASSESSMENT:  RISSIE SCULLEY is a 67 y.o. female with:  #1   She noted a left breast mass on self breast examination. She underwent a mammogram in 05/2012 that showed the mass. She went on to have it biopsied on 05/14/2012. The pathology revealed invasive ductal carcinoma that was ER +100% PR +100% HER-2/neu positive with a ratio of 2.45. Ki-67 was 55% and elevated tumor was grade 2. She had an MRI performed that showed the area to be measuring out at 1.6 cm. She underwent a lumpectomy with sentinel node biopsy on 06/10/2012.  revealing T1c N1a, stage IIA breast cancer of the left breast.   #2 Received adjuvant chemotherapy consisting of TCH (Taxotere/Carboplatin/Herceptin) from 07/14/2012 through 10/27/2012 x 6 cycles.  Continuing with every 3 week Herceptin that started on 11/03/2012.  #3 She had radiation therapy from 11/29/2012 through 01/17/2013.  #4  Started adjuvant antiestrogen therapy with Letrozole on 01/26/2013.   She was also encouraged to take Calcium and vitamin D daily while taking Letrozole in addition to participating in weightbearing exercise.  Her last bone density was on 10/09/11 and indicated osteopenia in her right femur.     PLAN:  #1 Patient is doing well today.  She will continue daily letrozole and proceed with  herceptin.  #2 Her last echo was 01/17/13.  We requested f/u with Dr. Gala Romney towards the end of November.    #3 She will return in 3 weeks for her next herceptin.     All questions were answered.  Ms. Sassaman was encouraged to contact us in the interim with any questions, concerns, or problems.    I spent 25 minutes counseling the patient face to face.  The total time spent in the appointment was 30 minutes.  Kelli Second, MD Medical/Oncology [facility] [phone] (beeper) [phone] (Office)  04/20/2013, 2:05 PM

## 2013-04-20 NOTE — Patient Instructions (Signed)
[facility] Discharge Instructions for Patients Receiving Chemotherapy  Today you received the following chemotherapy agents Herceptin  To help prevent nausea and vomiting after your treatment, we encourage you to take your nausea medication     If you develop nausea and vomiting that is not controlled by your nausea medication, call the clinic.   BELOW ARE SYMPTOMS THAT SHOULD BE REPORTED IMMEDIATELY:  *FEVER GREATER THAN 100.5 F  *CHILLS WITH OR WITHOUT FEVER  NAUSEA AND VOMITING THAT IS NOT CONTROLLED WITH YOUR NAUSEA MEDICATION  *UNUSUAL SHORTNESS OF BREATH  *UNUSUAL BRUISING OR BLEEDING  TENDERNESS IN MOUTH AND THROAT WITH OR WITHOUT PRESENCE OF ULCERS  *URINARY PROBLEMS  *BOWEL PROBLEMS  UNUSUAL RASH Items with * indicate a potential emergency and should be followed up as soon as possible.  Feel free to call the clinic you have any questions or concerns. The clinic phone number is [phone].    

## 2013-04-27 ENCOUNTER — Telehealth (HOSPITAL_COMMUNITY): Payer: Self-pay | Admitting: Cardiology

## 2013-04-27 DIAGNOSIS — C50919 Malignant neoplasm of unspecified site of unspecified female breast: Secondary | ICD-10-CM

## 2013-04-27 NOTE — Telephone Encounter (Signed)
ORDER PLACED FOR UPCOMING ECHO 

## 2013-05-02 ENCOUNTER — Ambulatory Visit (HOSPITAL_BASED_OUTPATIENT_CLINIC_OR_DEPARTMENT_OTHER)
Admission: RE | Admit: 2013-05-02 | Discharge: 2013-05-02 | Disposition: A | Discharge status: Home / Self Care | Payer: BC Managed Care – PPO | Source: Ambulatory Visit | Attending: Internal Medicine | Admitting: Internal Medicine

## 2013-05-02 ENCOUNTER — Ambulatory Visit (HOSPITAL_COMMUNITY)
Admission: RE | Admit: 2013-05-02 | Discharge: 2013-05-02 | Disposition: A | Discharge status: Home / Self Care | Payer: BC Managed Care – PPO | Source: Ambulatory Visit | Attending: Radiation Oncology | Admitting: Radiation Oncology

## 2013-05-02 VITALS — BP 134/72 | HR 65 | Wt 160.5 lb

## 2013-05-02 DIAGNOSIS — I359 Nonrheumatic aortic valve disorder, unspecified: Secondary | ICD-10-CM | POA: Insufficient documentation

## 2013-05-02 DIAGNOSIS — C50419 Malignant neoplasm of upper-outer quadrant of unspecified female breast: Secondary | ICD-10-CM | POA: Insufficient documentation

## 2013-05-02 DIAGNOSIS — I059 Rheumatic mitral valve disease, unspecified: Secondary | ICD-10-CM | POA: Insufficient documentation

## 2013-05-02 DIAGNOSIS — I079 Rheumatic tricuspid valve disease, unspecified: Secondary | ICD-10-CM | POA: Insufficient documentation

## 2013-05-02 DIAGNOSIS — Z09 Encounter for follow-up examination after completed treatment for conditions other than malignant neoplasm: Secondary | ICD-10-CM

## 2013-05-02 DIAGNOSIS — I10 Essential (primary) hypertension: Secondary | ICD-10-CM

## 2013-05-02 DIAGNOSIS — C50919 Malignant neoplasm of unspecified site of unspecified female breast: Secondary | ICD-10-CM

## 2013-05-02 NOTE — Progress Notes (Signed)
Patient ID: [name], female   DOB: Oct 25, 1945, 67 y.o.   MRN: [id number] Onocologist: Dr Welton Flakes General Surgeon: Dr Dwain Sarna PCP: Dr Eula Listen  HPI: [name] is a 67 year old with PMH of depression, HTN and triple-positive breast cancer - invasive ductal carcinoma grade 2 ER +100% PR +100% HER-2/neu +.   Treatment is with combination Herceptin and chemotherapy consisting of Taxotere carboplatinum and Herceptin.  Taxotere and carboplatinum will be given every 3 weeks for a total of 46 cycles. Herceptin would be given initially weekly with her chemotherapy and then changed to every 3 weeks to finish out one year of HER-2 therapy. Completes Herceptin in 3/15  ECHO:  07/06/12 EF 60% Lateral S' 8.4 Pre treatment 10/04/12 EF 55-60% lat s' 8.4  01/17/13 EF 60% lat s' 9.6-10.0  05/02/13 EF   Follow up: Doing great. Denies orthopnea, SOB, CP or edema. Boyfriend currently in the hospital for MI. Will finish Herceptin tx in early March 2015.   Review of Systems: All pertinent positives and negatives as in HPI, otherwise negative.    Past Medical History  Diagnosis Date  . Hyperlipidemia   . Invasive ductal carcinoma of breast 2013    Left  . Arthritis   . Depression   . Heart murmur     heard one when she was pregnamt-maybe had echo-cannot remember  . Hypertension   . GERD (gastroesophageal reflux disease)   . Status post chemotherapy     chemotherapy consisting of TCH on 07/14/12 with 6 cycles planned. Every 3 week Herceptin starting 11/03/12.  . S/P radiation therapy  6-30 to 01-12-13                                                       6-30 to 01-12-13                                                          , Left Breast boost / 10 Gy in 5 fractions                      Current Outpatient Prescriptions  Medication Sig Dispense Refill  . Ascorbic Acid (VITAMIN C) 1000 MG tablet Take 1,000 mg by mouth daily.        Marland Kitchen aspirin 81 MG tablet Take 81 mg by mouth daily.      Marland Kitchen atenolol (TENORMIN) 50  MG tablet Take 50 mg by mouth daily.      . Cholecalciferol (VITAMIN D) 2000 UNITS tablet Take 2,000 Units by mouth daily.      . citalopram (CELEXA) 10 MG tablet Take 10 mg by mouth daily.       Marland Kitchen letrozole (FEMARA) 2.5 MG tablet Take 1 tablet (2.5 mg total) by mouth daily.  30 tablet  6  . lidocaine-prilocaine (EMLA) cream Apply 1 application topically as needed (used for chemo).      Marland Kitchen olmesartan-hydrochlorothiazide (BENICAR HCT) 20-12.5 MG per tablet Take 1 tablet by mouth daily.      Marland Kitchen omeprazole (PRILOSEC) 40 MG capsule Take 40 mg by mouth daily.      . simvastatin (  ZOCOR) 80 MG tablet Take 40 mg by mouth at bedtime.       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  1 Units  0  . vitamin E 400 UNIT capsule Take 400 Units by mouth daily.         No current facility-administered medications for this encounter.   No Known Allergies  Filed Vitals:   05/02/13 1012  BP: 134/72  Pulse: 65  Weight: 160 lb 8 oz (72.802 kg)  SpO2: 100%   PHYSICAL EXAM: General:  Well appearing. No respiratory difficulty HEENT: normal Neck: supple. no JVD. Carotids 2+ bilat; no bruits. No lymphadenopathy or thryomegaly appreciated.  Cor: PMI nondisplaced. Regular rate & rhythm. No rubs, gallops 2/6 TR Lungs: clear Abdomen: soft, nontender, nondistended. No hepatosplenomegaly. No bruits or masses. Good bowel sounds. Extremities: no cyanosis, clubbing, rash, edema Neuro: alert & oriented x 3, cranial nerves grossly intact. moves all 4 extremities w/o difficulty. Affect pleasant.  ASSESSMENT & PLAN:  1) Cancer of upper-outer quadrant- on Herceptin  - ECHO today: EF 55-60%, lateral s' 10.0, global strain -22.5%. All parameters stable. Will continue Herceptin and follow up in 3 months with ECHO. She completes treatment in 07/2013. 2) HTN - Stable on benicar and atenolol.   Aundria Rud, NP-C 10:22 AM   Patient seen and examined with Ulla Potash, NP. We discussed all  aspects of the encounter. I agree with the assessment and plan as stated above.   I reviewed echos personally. EF and Doppler parameters stable. No HF on exam. Continue Herceptin. BP well controlled.    Bertrand Vowels,MD 9:01 PM

## 2013-05-02 NOTE — Progress Notes (Signed)
*  PRELIMINARY RESULTS* Echocardiogram 2D Echocardiogram has been performed.  [name] 05/02/2013, 9:59 AM

## 2013-05-11 ENCOUNTER — Ambulatory Visit (HOSPITAL_BASED_OUTPATIENT_CLINIC_OR_DEPARTMENT_OTHER): Payer: BC Managed Care – PPO | Admitting: Adult Health

## 2013-05-11 ENCOUNTER — Telehealth: Payer: Self-pay | Admitting: *Deleted

## 2013-05-11 ENCOUNTER — Encounter: Payer: Self-pay | Admitting: Adult Health

## 2013-05-11 ENCOUNTER — Ambulatory Visit (HOSPITAL_BASED_OUTPATIENT_CLINIC_OR_DEPARTMENT_OTHER): Payer: BC Managed Care – PPO

## 2013-05-11 ENCOUNTER — Other Ambulatory Visit: Payer: Self-pay | Admitting: *Deleted

## 2013-05-11 ENCOUNTER — Other Ambulatory Visit (HOSPITAL_BASED_OUTPATIENT_CLINIC_OR_DEPARTMENT_OTHER): Payer: BC Managed Care – PPO

## 2013-05-11 VITALS — BP 122/66 | HR 62 | Temp 97.8°F | Resp 18 | Ht 62.0 in | Wt 161.6 lb

## 2013-05-11 DIAGNOSIS — C50419 Malignant neoplasm of upper-outer quadrant of unspecified female breast: Secondary | ICD-10-CM

## 2013-05-11 DIAGNOSIS — M899 Disorder of bone, unspecified: Secondary | ICD-10-CM

## 2013-05-11 DIAGNOSIS — C50219 Malignant neoplasm of upper-inner quadrant of unspecified female breast: Secondary | ICD-10-CM

## 2013-05-11 DIAGNOSIS — C50912 Malignant neoplasm of unspecified site of left female breast: Secondary | ICD-10-CM

## 2013-05-11 DIAGNOSIS — Z5112 Encounter for antineoplastic immunotherapy: Secondary | ICD-10-CM

## 2013-05-11 DIAGNOSIS — Z170 Estrogen receptor positive status [ER+]: Secondary | ICD-10-CM

## 2013-05-11 DIAGNOSIS — C50412 Malignant neoplasm of upper-outer quadrant of left female breast: Secondary | ICD-10-CM

## 2013-05-11 LAB — CBC WITH DIFFERENTIAL/PLATELET
Basophils Absolute: 0 10*3/uL (ref 0.0–0.1)
EOS%: 1.7 % (ref 0.0–7.0)
HCT: 32.7 % — ABNORMAL LOW (ref 34.8–46.6)
HGB: 10.8 g/dL — ABNORMAL LOW (ref 11.6–15.9)
MCH: 30.6 pg (ref 25.1–34.0)
MCV: 92.6 fL (ref 79.5–101.0)
MONO%: 7.8 % (ref 0.0–14.0)
NEUT#: 2.4 10*3/uL (ref 1.5–6.5)
NEUT%: 51.2 % (ref 38.4–76.8)
lymph#: 1.8 10*3/uL (ref 0.9–3.3)

## 2013-05-11 LAB — COMPREHENSIVE METABOLIC PANEL (CC13)
Albumin: 3.9 g/dL (ref 3.5–5.0)
Alkaline Phosphatase: 69 U/L (ref 40–150)
Anion Gap: 9 mEq/L (ref 3–11)
BUN: 25.1 mg/dL (ref 7.0–26.0)
Chloride: 108 mEq/L (ref 98–109)
Glucose: 89 mg/dl (ref 70–140)
Potassium: 4.2 mEq/L (ref 3.5–5.1)
Total Bilirubin: 0.48 mg/dL (ref 0.20–1.20)

## 2013-05-11 MED ORDER — SODIUM CHLORIDE 0.9 % IJ SOLN
10.0000 mL | INTRAMUSCULAR | Status: DC | PRN
  Administered 2013-05-11: 10 mL
  Filled 2013-05-11: qty 10

## 2013-05-11 MED ORDER — HEPARIN SOD (PORK) LOCK FLUSH 100 UNIT/ML IV SOLN
500.0000 [IU] | Freq: Once | INTRAVENOUS | Status: AC | PRN
  Administered 2013-05-11: 500 [IU]
  Filled 2013-05-11: qty 5

## 2013-05-11 MED ORDER — DIPHENHYDRAMINE HCL 25 MG PO CAPS
50.0000 mg | ORAL_CAPSULE | Freq: Once | ORAL | Status: AC
  Administered 2013-05-11: 25 mg via ORAL

## 2013-05-11 MED ORDER — SODIUM CHLORIDE 0.9 % IV SOLN
Freq: Once | INTRAVENOUS | Status: AC
  Administered 2013-05-11: 15:00:00 via INTRAVENOUS

## 2013-05-11 MED ORDER — DIPHENHYDRAMINE HCL 25 MG PO CAPS
ORAL_CAPSULE | ORAL | Status: AC
  Filled 2013-05-11: qty 1

## 2013-05-11 MED ORDER — TRASTUZUMAB CHEMO INJECTION 440 MG
6.0000 mg/kg | Freq: Once | INTRAVENOUS | Status: AC
  Administered 2013-05-11: 441 mg via INTRAVENOUS
  Filled 2013-05-11: qty 21

## 2013-05-11 MED ORDER — ACETAMINOPHEN 325 MG PO TABS
650.0000 mg | ORAL_TABLET | Freq: Once | ORAL | Status: AC
  Administered 2013-05-11: 650 mg via ORAL

## 2013-05-11 MED ORDER — ACETAMINOPHEN 325 MG PO TABS
ORAL_TABLET | ORAL | Status: AC
  Filled 2013-05-11: qty 2

## 2013-05-11 NOTE — Telephone Encounter (Signed)
Per staff message and POF I have scheduled appts.  JMW  

## 2013-05-11 NOTE — Telephone Encounter (Signed)
appts made and printed. Pt is aware that tx will be added. i emailed MW to add the tx's...td 

## 2013-05-11 NOTE — Patient Instructions (Signed)
[facility] Discharge Instructions for Patients Receiving Chemotherapy  Today you received the following chemotherapy agents Herceptin.  To help prevent nausea and vomiting after your treatment, we encourage you to take your nausea medication as prescribed.   If you develop nausea and vomiting that is not controlled by your nausea medication, call the clinic.   BELOW ARE SYMPTOMS THAT SHOULD BE REPORTED IMMEDIATELY:  *FEVER GREATER THAN 100.5 F  *CHILLS WITH OR WITHOUT FEVER  NAUSEA AND VOMITING THAT IS NOT CONTROLLED WITH YOUR NAUSEA MEDICATION  *UNUSUAL SHORTNESS OF BREATH  *UNUSUAL BRUISING OR BLEEDING  TENDERNESS IN MOUTH AND THROAT WITH OR WITHOUT PRESENCE OF ULCERS  *URINARY PROBLEMS  *BOWEL PROBLEMS  UNUSUAL RASH Items with * indicate a potential emergency and should be followed up as soon as possible.  Feel free to call the clinic you have any questions or concerns. The clinic phone number is [phone].    

## 2013-05-11 NOTE — Progress Notes (Signed)
[facility]  Telephone:(336) [phone] Fax:(336) [phone]  OFFICE PROGRESS NOTE   ID: [name]   DOB: 1946/01/04  MR#: [id number]  UUV#:[id number]   PCP: [name], MD   DIAGNOSIS: [name] is a 67 y.o.  female with invasive ductal carcinoma that was ER +100% PR +100% HER-2/neu positive with a ratio of 2.45. Ki-67 was 55% and elevated tumor was grade 2.  She had a lumpectomy and sentinel node biopsy revealing T1c N1a, stage IIA breast cancer of the left breast.     PRIOR THERAPY: 1. She noted a left breast mass on self breast examination. She underwent a mammogram in 05/2012 that showed the mass. She went on to have it biopsied on 05/14/2012. The pathology revealed invasive ductal carcinoma that was ER +100% PR +100% HER-2/neu positive with a ratio of 2.45. Ki-67 was 55% and elevated tumor was grade 2. She had an MRI performed that showed the area to be measuring out at 1.6 cm.  2. She underwent a lumpectomy with sentinel node biopsy on 06/10/2012.  revealing T1c N1a, stage IIA breast cancer of the left breast.  3. Received adjuvant chemotherapy consisting of TCH (Taxotere/Carboplatin/Herceptin) from 07/14/2012 through 10/27/2012 x 6 cycles.  Continuing with every 3 week Herceptin that started on 11/03/2012.  4. She had radiation therapy from 11/29/2012 through 01/17/2013.  4. Post radiation therapy begun on letrozole 2.5 mg daily. Total of 5 years of therapy is planned   CURRENT THERAPY:  Herceptin every 3 weeks/letrozole   INTERVAL HISTORY: [name] is a 67 y.o. female who returns today for follow up today.  She is tolerating her Letrozole daily without any difficulty.  She is also receiving adjuvant Herceptin and is tolerating it very well.  She last saw Dr. Gala Romney on 05/02/13 and her echocardiogram was normal and she was cleared to continue Herceptin therapy.  A 10 point ROS is negative.    MEDICAL HISTORY: Past Medical History  Diagnosis  Date  . Hyperlipidemia   . Invasive ductal carcinoma of breast 2013    Left  . Arthritis   . Depression   . Heart murmur     heard one when she was pregnamt-maybe had echo-cannot remember  . Hypertension   . GERD (gastroesophageal reflux disease)   . Status post chemotherapy     chemotherapy consisting of TCH on 07/14/12 with 6 cycles planned. Every 3 week Herceptin starting 11/03/12.  . S/P radiation therapy  6-30 to 01-12-13                                                       6-30 to 01-12-13                                                          , Left Breast boost / 10 Gy in 5 fractions                      ALLERGIES:   No Known Allergies   MEDICATIONS:  Current Outpatient Prescriptions  Medication Sig Dispense Refill  . Ascorbic Acid (VITAMIN C) 1000 MG tablet  Take 1,000 mg by mouth daily.        Marland Kitchen aspirin 81 MG tablet Take 81 mg by mouth daily.      Marland Kitchen atenolol (TENORMIN) 50 MG tablet Take 50 mg by mouth daily.      . Cholecalciferol (VITAMIN D) 2000 UNITS tablet Take 2,000 Units by mouth daily.      . citalopram (CELEXA) 10 MG tablet Take 10 mg by mouth daily.       Marland Kitchen letrozole (FEMARA) 2.5 MG tablet Take 1 tablet (2.5 mg total) by mouth daily.  30 tablet  6  . lidocaine-prilocaine (EMLA) cream Apply 1 application topically as needed (used for chemo).      Marland Kitchen olmesartan-hydrochlorothiazide (BENICAR HCT) 20-12.5 MG per tablet Take 1 tablet by mouth daily.      Marland Kitchen omeprazole (PRILOSEC) 40 MG capsule Take 40 mg by mouth daily.      . simvastatin (ZOCOR) 80 MG tablet Take 40 mg by mouth at bedtime.       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  1 Units  0  . VESICARE 5 MG tablet       . vitamin E 400 UNIT capsule Take 400 Units by mouth daily.         No current facility-administered medications for this visit.    SURGICAL HISTORY:  Past Surgical History  Procedure Laterality Date  . Tubal ligation  1988  . Bladder suspension  10/2010  .  Tonsillectomy and adenoidectomy      age 52  . Abdominal adhesion surgery  1980  . Benign breast biopsy  1994    Left  . Breast lumpectomy with needle localization and axillary sentinel lymph node bx  06/10/2012    Procedure: BREAST LUMPECTOMY WITH NEEDLE LOCALIZATION AND AXILLARY SENTINEL LYMPH NODE BX;  Surgeon: Emelia Loron, MD;  Location: [facility];  Service: General;  Laterality: Left;  . Appendectomy    . Cholecystectomy    . Portacath placement  07/07/2012    Procedure: INSERTION PORT-A-CATH;  Surgeon: Emelia Loron, MD;  Location: [facility];  Service: General;  Laterality: Left;  Port a cath insertion    REVIEW OF SYSTEMS:    A 10 point review of systems was conducted and is otherwise negative except for what is noted above.        PHYSICAL EXAMINATION: Blood pressure 122/66, pulse 62, temperature 97.8 F (36.6 C), temperature source Oral, resp. rate 18, height 5\' 2"  (1.575 m), weight 161 lb 9 oz (73.284 kg). Body mass index is 29.54 kg/(m^2). General: Patient is a well appearing female in no acute distress HEENT: PERRLA, sclerae anicteric no conjunctival pallor, MMM Neck: supple, no palpable adenopathy Lungs: clear to auscultation bilaterally, no wheezes, rhonchi, or rales Cardiovascular: regular rate rhythm, S1, S2, no murmurs, rubs or gallops Abdomen: Soft, non-tender, non-distended, normoactive bowel sounds, no HSM Extremities: warm and well perfused, no clubbing, cyanosis, or edema Skin: No rashes or lesions Neuro: Non-focal Breasts: right breast no masses or nodules, left breast with radiation changes, no nodularity at lumpectomy site, hyperpigmentation, no masses or sign of recurrence ECOG PERFORMANCE STATUS: 0 - Asymptomatic  LABORATORY DATA: Lab Results  Component Value Date   WBC 4.6 05/11/2013   HGB 10.8* 05/11/2013   HCT 32.7* 05/11/2013   MCV 92.6 05/11/2013   PLT 161 05/11/2013      Chemistry      Component Value  Date/Time  NA 142 04/20/2013 1301   NA 137 08/09/2012 1935   K 4.1 04/20/2013 1301   K 4.2 08/09/2012 1935   CL 108* 11/24/2012 1043   CL 103 08/09/2012 1935   CO2 26 04/20/2013 1301   CO2 24 08/09/2012 1935   BUN 25.7 04/20/2013 1301   BUN 48* 08/09/2012 1935   CREATININE 1.4* 04/20/2013 1301   CREATININE 1.13* 08/09/2012 1935      Component Value Date/Time   CALCIUM 10.0 04/20/2013 1301   CALCIUM 9.3 08/09/2012 1935   ALKPHOS 76 04/20/2013 1301   ALKPHOS 65 06/08/2012 0924   AST 17 04/20/2013 1301   AST 22 06/08/2012 0924   ALT 13 04/20/2013 1301   ALT 21 06/08/2012 0924   BILITOT 1.00 04/20/2013 1301   BILITOT 0.5 06/08/2012 0924     FINAL DIAGNOSIS  RADIOGRAPHIC STUDIES: No results found.    ASSESSMENT:  [name] is a 67 y.o. female with:  #1   She noted a left breast mass on self breast examination. She underwent a mammogram in 05/2012 that showed the mass. She went on to have it biopsied on 05/14/2012. The pathology revealed invasive ductal carcinoma that was ER +100% PR +100% HER-2/neu positive with a ratio of 2.45. Ki-67 was 55% and elevated tumor was grade 2. She had an MRI performed that showed the area to be measuring out at 1.6 cm. She underwent a lumpectomy with sentinel node biopsy on 06/10/2012.  revealing T1c N1a, stage IIA breast cancer of the left breast.   #2 Received adjuvant chemotherapy consisting of TCH (Taxotere/Carboplatin/Herceptin) from 07/14/2012 through 10/27/2012 x 6 cycles.  Continuing with every 3 week Herceptin that started on 11/03/2012.  #3 She had radiation therapy from 11/29/2012 through 01/17/2013.  #4  Started adjuvant antiestrogen therapy with Letrozole on 01/26/2013.   She was also encouraged to take Calcium and vitamin D daily while taking Letrozole in addition to participating in weightbearing exercise.  Her last bone density was on 10/09/11 and indicated osteopenia in her right femur.     PLAN:  #1 Patient is doing well today.  She  will continue daily letrozole and proceed with herceptin today.    #2 Her last echo was 05/02/13, she was cleared to continue Herceptin therapy by Dr. Gala Romney.  She will f/u with him in 3 months.    #3 She will return in 3 weeks for labs and Herceptin only, and in 6 weeks for labs, evaluation and Herceptin.     All questions were answered.  Ms. Wherley was encouraged to contact us in the interim with any questions, concerns, or problems.    I spent 25 minutes counseling the patient face to face.  The total time spent in the appointment was 30 minutes.  Illa Level, NP Medical Oncology [facility] [phone] 05/11/2013, 1:56 PM

## 2013-05-31 ENCOUNTER — Other Ambulatory Visit: Payer: Self-pay | Admitting: Emergency Medicine

## 2013-05-31 DIAGNOSIS — C50419 Malignant neoplasm of upper-outer quadrant of unspecified female breast: Secondary | ICD-10-CM

## 2013-06-01 ENCOUNTER — Other Ambulatory Visit (HOSPITAL_BASED_OUTPATIENT_CLINIC_OR_DEPARTMENT_OTHER): Payer: BC Managed Care – PPO

## 2013-06-01 ENCOUNTER — Ambulatory Visit (HOSPITAL_BASED_OUTPATIENT_CLINIC_OR_DEPARTMENT_OTHER): Payer: BC Managed Care – PPO

## 2013-06-01 ENCOUNTER — Ambulatory Visit: Payer: BC Managed Care – PPO | Admitting: Adult Health

## 2013-06-01 VITALS — BP 143/58 | HR 63 | Temp 97.9°F | Resp 20

## 2013-06-01 DIAGNOSIS — C50219 Malignant neoplasm of upper-inner quadrant of unspecified female breast: Secondary | ICD-10-CM

## 2013-06-01 DIAGNOSIS — C50419 Malignant neoplasm of upper-outer quadrant of unspecified female breast: Secondary | ICD-10-CM

## 2013-06-01 DIAGNOSIS — Z5112 Encounter for antineoplastic immunotherapy: Secondary | ICD-10-CM

## 2013-06-01 DIAGNOSIS — C50912 Malignant neoplasm of unspecified site of left female breast: Secondary | ICD-10-CM

## 2013-06-01 DIAGNOSIS — C50412 Malignant neoplasm of upper-outer quadrant of left female breast: Secondary | ICD-10-CM

## 2013-06-01 LAB — COMPREHENSIVE METABOLIC PANEL (CC13)
ALT: 23 U/L (ref 0–55)
AST: 22 U/L (ref 5–34)
Anion Gap: 10 mEq/L (ref 3–11)
CO2: 24 mEq/L (ref 22–29)
Calcium: 9.8 mg/dL (ref 8.4–10.4)
Chloride: 106 mEq/L (ref 98–109)
Potassium: 4.3 mEq/L (ref 3.5–5.1)
Sodium: 141 mEq/L (ref 136–145)
Total Protein: 6.9 g/dL (ref 6.4–8.3)

## 2013-06-01 LAB — CBC WITH DIFFERENTIAL/PLATELET
Basophils Absolute: 0 10*3/uL (ref 0.0–0.1)
Eosinophils Absolute: 0.1 10*3/uL (ref 0.0–0.5)
HCT: 32.6 % — ABNORMAL LOW (ref 34.8–46.6)
MONO#: 0.4 10*3/uL (ref 0.1–0.9)
NEUT#: 3 10*3/uL (ref 1.5–6.5)
Platelets: 170 10*3/uL (ref 145–400)
RBC: 3.49 10*6/uL — ABNORMAL LOW (ref 3.70–5.45)
RDW: 13.4 % (ref 11.2–14.5)
WBC: 5 10*3/uL (ref 3.9–10.3)
lymph#: 1.4 10*3/uL (ref 0.9–3.3)
nRBC: 0 % (ref 0–0)

## 2013-06-01 MED ORDER — SODIUM CHLORIDE 0.9 % IV SOLN
Freq: Once | INTRAVENOUS | Status: AC
  Administered 2013-06-01: 12:00:00 via INTRAVENOUS

## 2013-06-01 MED ORDER — DIPHENHYDRAMINE HCL 25 MG PO CAPS
ORAL_CAPSULE | ORAL | Status: AC
  Filled 2013-06-01: qty 1

## 2013-06-01 MED ORDER — TRASTUZUMAB CHEMO INJECTION 440 MG
6.0000 mg/kg | Freq: Once | INTRAVENOUS | Status: AC
  Administered 2013-06-01: 441 mg via INTRAVENOUS
  Filled 2013-06-01: qty 21

## 2013-06-01 MED ORDER — ACETAMINOPHEN 325 MG PO TABS
650.0000 mg | ORAL_TABLET | Freq: Once | ORAL | Status: AC
  Administered 2013-06-01: 650 mg via ORAL

## 2013-06-01 MED ORDER — SODIUM CHLORIDE 0.9 % IJ SOLN
10.0000 mL | INTRAMUSCULAR | Status: DC | PRN
  Administered 2013-06-01: 10 mL
  Filled 2013-06-01: qty 10

## 2013-06-01 MED ORDER — HEPARIN SOD (PORK) LOCK FLUSH 100 UNIT/ML IV SOLN
500.0000 [IU] | Freq: Once | INTRAVENOUS | Status: AC | PRN
  Administered 2013-06-01: 500 [IU]
  Filled 2013-06-01: qty 5

## 2013-06-01 MED ORDER — ACETAMINOPHEN 325 MG PO TABS
ORAL_TABLET | ORAL | Status: AC
  Filled 2013-06-01: qty 2

## 2013-06-01 MED ORDER — DIPHENHYDRAMINE HCL 25 MG PO CAPS
50.0000 mg | ORAL_CAPSULE | Freq: Once | ORAL | Status: AC
  Administered 2013-06-01: 25 mg via ORAL

## 2013-06-01 NOTE — Patient Instructions (Signed)
[facility] Discharge Instructions for Patients Receiving Chemotherapy  Today you received the following chemotherapy agents Herceptin.  To help prevent nausea and vomiting after your treatment, we encourage you to take your nausea medication as prescribed.   If you develop nausea and vomiting that is not controlled by your nausea medication, call the clinic.   BELOW ARE SYMPTOMS THAT SHOULD BE REPORTED IMMEDIATELY:  *FEVER GREATER THAN 100.5 F  *CHILLS WITH OR WITHOUT FEVER  NAUSEA AND VOMITING THAT IS NOT CONTROLLED WITH YOUR NAUSEA MEDICATION  *UNUSUAL SHORTNESS OF BREATH  *UNUSUAL BRUISING OR BLEEDING  TENDERNESS IN MOUTH AND THROAT WITH OR WITHOUT PRESENCE OF ULCERS  *URINARY PROBLEMS  *BOWEL PROBLEMS  UNUSUAL RASH Items with * indicate a potential emergency and should be followed up as soon as possible.  Feel free to call the clinic you have any questions or concerns. The clinic phone number is [phone].    

## 2013-06-22 ENCOUNTER — Ambulatory Visit (HOSPITAL_BASED_OUTPATIENT_CLINIC_OR_DEPARTMENT_OTHER): Payer: BC Managed Care – PPO

## 2013-06-22 ENCOUNTER — Other Ambulatory Visit: Payer: Self-pay | Admitting: *Deleted

## 2013-06-22 ENCOUNTER — Encounter: Payer: Self-pay | Admitting: Adult Health

## 2013-06-22 ENCOUNTER — Other Ambulatory Visit (HOSPITAL_BASED_OUTPATIENT_CLINIC_OR_DEPARTMENT_OTHER): Payer: BC Managed Care – PPO

## 2013-06-22 ENCOUNTER — Encounter: Payer: Self-pay | Admitting: Oncology

## 2013-06-22 ENCOUNTER — Ambulatory Visit (HOSPITAL_BASED_OUTPATIENT_CLINIC_OR_DEPARTMENT_OTHER): Payer: BC Managed Care – PPO | Admitting: Adult Health

## 2013-06-22 ENCOUNTER — Telehealth: Payer: Self-pay | Admitting: Oncology

## 2013-06-22 ENCOUNTER — Other Ambulatory Visit: Payer: Self-pay | Admitting: Emergency Medicine

## 2013-06-22 VITALS — BP 127/79 | HR 71 | Temp 98.1°F | Resp 20 | Ht 62.0 in | Wt 164.7 lb

## 2013-06-22 DIAGNOSIS — C50419 Malignant neoplasm of upper-outer quadrant of unspecified female breast: Secondary | ICD-10-CM

## 2013-06-22 DIAGNOSIS — C50219 Malignant neoplasm of upper-inner quadrant of unspecified female breast: Secondary | ICD-10-CM

## 2013-06-22 DIAGNOSIS — M949 Disorder of cartilage, unspecified: Secondary | ICD-10-CM

## 2013-06-22 DIAGNOSIS — M899 Disorder of bone, unspecified: Secondary | ICD-10-CM

## 2013-06-22 DIAGNOSIS — Z170 Estrogen receptor positive status [ER+]: Secondary | ICD-10-CM

## 2013-06-22 DIAGNOSIS — Z5112 Encounter for antineoplastic immunotherapy: Secondary | ICD-10-CM

## 2013-06-22 DIAGNOSIS — C50919 Malignant neoplasm of unspecified site of unspecified female breast: Secondary | ICD-10-CM

## 2013-06-22 LAB — COMPREHENSIVE METABOLIC PANEL (CC13)
ALT: 23 U/L (ref 0–55)
ANION GAP: 10 meq/L (ref 3–11)
AST: 25 U/L (ref 5–34)
Albumin: 4.1 g/dL (ref 3.5–5.0)
Alkaline Phosphatase: 70 U/L (ref 40–150)
BUN: 26.8 mg/dL — ABNORMAL HIGH (ref 7.0–26.0)
CALCIUM: 10.2 mg/dL (ref 8.4–10.4)
CO2: 27 meq/L (ref 22–29)
CREATININE: 1.1 mg/dL (ref 0.6–1.1)
Chloride: 104 mEq/L (ref 98–109)
Glucose: 113 mg/dl (ref 70–140)
Potassium: 4 mEq/L (ref 3.5–5.1)
Sodium: 141 mEq/L (ref 136–145)
TOTAL PROTEIN: 7.3 g/dL (ref 6.4–8.3)
Total Bilirubin: 0.78 mg/dL (ref 0.20–1.20)

## 2013-06-22 LAB — CBC WITH DIFFERENTIAL/PLATELET
BASO%: 0.4 % (ref 0.0–2.0)
Basophils Absolute: 0 10*3/uL (ref 0.0–0.1)
EOS ABS: 0.1 10*3/uL (ref 0.0–0.5)
EOS%: 1.8 % (ref 0.0–7.0)
HEMATOCRIT: 35.3 % (ref 34.8–46.6)
HGB: 11.8 g/dL (ref 11.6–15.9)
LYMPH%: 36.2 % (ref 14.0–49.7)
MCH: 30.6 pg (ref 25.1–34.0)
MCHC: 33.4 g/dL (ref 31.5–36.0)
MCV: 91.5 fL (ref 79.5–101.0)
MONO#: 0.4 10*3/uL (ref 0.1–0.9)
MONO%: 7.6 % (ref 0.0–14.0)
NEUT#: 2.8 10*3/uL (ref 1.5–6.5)
NEUT%: 54 % (ref 38.4–76.8)
NRBC: 0 % (ref 0–0)
Platelets: 195 10*3/uL (ref 145–400)
RBC: 3.86 10*6/uL (ref 3.70–5.45)
RDW: 13 % (ref 11.2–14.5)
WBC: 5.1 10*3/uL (ref 3.9–10.3)
lymph#: 1.9 10*3/uL (ref 0.9–3.3)

## 2013-06-22 MED ORDER — DIPHENHYDRAMINE HCL 25 MG PO CAPS
ORAL_CAPSULE | ORAL | Status: AC
  Filled 2013-06-22: qty 1

## 2013-06-22 MED ORDER — TRASTUZUMAB CHEMO INJECTION 440 MG
6.0000 mg/kg | Freq: Once | INTRAVENOUS | Status: AC
  Administered 2013-06-22: 441 mg via INTRAVENOUS
  Filled 2013-06-22: qty 21

## 2013-06-22 MED ORDER — SODIUM CHLORIDE 0.9 % IJ SOLN
10.0000 mL | INTRAMUSCULAR | Status: DC | PRN
  Administered 2013-06-22: 10 mL
  Filled 2013-06-22: qty 10

## 2013-06-22 MED ORDER — HEPARIN SOD (PORK) LOCK FLUSH 100 UNIT/ML IV SOLN
500.0000 [IU] | Freq: Once | INTRAVENOUS | Status: AC | PRN
  Administered 2013-06-22: 500 [IU]
  Filled 2013-06-22: qty 5

## 2013-06-22 MED ORDER — ACETAMINOPHEN 325 MG PO TABS
650.0000 mg | ORAL_TABLET | Freq: Once | ORAL | Status: AC
  Administered 2013-06-22: 650 mg via ORAL

## 2013-06-22 MED ORDER — SODIUM CHLORIDE 0.9 % IV SOLN
Freq: Once | INTRAVENOUS | Status: AC
  Administered 2013-06-22: 15:00:00 via INTRAVENOUS

## 2013-06-22 MED ORDER — ACETAMINOPHEN 325 MG PO TABS
ORAL_TABLET | ORAL | Status: AC
  Filled 2013-06-22: qty 2

## 2013-06-22 MED ORDER — DIPHENHYDRAMINE HCL 25 MG PO CAPS
50.0000 mg | ORAL_CAPSULE | Freq: Once | ORAL | Status: AC
  Administered 2013-06-22: 25 mg via ORAL

## 2013-06-22 NOTE — Patient Instructions (Signed)
[facility] Discharge Instructions for Patients Receiving Chemotherapy  Today you received the following chemotherapy agents Herceptin.      BELOW ARE SYMPTOMS THAT SHOULD BE REPORTED IMMEDIATELY:  *FEVER GREATER THAN 100.5 F  *CHILLS WITH OR WITHOUT FEVER  NAUSEA AND VOMITING THAT IS NOT CONTROLLED WITH YOUR NAUSEA MEDICATION  *UNUSUAL SHORTNESS OF BREATH  *UNUSUAL BRUISING OR BLEEDING  TENDERNESS IN MOUTH AND THROAT WITH OR WITHOUT PRESENCE OF ULCERS  *URINARY PROBLEMS  *BOWEL PROBLEMS  UNUSUAL RASH Items with * indicate a potential emergency and should be followed up as soon as possible.  Feel free to call the clinic you have any questions or concerns. The clinic phone number is [phone].    

## 2013-06-22 NOTE — Telephone Encounter (Signed)
gv pt appt shcedule for feb/march

## 2013-06-22 NOTE — Progress Notes (Signed)
[place]  Telephone:(336) [phone] [phone]  OFFICE PROGRESS NOTE   ID: [name]   DOB: Oct 18, 1945  MR#: [id number]  FVC#:[id number]   PCP: Wenda Low, MD   DIAGNOSIS: [name] is a 68 y.o.  female with invasive ductal carcinoma that was ER +100% PR +100% HER-2/neu positive with a ratio of 2.45. Ki-67 was 55% and elevated tumor was grade 2.  She had a lumpectomy and sentinel node biopsy revealing T1c N1a, stage IIA breast cancer of the left breast.     PRIOR THERAPY: 1. She noted a left breast mass on self breast examination. She underwent a mammogram in 05/2012 that showed the mass. She went on to have it biopsied on 05/14/2012. The pathology revealed invasive ductal carcinoma that was ER +100% PR +100% HER-2/neu positive with a ratio of 2.45. Ki-67 was 55% and elevated tumor was grade 2. She had an MRI performed that showed the area to be measuring out at 1.6 cm.  2. She underwent a lumpectomy with sentinel node biopsy on 06/10/2012.  revealing T1c N1a, stage IIA breast cancer of the left breast.  3. Received adjuvant chemotherapy consisting of [place] (Taxotere/Carboplatin/Herceptin) from 07/14/2012 through 10/27/2012 x 6 cycles.  Continuing with every 3 week Herceptin that started on 11/03/2012.  4. She underwent radiation therapy from 11/29/2012 through 01/17/2013.  4. Post radiation therapy begun on letrozole 2.5 mg daily. Total of 5 years of therapy is planned   CURRENT THERAPY:  Herceptin every 3 weeks/letrozole   INTERVAL HISTORY: [name] is a 68 y.o. female who returns today for follow up today.  She is tolerating her Letrozole daily without any difficulty.  She denies hot flashes, joint aches/pains, dryness.  She denies shortness of breath, chest pain, orthopnea, DOE, or any other concerns.  She is doing well and a 10 point ROS is negative.   MEDICAL HISTORY: Past Medical History  Diagnosis Date  . Hyperlipidemia   .  Invasive ductal carcinoma of breast 2013    Left  . Arthritis   . Depression   . Heart murmur     heard one when she was pregnamt-maybe had echo-cannot remember  . Hypertension   . GERD (gastroesophageal reflux disease)   . Status post chemotherapy     chemotherapy consisting of [place] on 07/14/12 with 6 cycles planned. Every 3 week Herceptin starting 11/03/12.  . S/P radiation therapy  6-30 to 01-12-13                                                       6-30 to 01-12-13                                                          , Left Breast boost / 10 Gy in 5 fractions                      ALLERGIES:   No Known Allergies   MEDICATIONS:  Current Outpatient Prescriptions  Medication Sig Dispense Refill  . Ascorbic Acid (VITAMIN C) 1000 MG tablet Take 1,000 mg by mouth daily.        Marland Kitchen  aspirin 81 MG tablet Take 81 mg by mouth daily.      Marland Kitchen atenolol (TENORMIN) 50 MG tablet Take 50 mg by mouth daily.      . Cholecalciferol (VITAMIN D) 2000 UNITS tablet Take 2,000 Units by mouth daily.      . citalopram (CELEXA) 10 MG tablet Take 10 mg by mouth daily.       Marland Kitchen letrozole (FEMARA) 2.5 MG tablet Take 1 tablet (2.5 mg total) by mouth daily.  30 tablet  6  . lidocaine-prilocaine (EMLA) cream Apply 1 application topically as needed (used for chemo).      Marland Kitchen olmesartan-hydrochlorothiazide (BENICAR HCT) 20-12.5 MG per tablet Take 1 tablet by mouth daily.      Marland Kitchen omeprazole (PRILOSEC) 40 MG capsule Take 40 mg by mouth daily.      . simvastatin (ZOCOR) 80 MG tablet Take 40 mg by mouth at bedtime.       . VESICARE 5 MG tablet       . vitamin E 400 UNIT capsule Take 400 Units by mouth daily.        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  1 Units  0   No current facility-administered medications for this visit.    SURGICAL HISTORY:  Past Surgical History  Procedure Laterality Date  . Tubal ligation  1988  . Bladder suspension  10/2010  . Tonsillectomy and adenoidectomy       age 53  . Abdominal adhesion surgery  1980  . Benign breast biopsy  1994    Left  . Breast lumpectomy with needle localization and axillary sentinel lymph node bx  06/10/2012    Procedure: BREAST LUMPECTOMY WITH NEEDLE LOCALIZATION AND AXILLARY SENTINEL LYMPH NODE BX;  Surgeon: Rolm Bookbinder, MD;  Location: [place];  Service: General;  Laterality: Left;  . Appendectomy    . Cholecystectomy    . Portacath placement  07/07/2012    Procedure: INSERTION PORT-A-CATH;  Surgeon: Rolm Bookbinder, MD;  Location: [place];  Service: General;  Laterality: Left;  Port a cath insertion    REVIEW OF SYSTEMS:    A 10 point review of systems was conducted and is otherwise negative except for what is noted above.      PHYSICAL EXAMINATION: Blood pressure 127/79, pulse 71, temperature 98.1 F (36.7 C), temperature source Oral, resp. rate 20, height '5\' 2"'  (1.575 m), weight 164 lb 11.2 oz (74.707 kg). Body mass index is 30.12 kg/(m^2). GENERAL: Patient is a well appearing female in no acute distress HEENT:  Sclerae anicteric.  Oropharynx clear and moist. No ulcerations or evidence of oropharyngeal candidiasis. Neck is supple.  NODES:  No cervical, supraclavicular, or axillary lymphadenopathy palpated.  BREAST EXAM:  right breast no masses or nodules, left breast with radiation changes, no nodularity at lumpectomy site, hyperpigmentation, no masses or sign of recurrence LUNGS:  Clear to auscultation bilaterally.  No wheezes or rhonchi. HEART:  Regular rate and rhythm. No murmur appreciated. ABDOMEN:  Soft, nontender.  Positive, normoactive bowel sounds. No organomegaly palpated. MSK:  No focal spinal tenderness to palpation. Full range of motion bilaterally in the upper extremities. EXTREMITIES:  No peripheral edema.   SKIN:  Clear with no obvious rashes or skin changes. No nail dyscrasia. NEURO:  Nonfocal. Well oriented.  Appropriate affect. ECOG PERFORMANCE  STATUS: 0 - Asymptomatic  LABORATORY DATA: Lab Results  Component Value Date   WBC 5.1 06/22/2013   HGB  11.8 06/22/2013   HCT 35.3 06/22/2013   MCV 91.5 06/22/2013   PLT 195 06/22/2013      Chemistry      Component Value Date/Time   NA 141 06/22/2013 1241   NA 137 08/09/2012 1935   K 4.0 06/22/2013 1241   K 4.2 08/09/2012 1935   CL 108* 11/24/2012 1043   CL 103 08/09/2012 1935   CO2 27 06/22/2013 1241   CO2 24 08/09/2012 1935   BUN 26.8* 06/22/2013 1241   BUN 48* 08/09/2012 1935   CREATININE 1.1 06/22/2013 1241   CREATININE 1.13* 08/09/2012 1935      Component Value Date/Time   CALCIUM 10.2 06/22/2013 1241   CALCIUM 9.3 08/09/2012 1935   ALKPHOS 70 06/22/2013 1241   ALKPHOS 65 06/08/2012 0924   AST 25 06/22/2013 1241   AST 22 06/08/2012 0924   ALT 23 06/22/2013 1241   ALT 21 06/08/2012 0924   BILITOT 0.78 06/22/2013 1241   BILITOT 0.5 06/08/2012 0924     FINAL DIAGNOSIS  RADIOGRAPHIC STUDIES: No results found.    ASSESSMENT:  [name] is a 68 y.o. female with:  #1   She noted a left breast mass on self breast examination. She underwent a mammogram in 05/2012 that showed the mass. She went on to have it biopsied on 05/14/2012. The pathology revealed invasive ductal carcinoma that was ER +100% PR +100% HER-2/neu positive with a ratio of 2.45. Ki-67 was 55% and elevated tumor was grade 2. She had an MRI performed that showed the area to be measuring out at 1.6 cm. She underwent a lumpectomy with sentinel node biopsy on 06/10/2012.  revealing T1c N1a, stage IIA breast cancer of the left breast.   #2 Received adjuvant chemotherapy consisting of [place] (Taxotere/Carboplatin/Herceptin) from 07/14/2012 through 10/27/2012 x 6 cycles.  Continuing with every 3 week Herceptin that started on 11/03/2012.  #3 She underwent radiation therapy from 11/29/2012 through 01/17/2013.  #4  Started adjuvant antiestrogen therapy with Letrozole on 01/26/2013.   She was also encouraged to take Calcium and  vitamin D daily while taking Letrozole in addition to participating in weightbearing exercise.  Her last bone density was on 10/09/11 and indicated osteopenia in her right femur.     PLAN:  #1  Patient is doing well.  Her CBC is stable.  I reviewed this with her in detail.  She will proceed with Herceptin today, she is tolerating this well.  She last saw Dr. Haroldine Laws on 05/02/13, she is due for f/u in March.   #2 She will continue taking Letrozole daily.  She continues to tolerate this well and is taking Calcium, Vitamin D and exercising as recommended.    #3  She will complete Herceptin 08/03/13, she will have her port removed by Dr. Donne Hazel after this date.    #4  She will return to see [place] in 3 weeks for labs and evaluation followed by Herceptin therapy.     All questions were answered.  [name] was encouraged to contact us in the interim with any questions, concerns, or problems.    I spent 25 minutes counseling the patient face to face.  The total time spent in the appointment was 30 minutes.  Minette Headland, [place] [phone] 06/24/2013, 10:15 AM

## 2013-07-12 ENCOUNTER — Other Ambulatory Visit: Payer: Self-pay | Admitting: *Deleted

## 2013-07-12 DIAGNOSIS — C50419 Malignant neoplasm of upper-outer quadrant of unspecified female breast: Secondary | ICD-10-CM

## 2013-07-13 ENCOUNTER — Telehealth: Payer: Self-pay | Admitting: Adult Health

## 2013-07-13 ENCOUNTER — Other Ambulatory Visit (HOSPITAL_BASED_OUTPATIENT_CLINIC_OR_DEPARTMENT_OTHER): Payer: BC Managed Care – PPO

## 2013-07-13 ENCOUNTER — Encounter: Payer: Self-pay | Admitting: Adult Health

## 2013-07-13 ENCOUNTER — Ambulatory Visit (HOSPITAL_BASED_OUTPATIENT_CLINIC_OR_DEPARTMENT_OTHER): Payer: BC Managed Care – PPO

## 2013-07-13 ENCOUNTER — Ambulatory Visit (HOSPITAL_BASED_OUTPATIENT_CLINIC_OR_DEPARTMENT_OTHER): Payer: BC Managed Care – PPO | Admitting: Adult Health

## 2013-07-13 VITALS — BP 136/77 | HR 71 | Temp 97.8°F | Resp 18 | Ht 62.0 in | Wt 168.3 lb

## 2013-07-13 DIAGNOSIS — C50219 Malignant neoplasm of upper-inner quadrant of unspecified female breast: Secondary | ICD-10-CM

## 2013-07-13 DIAGNOSIS — C50419 Malignant neoplasm of upper-outer quadrant of unspecified female breast: Secondary | ICD-10-CM

## 2013-07-13 DIAGNOSIS — C50919 Malignant neoplasm of unspecified site of unspecified female breast: Secondary | ICD-10-CM

## 2013-07-13 DIAGNOSIS — Z5112 Encounter for antineoplastic immunotherapy: Secondary | ICD-10-CM

## 2013-07-13 DIAGNOSIS — Z170 Estrogen receptor positive status [ER+]: Secondary | ICD-10-CM

## 2013-07-13 LAB — COMPREHENSIVE METABOLIC PANEL (CC13)
ALBUMIN: 4.1 g/dL (ref 3.5–5.0)
ALT: 21 U/L (ref 0–55)
AST: 24 U/L (ref 5–34)
Alkaline Phosphatase: 74 U/L (ref 40–150)
Anion Gap: 8 mEq/L (ref 3–11)
BUN: 31.1 mg/dL — ABNORMAL HIGH (ref 7.0–26.0)
CALCIUM: 10 mg/dL (ref 8.4–10.4)
CO2: 26 mEq/L (ref 22–29)
Chloride: 106 mEq/L (ref 98–109)
Creatinine: 1.1 mg/dL (ref 0.6–1.1)
Glucose: 92 mg/dl (ref 70–140)
POTASSIUM: 4.4 meq/L (ref 3.5–5.1)
SODIUM: 140 meq/L (ref 136–145)
Total Bilirubin: 0.51 mg/dL (ref 0.20–1.20)
Total Protein: 6.9 g/dL (ref 6.4–8.3)

## 2013-07-13 LAB — CBC WITH DIFFERENTIAL/PLATELET
BASO%: 1.7 % (ref 0.0–2.0)
Basophils Absolute: 0.1 10*3/uL (ref 0.0–0.1)
EOS%: 2.5 % (ref 0.0–7.0)
Eosinophils Absolute: 0.1 10*3/uL (ref 0.0–0.5)
HCT: 33.9 % — ABNORMAL LOW (ref 34.8–46.6)
HEMOGLOBIN: 11.3 g/dL — AB (ref 11.6–15.9)
LYMPH#: 2.3 10*3/uL (ref 0.9–3.3)
LYMPH%: 43.2 % (ref 14.0–49.7)
MCH: 30.7 pg (ref 25.1–34.0)
MCHC: 33.3 g/dL (ref 31.5–36.0)
MCV: 92.1 fL (ref 79.5–101.0)
MONO#: 0.4 10*3/uL (ref 0.1–0.9)
MONO%: 6.8 % (ref 0.0–14.0)
NEUT#: 2.4 10*3/uL (ref 1.5–6.5)
NEUT%: 45.8 % (ref 38.4–76.8)
Platelets: 175 10*3/uL (ref 145–400)
RBC: 3.68 10*6/uL — ABNORMAL LOW (ref 3.70–5.45)
RDW: 13.2 % (ref 11.2–14.5)
WBC: 5.3 10*3/uL (ref 3.9–10.3)

## 2013-07-13 MED ORDER — HEPARIN SOD (PORK) LOCK FLUSH 100 UNIT/ML IV SOLN
500.0000 [IU] | Freq: Once | INTRAVENOUS | Status: AC | PRN
  Administered 2013-07-13: 500 [IU]
  Filled 2013-07-13: qty 5

## 2013-07-13 MED ORDER — ACETAMINOPHEN 325 MG PO TABS
ORAL_TABLET | ORAL | Status: AC
  Filled 2013-07-13: qty 2

## 2013-07-13 MED ORDER — DIPHENHYDRAMINE HCL 25 MG PO CAPS
50.0000 mg | ORAL_CAPSULE | Freq: Once | ORAL | Status: AC
  Administered 2013-07-13: 25 mg via ORAL

## 2013-07-13 MED ORDER — ACETAMINOPHEN 325 MG PO TABS
650.0000 mg | ORAL_TABLET | Freq: Once | ORAL | Status: AC
  Administered 2013-07-13: 650 mg via ORAL

## 2013-07-13 MED ORDER — TRASTUZUMAB CHEMO INJECTION 440 MG
6.0000 mg/kg | Freq: Once | INTRAVENOUS | Status: AC
  Administered 2013-07-13: 441 mg via INTRAVENOUS
  Filled 2013-07-13: qty 21

## 2013-07-13 MED ORDER — SODIUM CHLORIDE 0.9 % IJ SOLN
10.0000 mL | INTRAMUSCULAR | Status: DC | PRN
  Administered 2013-07-13: 10 mL
  Filled 2013-07-13: qty 10

## 2013-07-13 MED ORDER — SODIUM CHLORIDE 0.9 % IV SOLN
Freq: Once | INTRAVENOUS | Status: AC
  Administered 2013-07-13: 13:00:00 via INTRAVENOUS

## 2013-07-13 MED ORDER — DIPHENHYDRAMINE HCL 25 MG PO CAPS
ORAL_CAPSULE | ORAL | Status: AC
  Filled 2013-07-13: qty 2

## 2013-07-13 NOTE — Progress Notes (Signed)
[place]  Telephone:(336) [phone] Fax:(336) [phone]  OFFICE PROGRESS NOTE   ID: [name]   DOB: 1945-12-29  MR#: [id number]  OMA#:[id number]   PCP: Wenda Low, MD   DIAGNOSIS: [name] is a 68 y.o.  female with invasive ductal carcinoma that was ER +100% PR +100% HER-2/neu positive with a ratio of 2.45. Ki-67 was 55% and elevated tumor was grade 2.  She had a lumpectomy and sentinel node biopsy revealing T1c N1a, stage IIA breast cancer of the left breast.     PRIOR THERAPY: 1. She noted a left breast mass on self breast examination. She underwent a mammogram in 05/2012 that showed the mass. She went on to have it biopsied on 05/14/2012. The pathology revealed invasive ductal carcinoma that was ER +100% PR +100% HER-2/neu positive with a ratio of 2.45. Ki-67 was 55% and elevated tumor was grade 2. She had an MRI performed that showed the area to be measuring out at 1.6 cm.  2. She underwent a lumpectomy with sentinel node biopsy on 06/10/2012.  revealing T1c N1a, stage IIA breast cancer of the left breast.  3. Received adjuvant chemotherapy consisting of [place] (Taxotere/Carboplatin/Herceptin) from 07/14/2012 through 10/27/2012 x 6 cycles.  Continuing with every 3 week Herceptin that started on 11/03/2012.  4. She underwent radiation therapy from 11/29/2012 through 01/17/2013.  4. Post radiation therapy begun on letrozole 2.5 mg daily. Total of 5 years of therapy is planned   CURRENT THERAPY:  Herceptin every 3 weeks/letrozole  INTERVAL HISTORY: [name] is a 68 y.o. female who returns today for follow up today.  She continues to take letrozole daily and is tolerating it well.  She is here for her final herceptin.  She denies fevers, chills, nausea, vomiting, chest pain, palpitation, DOE, orthopnea, swelling, or any further concerns.  A 10 point ROS is neg.   MEDICAL HISTORY: Past Medical History  Diagnosis Date  . Hyperlipidemia   . Invasive  ductal carcinoma of breast 2013    Left  . Arthritis   . Depression   . Heart murmur     heard one when she was pregnamt-maybe had echo-cannot remember  . Hypertension   . GERD (gastroesophageal reflux disease)   . Status post chemotherapy     chemotherapy consisting of [place] on 07/14/12 with 6 cycles planned. Every 3 week Herceptin starting 11/03/12.  . S/P radiation therapy  6-30 to 01-12-13                                                       6-30 to 01-12-13                                                          , Left Breast boost / 10 Gy in 5 fractions                      ALLERGIES:   No Known Allergies   MEDICATIONS:  Current Outpatient Prescriptions  Medication Sig Dispense Refill  . Ascorbic Acid (VITAMIN C) 1000 MG tablet Take 1,000 mg by mouth daily.        Marland Kitchen  aspirin 81 MG tablet Take 81 mg by mouth daily.      Marland Kitchen atenolol (TENORMIN) 50 MG tablet Take 50 mg by mouth daily.      . Cholecalciferol (VITAMIN D) 2000 UNITS tablet Take 2,000 Units by mouth daily.      . citalopram (CELEXA) 10 MG tablet Take 10 mg by mouth daily.       Marland Kitchen letrozole (FEMARA) 2.5 MG tablet Take 1 tablet (2.5 mg total) by mouth daily.  30 tablet  6  . lidocaine-prilocaine (EMLA) cream Apply 1 application topically as needed (used for chemo).      Marland Kitchen olmesartan-hydrochlorothiazide (BENICAR HCT) 20-12.5 MG per tablet Take 1 tablet by mouth daily.      Marland Kitchen omeprazole (PRILOSEC) 40 MG capsule Take 40 mg by mouth daily.      . simvastatin (ZOCOR) 80 MG tablet Take 40 mg by mouth at bedtime.       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  1 Units  0  . VESICARE 5 MG tablet       . vitamin E 400 UNIT capsule Take 400 Units by mouth daily.         No current facility-administered medications for this visit.    SURGICAL HISTORY:  Past Surgical History  Procedure Laterality Date  . Tubal ligation  1988  . Bladder suspension  10/2010  . Tonsillectomy and adenoidectomy       age 1  . Abdominal adhesion surgery  1980  . Benign breast biopsy  1994    Left  . Breast lumpectomy with needle localization and axillary sentinel lymph node bx  06/10/2012    Procedure: BREAST LUMPECTOMY WITH NEEDLE LOCALIZATION AND AXILLARY SENTINEL LYMPH NODE BX;  Surgeon: Rolm Bookbinder, MD;  Location: [place];  Service: General;  Laterality: Left;  . Appendectomy    . Cholecystectomy    . Portacath placement  07/07/2012    Procedure: INSERTION PORT-A-CATH;  Surgeon: Rolm Bookbinder, MD;  Location: [place];  Service: General;  Laterality: Left;  Port a cath insertion    REVIEW OF SYSTEMS:    A 10 point review of systems was conducted and is otherwise negative except for what is noted above.      PHYSICAL EXAMINATION: Blood pressure 136/77, pulse 71, temperature 97.8 F (36.6 C), temperature source Oral, resp. rate 18, height _0  (1.575 m), weight 168 lb 4.8 oz (76.34 kg). Body mass index is 30.77 kg/(m^2). GENERAL: Patient is a well appearing female in no acute distress HEENT:  Sclerae anicteric.  Oropharynx clear and moist. No ulcerations or evidence of oropharyngeal candidiasis. Neck is supple.  NODES:  No cervical, supraclavicular, or axillary lymphadenopathy palpated.  BREAST EXAM:  right breast no masses or nodules, left breast with radiation changes, no nodularity at lumpectomy site, hyperpigmentation, no masses or sign of recurrence LUNGS:  Clear to auscultation bilaterally.  No wheezes or rhonchi. HEART:  Regular rate and rhythm. No murmur appreciated. ABDOMEN:  Soft, nontender.  Positive, normoactive bowel sounds. No organomegaly palpated. MSK:  No focal spinal tenderness to palpation. Full range of motion bilaterally in the upper extremities. EXTREMITIES:  No peripheral edema.   SKIN:  Clear with no obvious rashes or skin changes. No nail dyscrasia. NEURO:  Nonfocal. Well oriented.  Appropriate affect. ECOG PERFORMANCE STATUS: 0 -  Asymptomatic  LABORATORY DATA: Lab Results  Component Value Date   WBC 5.3 07/13/2013   HGB  11.3* 07/13/2013   HCT 33.9* 07/13/2013   MCV 92.1 07/13/2013   PLT 175 07/13/2013      Chemistry      Component Value Date/Time   NA 140 07/13/2013 1111   NA 137 08/09/2012 1935   K 4.4 07/13/2013 1111   K 4.2 08/09/2012 1935   CL 108* 11/24/2012 1043   CL 103 08/09/2012 1935   CO2 26 07/13/2013 1111   CO2 24 08/09/2012 1935   BUN 31.1* 07/13/2013 1111   BUN 48* 08/09/2012 1935   CREATININE 1.1 07/13/2013 1111   CREATININE 1.13* 08/09/2012 1935      Component Value Date/Time   CALCIUM 10.0 07/13/2013 1111   CALCIUM 9.3 08/09/2012 1935   ALKPHOS 74 07/13/2013 1111   ALKPHOS 65 06/08/2012 0924   AST 24 07/13/2013 1111   AST 22 06/08/2012 0924   ALT 21 07/13/2013 1111   ALT 21 06/08/2012 0924   BILITOT 0.51 07/13/2013 1111   BILITOT 0.5 06/08/2012 0924     FINAL DIAGNOSIS  RADIOGRAPHIC STUDIES: No results found.    ASSESSMENT:  [name] is a 68 y.o. female with:  #1   She noted a left breast mass on self breast examination. She underwent a mammogram in 05/2012 that showed the mass. She went on to have it biopsied on 05/14/2012. The pathology revealed invasive ductal carcinoma that was ER +100% PR +100% HER-2/neu positive with a ratio of 2.45. Ki-67 was 55% and elevated tumor was grade 2. She had an MRI performed that showed the area to be measuring out at 1.6 cm. She underwent a lumpectomy with sentinel node biopsy on 06/10/2012.  revealing T1c N1a, stage IIA breast cancer of the left breast.   #2 Received adjuvant chemotherapy consisting of [place] (Taxotere/Carboplatin/Herceptin) from 07/14/2012 through 10/27/2012 x 6 cycles.  Continuing with every 3 week Herceptin that started on 11/03/2012.  #3 She underwent radiation therapy from 11/29/2012 through 01/17/2013.  #4  Started adjuvant antiestrogen therapy with Letrozole on 01/26/2013.   She was also encouraged to take Calcium and vitamin D daily  while taking Letrozole in addition to participating in weightbearing exercise.  Her last bone density was on 10/09/11 and indicated osteopenia in her right femur.     PLAN:  #1  Patient is doing well.  Her CBC is stable.  I reviewed this with her in detail.  She will proceed with Herceptin today, she is tolerating this well.  She last saw Dr. Haroldine Laws on 05/02/13, she is due for f/u in March and I requested it today.    #2 She will continue taking Letrozole daily.  She continues to tolerate this well and is taking Calcium, Vitamin D and exercising as recommended.    #3  She will complete Herceptin today, 07/13/13, she will have her port removed by Dr. Donne Hazel after this date.    #4  She will return to see [place] in 3 months time.     All questions were answered.  [name] was encouraged to contact us in the interim with any questions, concerns, or problems.    I spent 25 minutes counseling the patient face to face.  The total time spent in the appointment was 30 minutes.  Minette Headland, [place] [phone] 07/15/2013, 9:49 AM

## 2013-07-18 ENCOUNTER — Telehealth: Payer: Self-pay | Admitting: Oncology

## 2013-07-25 ENCOUNTER — Other Ambulatory Visit: Payer: Self-pay | Admitting: *Deleted

## 2013-07-25 DIAGNOSIS — C50419 Malignant neoplasm of upper-outer quadrant of unspecified female breast: Secondary | ICD-10-CM

## 2013-07-25 MED ORDER — LETROZOLE 2.5 MG PO TABS: 2.5000 mg | ORAL_TABLET | Freq: Every day | ORAL | Status: DC

## 2013-07-27 ENCOUNTER — Other Ambulatory Visit: Payer: Self-pay | Admitting: Geriatric Medicine

## 2013-07-27 ENCOUNTER — Ambulatory Visit
Admission: RE | Admit: 2013-07-27 | Discharge: 2013-07-27 | Disposition: A | Discharge status: Home / Self Care | Payer: BC Managed Care – PPO | Source: Ambulatory Visit | Attending: Geriatric Medicine | Admitting: Geriatric Medicine

## 2013-07-27 ENCOUNTER — Ambulatory Visit
Admission: RE | Admit: 2013-07-27 | Discharge: 2013-07-27 | Disposition: A | Discharge status: Home / Self Care | Payer: Medicare Other | Source: Ambulatory Visit | Attending: Geriatric Medicine | Admitting: Geriatric Medicine

## 2013-07-27 DIAGNOSIS — R52 Pain, unspecified: Secondary | ICD-10-CM

## 2013-07-27 MED ORDER — IOHEXOL 300 MG/ML  SOLN
100.0000 mL | Freq: Once | INTRAMUSCULAR | Status: AC | PRN
  Administered 2013-07-27: 100 mL via INTRAVENOUS

## 2013-07-27 MED ORDER — IOHEXOL 300 MG/ML  SOLN
40.0000 mL | Freq: Once | INTRAMUSCULAR | Status: AC | PRN
  Administered 2013-07-27: 40 mL via ORAL

## 2013-08-03 ENCOUNTER — Other Ambulatory Visit: Payer: BC Managed Care – PPO

## 2013-08-03 ENCOUNTER — Ambulatory Visit: Payer: BC Managed Care – PPO

## 2013-08-03 ENCOUNTER — Ambulatory Visit: Payer: BC Managed Care – PPO | Admitting: Adult Health

## 2013-08-22 ENCOUNTER — Ambulatory Visit (HOSPITAL_COMMUNITY)
Admission: RE | Admit: 2013-08-22 | Discharge: 2013-08-22 | Disposition: A | Discharge status: Home / Self Care | Payer: BC Managed Care – PPO | Source: Ambulatory Visit | Attending: Radiation Oncology | Admitting: Radiation Oncology

## 2013-08-22 ENCOUNTER — Ambulatory Visit (HOSPITAL_BASED_OUTPATIENT_CLINIC_OR_DEPARTMENT_OTHER)
Admission: RE | Admit: 2013-08-22 | Discharge: 2013-08-22 | Disposition: A | Discharge status: Home / Self Care | Payer: BC Managed Care – PPO | Source: Ambulatory Visit | Attending: Radiation Oncology | Admitting: Radiation Oncology

## 2013-08-22 VITALS — BP 132/74 | HR 63 | Wt 165.5 lb

## 2013-08-22 DIAGNOSIS — C50119 Malignant neoplasm of central portion of unspecified female breast: Secondary | ICD-10-CM

## 2013-08-22 DIAGNOSIS — C50419 Malignant neoplasm of upper-outer quadrant of unspecified female breast: Secondary | ICD-10-CM

## 2013-08-22 DIAGNOSIS — I517 Cardiomegaly: Secondary | ICD-10-CM

## 2013-08-22 DIAGNOSIS — R079 Chest pain, unspecified: Secondary | ICD-10-CM

## 2013-08-22 NOTE — Progress Notes (Signed)
Echocardiogram 2D Echocardiogram has been performed.  [name] 08/22/2013, 10:13 AM

## 2013-08-22 NOTE — Patient Instructions (Signed)
Your physician has requested that you have an echocardiogram. Echocardiography is a painless test that uses sound waves to create images of your heart. It provides your doctor with information about the size and shape of your heart and how well your heart's chambers and valves are working. This procedure takes approximately one hour. There are no restrictions for this procedure.   

## 2013-08-22 NOTE — Progress Notes (Signed)
Patient ID: ZAN TRISKA, female   DOB: December 11, 1945, 68 y.o.   MRN: [id number] Onocologist: Dr Humphrey Rolls General Surgeon: Dr Donne Hazel PCP: Dr Deforest Hoyles  HPI: Shylie is a 68 year old with PMH of depression, HTN and triple-positive breast cancer - invasive ductal carcinoma grade 2 ER +100% PR +100% HER-2/neu +.   Treatment is with combination Herceptin and chemotherapy consisting of Taxotere carboplatinum and Herceptin.  Taxotere and carboplatinum will be given every 3 weeks for a total of 46 cycles. Herceptin would be given initially weekly with her chemotherapy and then changed to every 3 weeks to finish out one year of HER-2 therapy. Completes Herceptin in 3/15  ECHO:  07/06/12 EF 60% Lateral S' 8.4 Pre treatment 10/04/12 EF 55-60% lat s' 8.4  01/17/13 EF 60% lat s' 9.6-10.0  05/02/13 EF 55-60%, lateral s' 10, GLS -22.5% 08/22/13 EF 55-60%, lateral s' 8.4, GLS -17.9%   Follow up: Doing great. Denies orthopnea, SOB, CP or edema. She completed Herceptin in early February.   Review of Systems: All pertinent positives and negatives as in HPI, otherwise negative.    Past Medical History  Diagnosis Date  . Hyperlipidemia   . Invasive ductal carcinoma of breast 2013    Left  . Arthritis   . Depression   . Heart murmur     heard one when she was pregnamt-maybe had echo-cannot remember  . Hypertension   . GERD (gastroesophageal reflux disease)   . Status post chemotherapy     chemotherapy consisting of [place] on 07/14/12 with 6 cycles planned. Every 3 week Herceptin starting 11/03/12.  . S/P radiation therapy  6-30 to 01-12-13                                                       6-30 to 01-12-13                                                          , Left Breast boost / 10 Gy in 5 fractions                      Current Outpatient Prescriptions  Medication Sig Dispense Refill  . Ascorbic Acid (VITAMIN C) 1000 MG tablet Take 1,000 mg by mouth daily.        Marland Kitchen aspirin 81 MG tablet Take 81 mg by mouth  daily.      Marland Kitchen atenolol (TENORMIN) 50 MG tablet Take 50 mg by mouth daily.      . Cholecalciferol (VITAMIN D) 2000 UNITS tablet Take 2,000 Units by mouth daily.      . citalopram (CELEXA) 10 MG tablet Take 10 mg by mouth daily.       Marland Kitchen letrozole (FEMARA) 2.5 MG tablet Take 1 tablet (2.5 mg total) by mouth daily.  30 tablet  2  . lidocaine-prilocaine (EMLA) cream Apply 1 application topically as needed (used for chemo).      Marland Kitchen olmesartan-hydrochlorothiazide (BENICAR HCT) 20-12.5 MG per tablet Take 1 tablet by mouth daily.      Marland Kitchen omeprazole (PRILOSEC) 40 MG capsule Take 40 mg by mouth daily.      Marland Kitchen  simvastatin (ZOCOR) 80 MG tablet Take 40 mg by mouth at bedtime.       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  1 Units  0  . VESICARE 5 MG tablet       . vitamin E 400 UNIT capsule Take 400 Units by mouth daily.         No current facility-administered medications for this encounter.   No Known Allergies  Filed Vitals:   08/22/13 1109  BP: 132/74  Pulse: 63  Weight: 165 lb 8 oz (75.07 kg)  SpO2: 98%   PHYSICAL EXAM: General:  Well appearing. No respiratory difficulty HEENT: normal Neck: supple. no JVD. Carotids 2+ bilat; no bruits. No lymphadenopathy or thryomegaly appreciated.  Cor: PMI nondisplaced. Regular rate & rhythm. No rubs, gallops 2/6 TR Lungs: clear Abdomen: soft, nontender, nondistended. No hepatosplenomegaly. No bruits or masses. Good bowel sounds. Extremities: no cyanosis, clubbing, rash, edema Neuro: alert & oriented x 3, cranial nerves grossly intact. moves all 4 extremities w/o difficulty. Affect pleasant.  ASSESSMENT & PLAN:  1) Breast cancer: She has now completed Herceptin.  I reviewed today's echo.  EF is normal at 55-60% but lateral s' lower and global longitudinal strain less negative.  As she is already off Herceptin, I am not going to make any med changes.  I will, however, get a repeat echo in 3-4 months to make sure that LV  function remains normal.  2) HTN:  Stable on benicar and atenolol.  Loralie Champagne 08/22/2013

## 2013-09-07 ENCOUNTER — Encounter: Payer: Self-pay | Admitting: *Deleted

## 2013-09-20 ENCOUNTER — Encounter: Payer: Self-pay | Admitting: Internal Medicine

## 2013-09-20 ENCOUNTER — Ambulatory Visit (INDEPENDENT_AMBULATORY_CARE_PROVIDER_SITE_OTHER): Payer: BC Managed Care – PPO | Admitting: Internal Medicine

## 2013-09-20 VITALS — BP 120/70 | HR 60 | Ht 62.0 in | Wt 166.0 lb

## 2013-09-20 DIAGNOSIS — D649 Anemia, unspecified: Secondary | ICD-10-CM

## 2013-09-20 DIAGNOSIS — R933 Abnormal findings on diagnostic imaging of other parts of digestive tract: Secondary | ICD-10-CM

## 2013-09-20 DIAGNOSIS — D509 Iron deficiency anemia, unspecified: Secondary | ICD-10-CM

## 2013-09-20 NOTE — Patient Instructions (Addendum)
You have been given hemoccult cards today. Please follow directions on package and mail the cards back via [employer]PS.  Please purchase the following medications over the counter and take as directed: Iron 165 mg-1 tablet daily. Stool Softener-1-2 tablets daily  Please decrease Tenormin to 25 mg daily.  Dr Deforest Hoyles

## 2013-09-20 NOTE — Progress Notes (Signed)
[name] 1945/10/21 [id number]  Note: This dictation was prepared with Dragon digital system. Any transcriptional errors that result from this procedure are unintentional.   History of Present Illness:  This is a 68 year old white female with an episode of severe left lower quadrant abdominal pain which occurred about 2 months ago. A CT scan of the abdomen showed inflammatory changes in the left colon not typical of diverticulitis. She took an   antibiotic for 2 weeks with almost complete resolution of the pain. She still has mild discomfort in the left lower quadrant. The episode was preceded by constipation. She takes 2 blood pressure medications which are constipating. She  denies feeling dizzy or orthostatic. We have done prior colonoscopies  because of a family history of colon cancer in her mother. In 1996, in 2000 and most recently in January 2013, colonoscopies showed a hyperplastic polyp and moderately severe diverticulosis of the sigmoid colon. She had left breast cancer T1N1,  treated with chemotherapy and radiation which was completed in August 2014. She denies any upper GI symptoms of nausea, vomiting, heartburn or dysphagia. She has been anemic with a hemoglobin of 11.3, hematocrit of 30.4 with iron saturation of 10%. She has not been taking iron.    Past Medical History  Diagnosis Date  . Hyperlipidemia   . Invasive ductal carcinoma of breast 2013    Left  . Arthritis   . Depression   . Heart murmur     heard one when she was pregnamt-maybe had echo-cannot remember  . Hypertension   . GERD (gastroesophageal reflux disease)   . Status post chemotherapy     chemotherapy consisting of [place] on 07/14/12 with 6 cycles planned. Every 3 week Herceptin starting 11/03/12.  . S/P radiation therapy  6-30 to 01-12-13                                                       6-30 to 01-12-13                                                          , Left Breast boost / 10 Gy in 5 fractions                     . Iron deficiency anemia   . Hyperplastic colon polyp     Past Surgical History  Procedure Laterality Date  . Tubal ligation  1988  . Bladder suspension  10/2010  . Tonsillectomy and adenoidectomy      age 22  . Abdominal adhesion surgery  1980  . Benign breast biopsy  1994    Left  . Breast lumpectomy with needle localization and axillary sentinel lymph node bx  06/10/2012    Procedure: BREAST LUMPECTOMY WITH NEEDLE LOCALIZATION AND AXILLARY SENTINEL LYMPH NODE BX;  Surgeon: Rolm Bookbinder, MD;  Location: [facility];  Service: General;  Laterality: Left;  . Appendectomy    . Cholecystectomy    . Portacath placement  07/07/2012    Procedure: INSERTION PORT-A-CATH;  Surgeon: Rolm Bookbinder, MD;  Location: [facility];  Service: General;  Laterality: Left;  Port a cath insertion    No Known Allergies  Family history and social history have been reviewed.  Review of Systems:  Denies upper GI symptoms weight loss rectal bleeding The remainder of the 10 point ROS is negative except as outlined in the H&P  Physical Exam: General Appearance Well developed, in no distress Eyes  Non icteric  HEENT  Non traumatic, normocephalic  Mouth No lesion, tongue papillated, no cheilosis Neck Supple without adenopathy, thyroid not enlarged, no carotid bruits, no JVD Lungs Clear to auscultation bilaterally COR Normal S1, normal S2, regular rhythm, no murmur, quiet precordium Abdomen mild tenderness along sigmoid colon. No rebound. Rectal small amount of Hemoccult negative stool Extremities  No pedal edema Skin No lesions Neurological Alert and oriented x 3 Psychological Normal mood and affect  Assessment and Plan:   Problem #1 Episode of acute left lower quadrant abdominal pain not typical of diverticulitis. I suspect she had ischemic colitis, although diverticulitis has not been completely ruled out. Her blood pressure measurements  At home stay  around 120/70 and I wonder if we need to reduce her blood pressure medications. I encourage her to drink a lot of fluids and reduce atenolol to 25 mg daily and to check with her primary care physician, Dr. Deforest Hoyles regarding this. She is up-to-date on her colonoscopy with her last exam being 2 years ago. She will be due for recall colonoscopy in January 2017.  Problem #2 Iron deficiency anemia. Patient is status post chemotherapy and radiation. The anemia is most likely related to the breast cancer. She denies any upper GI symptoms. She is Hemoccult negative on my exam today. I have given her Hemoccult cards to take home. She will start over-the-counter iron supplements and take a stool softener with that. If the Hemoccults are positive, she will need  colonoscopy and probably an upper endoscopy as well but if they are negative, no further endoscopic assessment will be done at this time. She was satisfied with the plan.    [place] Dragon 09/20/2013

## 2013-10-03 ENCOUNTER — Other Ambulatory Visit (INDEPENDENT_AMBULATORY_CARE_PROVIDER_SITE_OTHER): Payer: BC Managed Care – PPO

## 2013-10-03 DIAGNOSIS — D649 Anemia, unspecified: Secondary | ICD-10-CM

## 2013-10-03 LAB — HEMOCCULT SLIDES (X 3 CARDS)
Fecal Occult Blood: NEGATIVE
OCCULT 1: NEGATIVE
OCCULT 2: NEGATIVE
OCCULT 3: NEGATIVE
OCCULT 4: NEGATIVE
OCCULT 5: NEGATIVE

## 2013-10-07 ENCOUNTER — Other Ambulatory Visit: Payer: Self-pay | Admitting: *Deleted

## 2013-10-07 DIAGNOSIS — C50419 Malignant neoplasm of upper-outer quadrant of unspecified female breast: Secondary | ICD-10-CM

## 2013-10-10 ENCOUNTER — Encounter: Payer: Self-pay | Admitting: Adult Health

## 2013-10-10 ENCOUNTER — Other Ambulatory Visit (HOSPITAL_BASED_OUTPATIENT_CLINIC_OR_DEPARTMENT_OTHER): Payer: BC Managed Care – PPO

## 2013-10-10 ENCOUNTER — Ambulatory Visit (HOSPITAL_BASED_OUTPATIENT_CLINIC_OR_DEPARTMENT_OTHER): Payer: BC Managed Care – PPO | Admitting: Adult Health

## 2013-10-10 VITALS — BP 119/73 | HR 69 | Temp 98.3°F | Resp 18 | Ht 62.0 in | Wt 168.1 lb

## 2013-10-10 DIAGNOSIS — Z170 Estrogen receptor positive status [ER+]: Secondary | ICD-10-CM

## 2013-10-10 DIAGNOSIS — E2839 Other primary ovarian failure: Secondary | ICD-10-CM

## 2013-10-10 DIAGNOSIS — Z853 Personal history of malignant neoplasm of breast: Secondary | ICD-10-CM

## 2013-10-10 DIAGNOSIS — C50219 Malignant neoplasm of upper-inner quadrant of unspecified female breast: Secondary | ICD-10-CM

## 2013-10-10 DIAGNOSIS — C50419 Malignant neoplasm of upper-outer quadrant of unspecified female breast: Secondary | ICD-10-CM

## 2013-10-10 DIAGNOSIS — C50119 Malignant neoplasm of central portion of unspecified female breast: Secondary | ICD-10-CM

## 2013-10-10 LAB — CBC WITH DIFFERENTIAL/PLATELET
BASO%: 0.6 % (ref 0.0–2.0)
Basophils Absolute: 0 10*3/uL (ref 0.0–0.1)
EOS ABS: 0.2 10*3/uL (ref 0.0–0.5)
EOS%: 3.6 % (ref 0.0–7.0)
HCT: 34.9 % (ref 34.8–46.6)
HGB: 11.6 g/dL (ref 11.6–15.9)
LYMPH#: 1.7 10*3/uL (ref 0.9–3.3)
LYMPH%: 37.9 % (ref 14.0–49.7)
MCH: 30.8 pg (ref 25.1–34.0)
MCHC: 33.1 g/dL (ref 31.5–36.0)
MCV: 92.8 fL (ref 79.5–101.0)
MONO#: 0.4 10*3/uL (ref 0.1–0.9)
MONO%: 9.1 % (ref 0.0–14.0)
NEUT%: 48.8 % (ref 38.4–76.8)
NEUTROS ABS: 2.2 10*3/uL (ref 1.5–6.5)
Platelets: 168 10*3/uL (ref 145–400)
RBC: 3.76 10*6/uL (ref 3.70–5.45)
RDW: 14.3 % (ref 11.2–14.5)
WBC: 4.5 10*3/uL (ref 3.9–10.3)

## 2013-10-10 LAB — COMPREHENSIVE METABOLIC PANEL (CC13)
ALBUMIN: 3.8 g/dL (ref 3.5–5.0)
ALT: 20 U/L (ref 0–55)
ANION GAP: 8 meq/L (ref 3–11)
AST: 22 U/L (ref 5–34)
Alkaline Phosphatase: 66 U/L (ref 40–150)
BUN: 22.8 mg/dL (ref 7.0–26.0)
CALCIUM: 9.8 mg/dL (ref 8.4–10.4)
CHLORIDE: 109 meq/L (ref 98–109)
CO2: 28 meq/L (ref 22–29)
Creatinine: 1 mg/dL (ref 0.6–1.1)
Glucose: 88 mg/dl (ref 70–140)
POTASSIUM: 4.5 meq/L (ref 3.5–5.1)
Sodium: 144 mEq/L (ref 136–145)
Total Bilirubin: 0.58 mg/dL (ref 0.20–1.20)
Total Protein: 6.7 g/dL (ref 6.4–8.3)

## 2013-10-10 NOTE — Progress Notes (Signed)
ID: Gean Birchwood OB: 1946/01/08  MR#: [id number]  CSN#:[id number]  PCP: Wenda Low, MD GYN:   SU: Dr. Rolm Bookbinder OTHER MD: Dr. Isidore Moos, radiation oncology  CHIEF COMPLAINT:  68 y/o [place], [name] with T1 N1, stage IIA invasive ductal carcinoma of the left breast, grade II, ER 100%, PR 100%, Ki-67 of 55%, HER-2/neu negative.   BREAST CANCER HISTORY:  Patient palpated a left breast mass and underwent a mammogram in 05/2012 that demonstrated the mass.  It was biopsied on 05/14/12 and the pathology revealed triple positive, grade II, uinvasive ductal carcinoma with a Ki-67 of 55%  CURRENT THERAPY: Letrozole 2.5 mg daily  INTERVAL HISTORY: Ms. Taketa is a 68 year old female with triple [positive invasive ductal carcinoma of the left breast.  She is here today for follow up.  She is taking Letrozole daily and tolerating it well.  She denies hot flashes, increased joint aches, dryness, new pain, or any further concerns.  She did develop urinary incontinence while receiving chemotherapy and was evaluated at [facility].  She was started on Vesicare and it has since resolved.  She is otherwise doing well.  She does breast exams monthly, and is requesting for her port to be removed.  We reviewed her health maintenance below.    REVIEW OF SYSTEMS:  A 10 point review of systems was conducted and is otherwise negative except for what is noted above.     PAST MEDICAL HISTORY: Past Medical History  Diagnosis Date  . Hyperlipidemia   . Invasive ductal carcinoma of breast 2013    Left  . Arthritis   . Depression   . Heart murmur     heard one when she was pregnamt-maybe had echo-cannot remember  . Hypertension   . GERD (gastroesophageal reflux disease)   . Status post chemotherapy     chemotherapy consisting of [place] on 07/14/12 with 6 cycles planned. Every 3 week Herceptin starting 11/03/12.  . S/P radiation therapy  6-30 to 01-12-13                                                        6-30 to 01-12-13                                                          , Left Breast boost / 10 Gy in 5 fractions                    . Iron deficiency anemia   . Hyperplastic colon polyp     PAST SURGICAL HISTORY: Past Surgical History  Procedure Laterality Date  . Tubal ligation  1988  . Bladder suspension  10/2010  . Tonsillectomy and adenoidectomy      age 48  . Abdominal adhesion surgery  1980  . Benign breast biopsy  1994    Left  . Breast lumpectomy with needle localization and axillary sentinel lymph node bx  06/10/2012    Procedure: BREAST LUMPECTOMY WITH NEEDLE LOCALIZATION AND AXILLARY SENTINEL LYMPH NODE BX;  Surgeon: Rolm Bookbinder, MD;  Location: [facility];  Service: General;  Laterality: Left;  .  Appendectomy    . Cholecystectomy    . Portacath placement  07/07/2012    Procedure: INSERTION PORT-A-CATH;  Surgeon: Rolm Bookbinder, MD;  Location: [facility];  Service: General;  Laterality: Left;  Port a cath insertion    FAMILY HISTORY Family History  Problem Relation Age of Onset  . Colon cancer Mother 2  . Breast cancer Sister 39  . Prostate cancer Father 9  . Stomach cancer Maternal Grandmother 33    GYNECOLOGIC HISTORY: Menarche at age 37, G53 P2, patient took oral HRT x 2 years in the 62s.  Patient has not had any abnormal pap smears, she has no h/o sexually transmitted infections.   SOCIAL HISTORY: Lives with boyfriend [name] in a town home in [place], [name].  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      ADVANCED DIRECTIVES:  In place.  Health care power of attorney is [name] her daughter, We do not have copies, and the patient will bring these in for [place] to scan.  Dr. Glenna Durand office does have copies.     HEALTH MAINTENANCE: History  Substance Use Topics  . Smoking status: Never Smoker   . Smokeless tobacco: Never Used  . Alcohol Use: 0.6 oz/week     1 Glasses of wine per week     Comment: 1 glass of Wine per Week      Mammogram: 11/08/2012 Colonoscopy: 2013, 5 year f/u recommended Bone Density Scan: due  Pap Smear:  Unsure when last one was Eye Exam: 2 years ago  Vitamin D Level:  Next appt Lipid Panel: 2013   No Known Allergies  Current Outpatient Prescriptions  Medication Sig Dispense Refill  . Ascorbic Acid (VITAMIN C) 1000 MG tablet Take 1,000 mg by mouth daily.        Marland Kitchen aspirin 81 MG tablet Take 81 mg by mouth daily.      Marland Kitchen atenolol (TENORMIN) 50 MG tablet Take 50 mg by mouth daily.      . Cholecalciferol (VITAMIN D) 2000 UNITS tablet Take 2,000 Units by mouth daily.      . citalopram (CELEXA) 10 MG tablet Take 10 mg by mouth daily.       . ferrous sulfate 325 (65 FE) MG tablet Take 325 mg by mouth daily with breakfast.      . letrozole (FEMARA) 2.5 MG tablet Take 1 tablet (2.5 mg total) by mouth daily.  30 tablet  2  . olmesartan-hydrochlorothiazide (BENICAR HCT) 20-12.5 MG per tablet Take 1 tablet by mouth daily.      Marland Kitchen omeprazole (PRILOSEC) 40 MG capsule Take 40 mg by mouth daily.      . simvastatin (ZOCOR) 80 MG tablet Take 40 mg by mouth at bedtime.       . TOVIAZ 8 MG TB24 tablet       . UNABLE TO FIND Cranial prosthesis due to chemotherapy induced alopecia.  1 Units  0  . VESICARE 5 MG tablet       . vitamin E 400 UNIT capsule Take 400 Units by mouth daily.         No current facility-administered medications for this visit.    OBJECTIVE: Filed Vitals:   10/10/13 1038  BP: 119/73  Pulse: 69  Temp: 98.3 F (36.8 C)  Resp: 18     Body mass index is 30.74 kg/(m^2).      GENERAL: Patient is a well  appearing female in no acute distress HEENT:  Sclerae anicteric.  Oropharynx clear and moist. No ulcerations or evidence of oropharyngeal candidiasis. Neck is supple.  NODES:  No cervical, supraclavicular, or axillary lymphadenopathy palpated.  BREAST EXAM:  Right breast, no masses, nodules, skin or nipple  changes, left breast s/p lumpectomy, Radiation thickness noted, no nodularity, or sign of recurrence. Benign bilateral breast exam.   LUNGS:  Clear to auscultation bilaterally.  No wheezes or rhonchi. HEART:  Regular rate and rhythm. No murmur appreciated. ABDOMEN:  Soft, nontender.  Positive, normoactive bowel sounds. No organomegaly palpated. MSK:  No focal spinal tenderness to palpation. Full range of motion bilaterally in the upper extremities. EXTREMITIES:  No peripheral edema.   SKIN:  Clear with no obvious rashes or skin changes. No nail dyscrasia. NEURO:  Nonfocal. Well oriented.  Appropriate affect. ECOG FS:1 - Symptomatic but completely ambulatory  LAB RESULTS:  CMP     Component Value Date/Time   NA 144 10/10/2013 1014   NA 137 08/09/2012 1935   K 4.5 10/10/2013 1014   K 4.2 08/09/2012 1935   CL 108* 11/24/2012 1043   CL 103 08/09/2012 1935   CO2 28 10/10/2013 1014   CO2 24 08/09/2012 1935   GLUCOSE 88 10/10/2013 1014   GLUCOSE 109* 11/24/2012 1043   GLUCOSE 136* 08/09/2012 1935   BUN 22.8 10/10/2013 1014   BUN 48* 08/09/2012 1935   CREATININE 1.0 10/10/2013 1014   CREATININE 1.13* 08/09/2012 1935   CALCIUM 9.8 10/10/2013 1014   CALCIUM 9.3 08/09/2012 1935   PROT 6.7 10/10/2013 1014   PROT 7.5 06/08/2012 0924   ALBUMIN 3.8 10/10/2013 1014   ALBUMIN 4.0 06/08/2012 0924   AST 22 10/10/2013 1014   AST 22 06/08/2012 0924   ALT 20 10/10/2013 1014   ALT 21 06/08/2012 0924   ALKPHOS 66 10/10/2013 1014   ALKPHOS 65 06/08/2012 0924   BILITOT 0.58 10/10/2013 1014   BILITOT 0.5 06/08/2012 0924   GFRNONAA 49* 08/09/2012 1935   GFRAA 57* 08/09/2012 1935    I No results found for this basename: SPEP,  UPEP,   kappa and lambda light chains    Lab Results  Component Value Date   WBC 4.5 10/10/2013   NEUTROABS 2.2 10/10/2013   HGB 11.6 10/10/2013   HCT 34.9 10/10/2013   MCV 92.8 10/10/2013   PLT 168 10/10/2013      Chemistry      Component Value Date/Time   NA 144 10/10/2013 1014   NA 137 08/09/2012  1935   K 4.5 10/10/2013 1014   K 4.2 08/09/2012 1935   CL 108* 11/24/2012 1043   CL 103 08/09/2012 1935   CO2 28 10/10/2013 1014   CO2 24 08/09/2012 1935   BUN 22.8 10/10/2013 1014   BUN 48* 08/09/2012 1935   CREATININE 1.0 10/10/2013 1014   CREATININE 1.13* 08/09/2012 1935      Component Value Date/Time   CALCIUM 9.8 10/10/2013 1014   CALCIUM 9.3 08/09/2012 1935   ALKPHOS 66 10/10/2013 1014   ALKPHOS 65 06/08/2012 0924   AST 22 10/10/2013 1014   AST 22 06/08/2012 0924   ALT 20 10/10/2013 1014   ALT 21 06/08/2012 0924   BILITOT 0.58 10/10/2013 1014   BILITOT 0.5 06/08/2012 0924       Lab Results  Component Value Date   LABCA2 19 06/08/2012    No components found with this basename: [id number]    No results found for this basename:  INR,  in the last 168 hours  Urinalysis No results found for this basename: colorurine,  appearanceur,  labspec,  phurine,  glucoseu,  hgbur,  bilirubinur,  ketonesur,  proteinur,  urobilinogen,  nitrite,  leukocytesur    STUDIES: No results found.  ASSESSMENT: 68 y.o. , [name] with T1 N1, stage IIA invasive ductal carcinoma of the left breast, grade II, ER 100%, PR 100%, Ki-67 of 55%, HER-2/neu negative.   1. Patient underwent a left breast lumpectomy by Dr. Donne Hazel on 06/10/2012.  A grade III, 1.2 cm invasive ductal carcinoma was removed with lymphovascular invasion, DCIS with calcifications, and comedo necrosis, , in situ carcinoma was present at the anterior margin, one lymph node was positive for metastatic mammary carcinoma.    2. Patient received adjuvant chemotherapy consisting of Docetaxel, Carboplatin, Trastuzumab x 6 cycles from 07/14/2012 through 10/27/2012.  She then completed one year of adjuvant every 3 week Trastuzumab from 11/03/2012 through 07/13/2013.    3.  Patient underwent radiation therapy from 11/29/2012 through 01/17/2013.  4.  Following radiation therapy, the patient was started on adjuvant Letrozole, 2.1m PO daily.  A total of 5  years of therapy are planned.  PLAN:   Patient is here today for evaluation of her h/o triple positive invasive ductal carcinoma of the left breast.  She is doing well today.  She is taking Letrozole daily and is tolerating it well.  She will be due for a repeat mammo and a bone density in June, 2015.  [facility]  She is also requesting port removal.  I have sent a message to Dr. WDonne Hazelregarding this.  She is otherwise doing well.  We reviewed her health maintenance in detail.  I recommended diet, exercise, and self breast exams.    The patient will return in 6 months for labs and evaluation.   She knows to call [facility] the interim for any questions or concerns.  We can certainly see her sooner if needed.  I spent 25 minutes counseling the patient face to face.  The total time spent in the appointment was 30 minutes.  LMinette Headland [id number] 4:07 PM

## 2013-10-10 NOTE — Patient Instructions (Signed)
You are doing well.  You have no sign of recurrence.  Continue taking Letrozole daily.  I will order a mammogram and bone density for June, 2014.  We will see you back in 6 months.  Please call us if you have any questions or concerns.    Breast Self-Awareness Practicing breast self-awareness may pick up problems early, prevent significant medical complications, and possibly save your life. By practicing breast self-awareness, you can become familiar with how your breasts look and feel and if your breasts are changing. This allows you to notice changes early. It can also offer you some reassurance that your breast health is good. One way to learn what is normal for your breasts and whether your breasts are changing is to do a breast self-exam. If you find a lump or something that was not present in the past, it is best to contact your caregiver right away. Other findings that should be evaluated by your caregiver include nipple discharge, especially if it is bloody; skin changes or reddening; areas where the skin seems to be pulled in (retracted); or new lumps and bumps. Breast pain is seldom associated with cancer (malignancy), but should also be evaluated by a caregiver. HOW TO PERFORM A BREAST SELF-EXAM The best time to examine your breasts is 5 7 days after your menstrual period is over. During menstruation, the breasts are lumpier, and it may be more difficult to pick up changes. If you do not menstruate, have reached menopause, or had your uterus removed (hysterectomy), you should examine your breasts at regular intervals, such as monthly. If you are breastfeeding, examine your breasts after a feeding or after using a breast pump. Breast implants do not decrease the risk for lumps or tumors, so continue to perform breast self-exams as recommended. Talk to your caregiver about how to determine the difference between the implant and breast tissue. Also, talk about the amount of pressure you should use  during the exam. Over time, you will become more familiar with the variations of your breasts and more comfortable with the exam. A breast self-exam requires you to remove all your clothes above the waist. 1. Look at your breasts and nipples. Stand in front of a mirror in a room with good lighting. With your hands on your hips, push your hands firmly downward. Look for a difference in shape, contour, and size from one breast to the other (asymmetry). Asymmetry includes puckers, dips, or bumps. Also, look for skin changes, such as reddened or scaly areas on the breasts. Look for nipple changes, such as discharge, dimpling, repositioning, or redness. 2. Carefully feel your breasts. This is best done either in the shower or tub while using soapy water or when flat on your back. Place the arm (on the side of the breast you are examining) above your head. Use the pads (not the fingertips) of your three middle fingers on your opposite hand to feel your breasts. Start in the underarm area and use  inch (2 cm) overlapping circles to feel your breast. Use 3 different levels of pressure (light, medium, and firm pressure) at each circle before moving to the next circle. The light pressure is needed to feel the tissue closest to the skin. The medium pressure will help to feel breast tissue a little deeper, while the firm pressure is needed to feel the tissue close to the ribs. Continue the overlapping circles, moving downward over the breast until you feel your ribs below your breast. Then, move  one finger-width towards the center of the body. Continue to use the  inch (2 cm) overlapping circles to feel your breast as you move slowly up toward the collar bone (clavicle) near the base of the neck. Continue the up and down exam using all 3 pressures until you reach the middle of the chest. Do this with each breast, carefully feeling for lumps or changes. 3.  Keep a written record with breast changes or normal findings for  each breast. By writing this information down, you do not need to depend only on memory for size, tenderness, or location. Write down where you are in your menstrual cycle, if you are still menstruating. Breast tissue can have some lumps or thick tissue. However, see your caregiver if you find anything that concerns you.  SEEK MEDICAL CARE IF:  You see a change in shape, contour, or size of your breasts or nipples.   You see skin changes, such as reddened or scaly areas on the breasts or nipples.   You have an unusual discharge from your nipples.   You feel a new lump or unusually thick areas.  Document Released: 05/19/2005 Document Revised: 05/05/2012 Document Reviewed: 09/03/2011 [facility] Patient Information 2014 [place].

## 2013-10-15 ENCOUNTER — Telehealth: Payer: Self-pay | Admitting: Oncology

## 2013-10-15 NOTE — Telephone Encounter (Signed)
s.w. pt and advised on June and NOV appt.Marland KitchenMarland Kitchenpt was already aware.Marland KitchenMarland Kitchenpt wants port taken out...emailed LC to add referral if ok.

## 2013-10-18 ENCOUNTER — Other Ambulatory Visit (INDEPENDENT_AMBULATORY_CARE_PROVIDER_SITE_OTHER): Payer: Self-pay | Admitting: General Surgery

## 2013-10-18 ENCOUNTER — Telehealth (INDEPENDENT_AMBULATORY_CARE_PROVIDER_SITE_OTHER): Payer: Self-pay

## 2013-10-18 NOTE — Telephone Encounter (Signed)
Called pt to see about her getting scheduled for a PAC removal. The pt definitely wants to go ahead with getting scheduled for surgery to have it removed. The pt is fine with just local anesthesia no sedation. The pt does not need a office visit she would like to go ahead with getting a surgical date. I advised pt that I would get surgical orders complete and our surgery schedulers would be calling her back to schedule.

## 2013-11-01 ENCOUNTER — Other Ambulatory Visit (INDEPENDENT_AMBULATORY_CARE_PROVIDER_SITE_OTHER): Payer: Self-pay

## 2013-11-01 DIAGNOSIS — Z452 Encounter for adjustment and management of vascular access device: Secondary | ICD-10-CM

## 2013-11-01 MED ORDER — OXYCODONE-ACETAMINOPHEN 5-325 MG PO TABS: 1.0000 | ORAL_TABLET | Freq: Four times a day (QID) | ORAL | Status: DC | PRN

## 2013-11-14 ENCOUNTER — Ambulatory Visit
Admission: RE | Admit: 2013-11-14 | Discharge: 2013-11-14 | Disposition: A | Discharge status: Home / Self Care | Payer: BC Managed Care – PPO | Source: Ambulatory Visit | Attending: Adult Health | Admitting: Adult Health

## 2013-11-14 DIAGNOSIS — Z853 Personal history of malignant neoplasm of breast: Secondary | ICD-10-CM

## 2013-11-14 DIAGNOSIS — E2839 Other primary ovarian failure: Secondary | ICD-10-CM

## 2013-11-17 ENCOUNTER — Other Ambulatory Visit (HOSPITAL_COMMUNITY): Payer: Self-pay | Admitting: Cardiology

## 2013-11-17 DIAGNOSIS — C50919 Malignant neoplasm of unspecified site of unspecified female breast: Secondary | ICD-10-CM

## 2013-11-21 ENCOUNTER — Ambulatory Visit (HOSPITAL_COMMUNITY): Payer: BC Managed Care – PPO

## 2013-12-06 ENCOUNTER — Telehealth: Payer: Self-pay

## 2013-12-06 NOTE — Telephone Encounter (Signed)
Message copied by Prentiss Bells on Tue Dec 06, 2013  4:01 PM ------      Message from: Minette Headland      Created: Tue Dec 06, 2013  3:35 PM       Please call patient with results.  Recommend calcium BID, Vitamin D3 daily, and we will discuss more therapy at her next appt in 04/2014.              Thanks,      Rowland       ----- Message -----         From: Rad Results In Interface         Sent: 11/14/2013   1:21 PM           To: Minette Headland, NP                   ------

## 2013-12-06 NOTE — Telephone Encounter (Signed)
LMOVM - pt to return call to clinic for results.

## 2013-12-07 ENCOUNTER — Telehealth: Payer: Self-pay | Admitting: *Deleted

## 2013-12-07 NOTE — Telephone Encounter (Signed)
This RN informed patient of bone density results. Per Mendel Ryder, NP, she would like for patient to start taking Calcium and Vitamin D3. Patient verbalized understanding.

## 2013-12-08 ENCOUNTER — Ambulatory Visit (HOSPITAL_COMMUNITY)
Admission: RE | Admit: 2013-12-08 | Discharge: 2013-12-08 | Disposition: A | Discharge status: Home / Self Care | Payer: BC Managed Care – PPO | Source: Ambulatory Visit | Attending: Oncology | Admitting: Oncology

## 2013-12-08 DIAGNOSIS — I079 Rheumatic tricuspid valve disease, unspecified: Secondary | ICD-10-CM | POA: Insufficient documentation

## 2013-12-08 DIAGNOSIS — R0602 Shortness of breath: Secondary | ICD-10-CM | POA: Insufficient documentation

## 2013-12-08 DIAGNOSIS — I10 Essential (primary) hypertension: Secondary | ICD-10-CM | POA: Insufficient documentation

## 2013-12-08 DIAGNOSIS — R079 Chest pain, unspecified: Secondary | ICD-10-CM | POA: Insufficient documentation

## 2013-12-08 DIAGNOSIS — R011 Cardiac murmur, unspecified: Secondary | ICD-10-CM | POA: Insufficient documentation

## 2013-12-08 DIAGNOSIS — I369 Nonrheumatic tricuspid valve disorder, unspecified: Secondary | ICD-10-CM

## 2013-12-08 DIAGNOSIS — C50919 Malignant neoplasm of unspecified site of unspecified female breast: Secondary | ICD-10-CM | POA: Insufficient documentation

## 2013-12-08 NOTE — Progress Notes (Signed)
*  PRELIMINARY RESULTS* Echocardiogram 2D Echocardiogram has been performed.  Leavy Cella 12/08/2013, 9:32 AM

## 2013-12-27 ENCOUNTER — Telehealth (HOSPITAL_COMMUNITY): Payer: Self-pay | Admitting: Vascular Surgery

## 2013-12-27 NOTE — Telephone Encounter (Addendum)
Pt wants Echo results.. PT CALLED AGAIN TODAY  Please advise

## 2013-12-28 NOTE — Telephone Encounter (Signed)
ECHO results reviewed with patient.  Aware and appreciative. Renee Pain

## 2014-02-18 ENCOUNTER — Other Ambulatory Visit: Payer: Self-pay | Admitting: Adult Health

## 2014-03-01 IMAGING — CT CT ANGIO CHEST
2 of 6 series · 18 of 46 positions shown · IV contrast (APPLIED)
Comparison: None.

CLINICAL DATA: Personal history of breast carcinoma, recent chest
pain and shortness of breath

CT ANGIOGRAPHY CHEST
TECHNIQUE: Multidetector CT imaging of the chest using the
standard protocol during bolus administration of intravenous
contrast. Multiplanar reconstructed images including MIPs were
obtained and reviewed to evaluate the vascular anatomy.
Contrast: 100mL OMNIPAQUE IOHEXOL 350 MG/ML SOLN

[Series 6: pulm embolism 1.0 b25f thin · axial · 0.58mm/px · z∈[-187,-8]mm · 15 of 197 slices shown]
[im 9/197  lung]
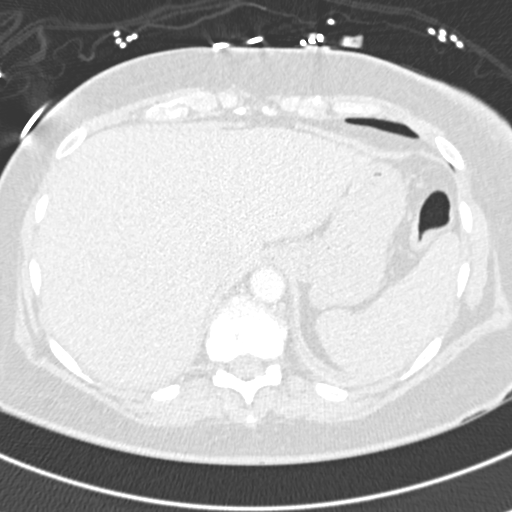
[im 26/197  soft-tissue]
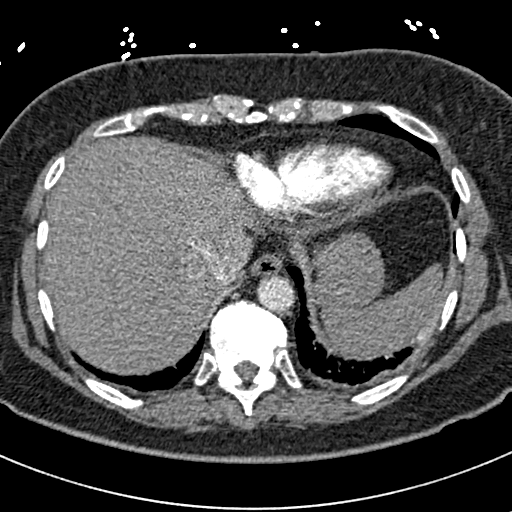
[im 35/197  lung]
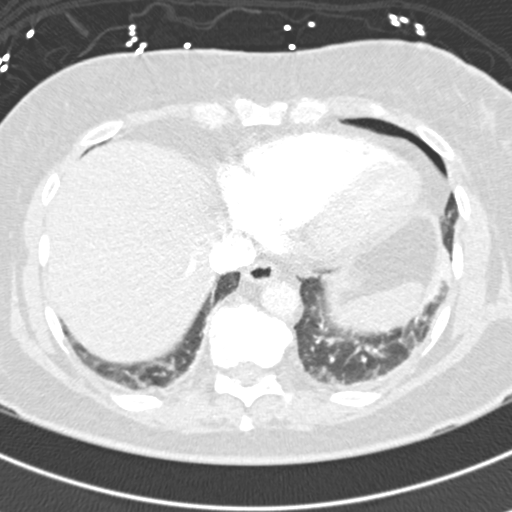
[im 52/197  soft-tissue]
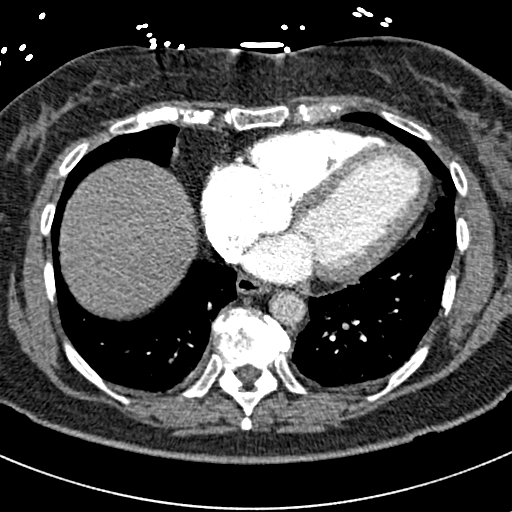
[im 60/197  lung]
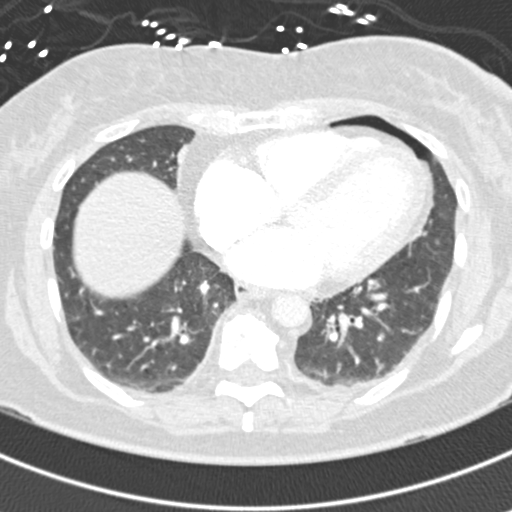
[im 77/197  soft-tissue]
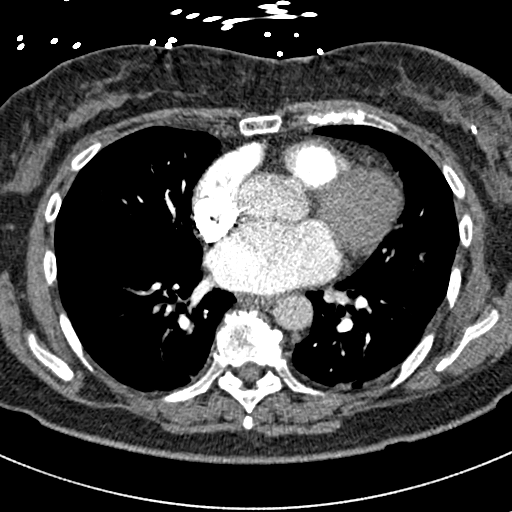
[im 86/197  lung]
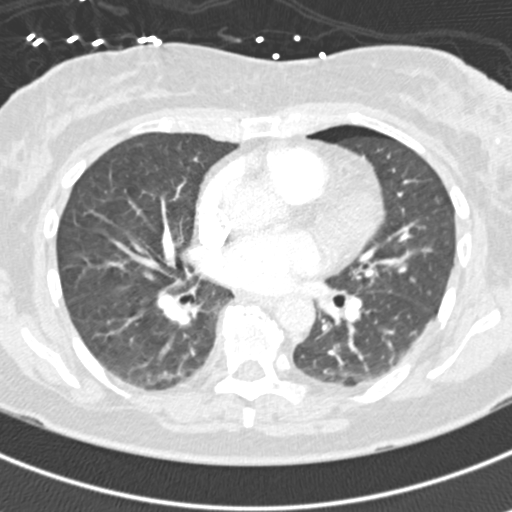
[im 103/197  soft-tissue]
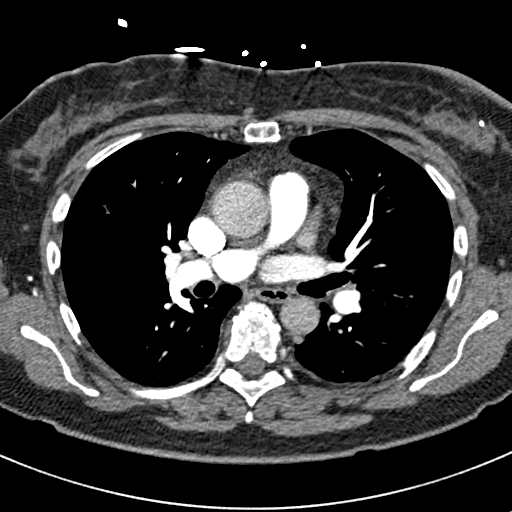
[im 111/197  lung]
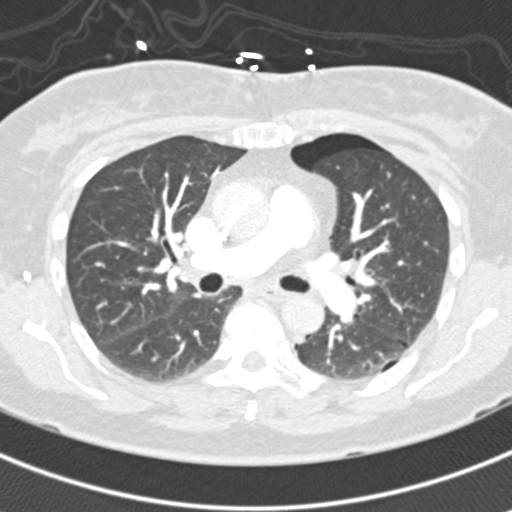
[im 120/197  soft-tissue]
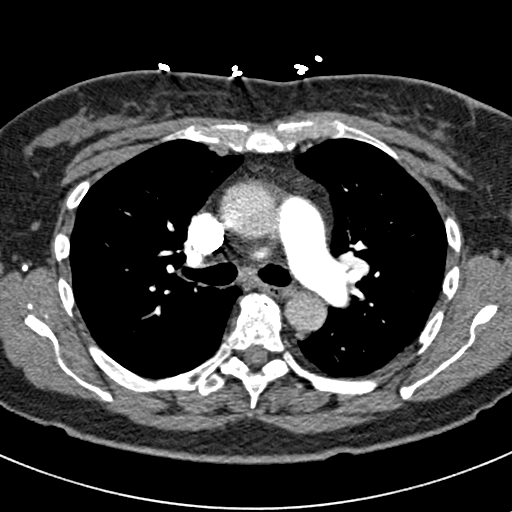
[im 137/197  lung]
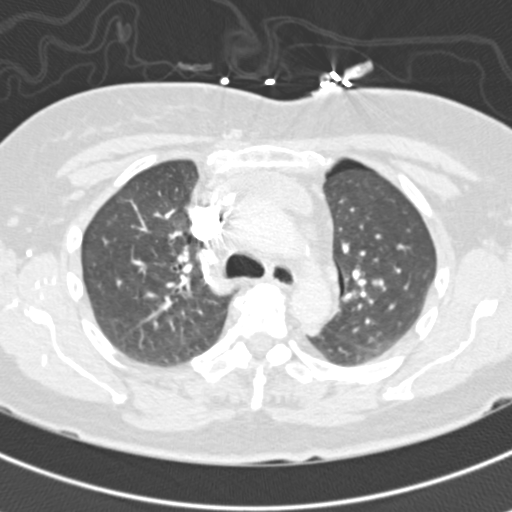
[im 145/197  soft-tissue]
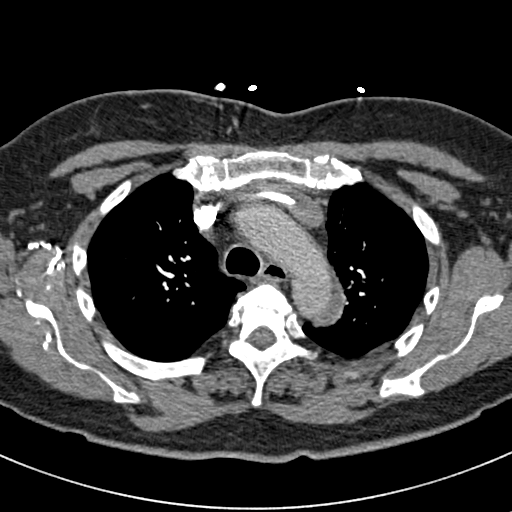
[im 162/197  lung]
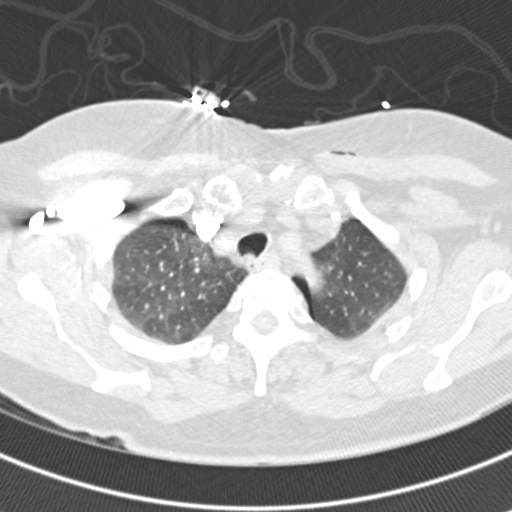
[im 171/197  soft-tissue]
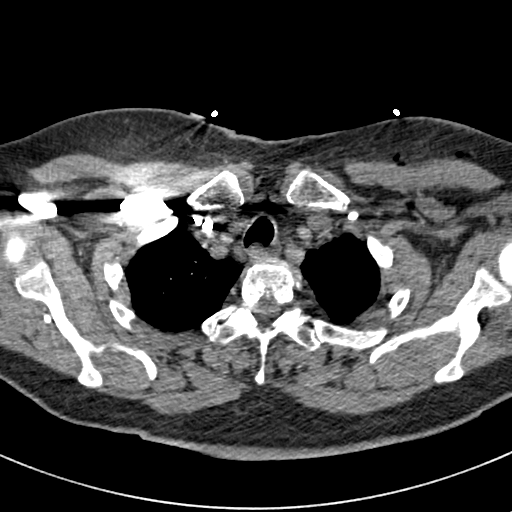
[im 188/197  lung]
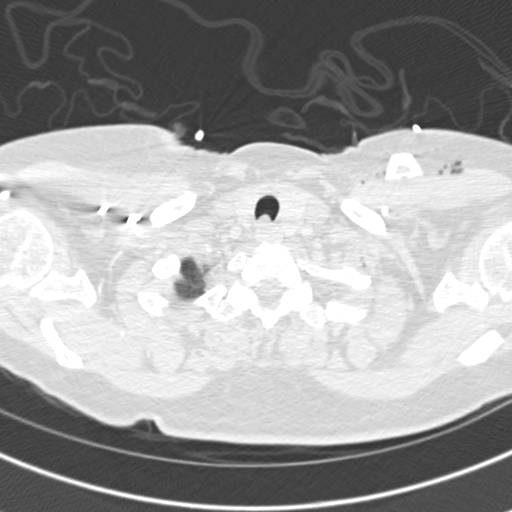

[Series 8: coronals · coronal · 0.42mm/px · 3 of 90 slices shown]
[im 23/90  soft-tissue]
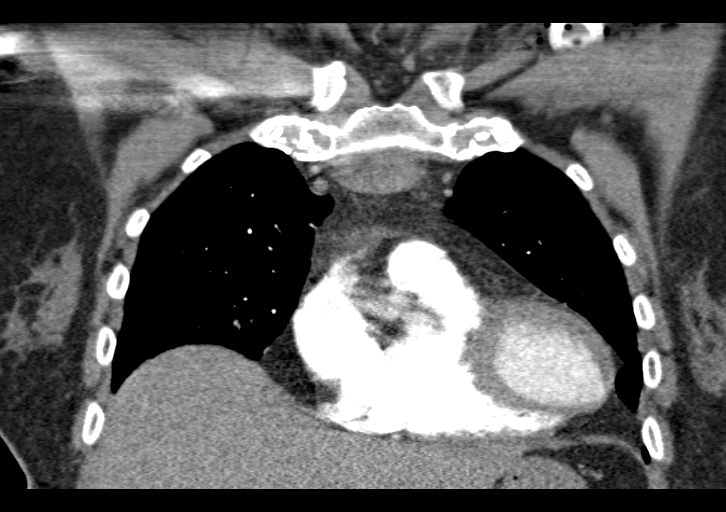
[im 45/90  soft-tissue]
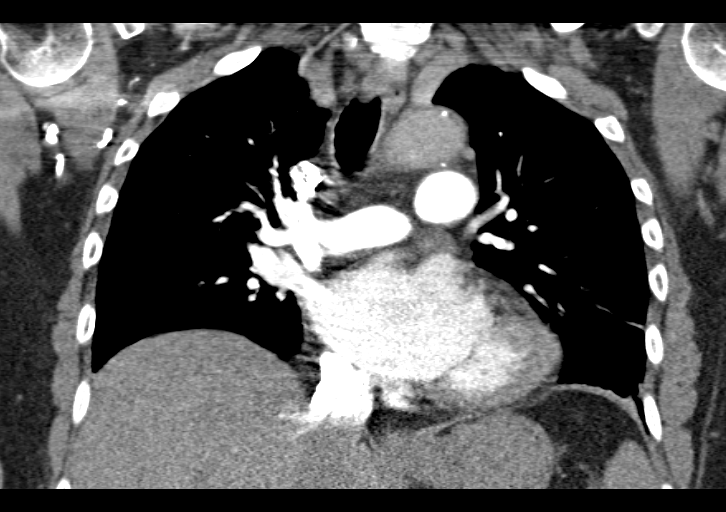
[im 67/90  soft-tissue]
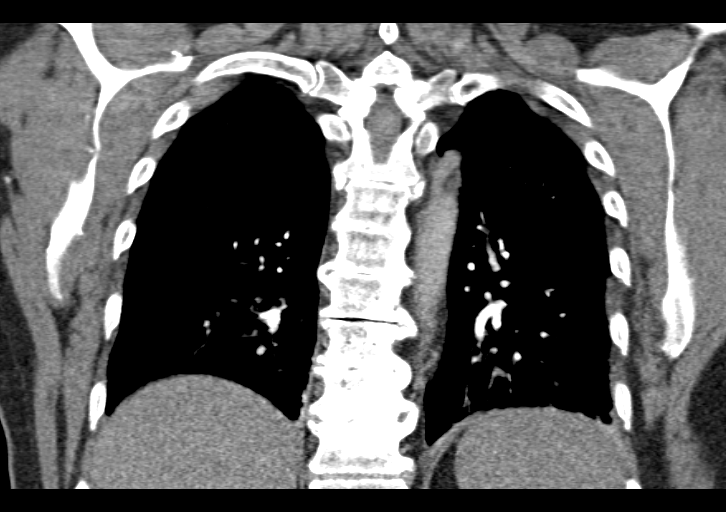

[18 of 46 positions shown; findings below may reference images not displayed]

FINDINGS: The right lung is well aerated without evidence of focal
infiltrate or sizable effusion.  There is a mild pneumothorax noted
on the left involving predominately the upper lobe.  There is noted
within the subcutaneous tissues surrounding a left chest wall port
and this pneumothorax is likely related to the recent port
placement.  This was not well visualized on the prior post
placement film.

The pulmonary artery is well visualized bilaterally demonstrates
normal branching pattern.  No filling defects to suggest pulmonary
emboli are seen.  Visualized upper abdomen is unremarkable.  The
osseous structures are grossly unremarkable as well.
IMPRESSION: Small left-sided pneumothorax likely related to the recent port
placement.  This was not as well visualized on the previous post
port placement film.

No evidence of pulmonary embolism.

No other focal abnormality is seen.

## 2014-03-10 ENCOUNTER — Telehealth: Payer: Self-pay | Admitting: Adult Health

## 2014-03-10 NOTE — Telephone Encounter (Signed)
, °

## 2014-03-13 ENCOUNTER — Encounter: Payer: Self-pay | Admitting: Internal Medicine

## 2014-04-12 ENCOUNTER — Other Ambulatory Visit: Payer: BC Managed Care – PPO

## 2014-04-12 ENCOUNTER — Ambulatory Visit: Payer: BC Managed Care – PPO | Admitting: Adult Health

## 2014-04-18 ENCOUNTER — Other Ambulatory Visit: Payer: Self-pay | Admitting: *Deleted

## 2014-04-18 DIAGNOSIS — C50112 Malignant neoplasm of central portion of left female breast: Secondary | ICD-10-CM

## 2014-04-19 ENCOUNTER — Ambulatory Visit (HOSPITAL_BASED_OUTPATIENT_CLINIC_OR_DEPARTMENT_OTHER): Payer: BC Managed Care – PPO | Admitting: Adult Health

## 2014-04-19 ENCOUNTER — Telehealth: Payer: Self-pay | Admitting: Adult Health

## 2014-04-19 ENCOUNTER — Encounter: Payer: Self-pay | Admitting: Adult Health

## 2014-04-19 ENCOUNTER — Other Ambulatory Visit (HOSPITAL_BASED_OUTPATIENT_CLINIC_OR_DEPARTMENT_OTHER): Payer: BC Managed Care – PPO

## 2014-04-19 VITALS — BP 125/57 | HR 66 | Temp 98.1°F | Resp 18 | Ht 62.0 in | Wt 170.1 lb

## 2014-04-19 DIAGNOSIS — C50212 Malignant neoplasm of upper-inner quadrant of left female breast: Secondary | ICD-10-CM

## 2014-04-19 DIAGNOSIS — C50112 Malignant neoplasm of central portion of left female breast: Secondary | ICD-10-CM

## 2014-04-19 DIAGNOSIS — C50419 Malignant neoplasm of upper-outer quadrant of unspecified female breast: Secondary | ICD-10-CM

## 2014-04-19 DIAGNOSIS — Z170 Estrogen receptor positive status [ER+]: Secondary | ICD-10-CM

## 2014-04-19 LAB — CBC WITH DIFFERENTIAL/PLATELET
BASO%: 0.4 % (ref 0.0–2.0)
BASOS ABS: 0 10*3/uL (ref 0.0–0.1)
EOS%: 2.1 % (ref 0.0–7.0)
Eosinophils Absolute: 0.1 10*3/uL (ref 0.0–0.5)
HCT: 35.8 % (ref 34.8–46.6)
HGB: 11.7 g/dL (ref 11.6–15.9)
LYMPH#: 2.1 10*3/uL (ref 0.9–3.3)
LYMPH%: 38.9 % (ref 14.0–49.7)
MCH: 31.1 pg (ref 25.1–34.0)
MCHC: 32.7 g/dL (ref 31.5–36.0)
MCV: 95.2 fL (ref 79.5–101.0)
MONO#: 0.4 10*3/uL (ref 0.1–0.9)
MONO%: 7.2 % (ref 0.0–14.0)
NEUT%: 51.4 % (ref 38.4–76.8)
NEUTROS ABS: 2.7 10*3/uL (ref 1.5–6.5)
PLATELETS: 182 10*3/uL (ref 145–400)
RBC: 3.76 10*6/uL (ref 3.70–5.45)
RDW: 12.9 % (ref 11.2–14.5)
WBC: 5.3 10*3/uL (ref 3.9–10.3)

## 2014-04-19 LAB — COMPREHENSIVE METABOLIC PANEL (CC13)
ALK PHOS: 69 U/L (ref 40–150)
ALT: 24 U/L (ref 0–55)
AST: 25 U/L (ref 5–34)
Albumin: 3.9 g/dL (ref 3.5–5.0)
Anion Gap: 9 mEq/L (ref 3–11)
BUN: 27.7 mg/dL — AB (ref 7.0–26.0)
CHLORIDE: 106 meq/L (ref 98–109)
CO2: 28 mEq/L (ref 22–29)
CREATININE: 1.2 mg/dL — AB (ref 0.6–1.1)
Calcium: 10 mg/dL (ref 8.4–10.4)
Glucose: 95 mg/dl (ref 70–140)
Potassium: 4.6 mEq/L (ref 3.5–5.1)
Sodium: 143 mEq/L (ref 136–145)
Total Bilirubin: 0.54 mg/dL (ref 0.20–1.20)
Total Protein: 7 g/dL (ref 6.4–8.3)

## 2014-04-19 NOTE — Progress Notes (Signed)
ID: [name] OB: 02-Sep-1945  MR#: [id number]  PYK#:[id number]  PCP: Wenda Low, MD GYN:   SU: Dr. Rolm Bookbinder OTHER MD: Dr. Isidore Moos, radiation oncology  CHIEF COMPLAINT:  68 y/o [place], [name] with T1 N1, stage IIA invasive ductal carcinoma of the left breast, grade II, ER 100%, PR 100%, Ki-67 of 55%, HER-2/neu negative.   BREAST CANCER HISTORY:  Patient palpated a left breast mass and underwent a mammogram in 05/2012 that demonstrated the mass.  It was biopsied on 05/14/12 and the pathology revealed triple positive, grade II, uinvasive ductal carcinoma with a Ki-67 of 55%  CURRENT THERAPY: Letrozole 2.5 mg daily  INTERVAL HISTORY: [name] is a 68 year old female with triple positive invasive ductal carcinoma of the left breast.  She is here today for follow up.  She is taking Letrozole daily and continues to tolerate it very well.  She says she doesn't notice that she is taking it.  She is otherwise well and without questions or concerns.  We updated her health maintenance below.     REVIEW OF SYSTEMS:  A 10 point review of systems was conducted and is otherwise negative except for what is noted above.     PAST MEDICAL HISTORY: Past Medical History  Diagnosis Date  . Hyperlipidemia   . Invasive ductal carcinoma of breast 2013    Left  . Arthritis   . Depression   . Heart murmur     heard one when she was pregnamt-maybe had echo-cannot remember  . Hypertension   . GERD (gastroesophageal reflux disease)   . Status post chemotherapy     chemotherapy consisting of Stanton on 07/14/12 with 6 cycles planned. Every 3 week Herceptin starting 11/03/12.  . S/P radiation therapy  6-30 to 01-12-13                                                       6-30 to 01-12-13                                                          , Left Breast boost / 10 Gy in 5 fractions                    . Iron deficiency anemia   . Hyperplastic colon polyp     PAST SURGICAL HISTORY: Past  Surgical History  Procedure Laterality Date  . Tubal ligation  1988  . Bladder suspension  10/2010  . Tonsillectomy and adenoidectomy      age 64  . Abdominal adhesion surgery  1980  . Benign breast biopsy  1994    Left  . Breast lumpectomy with needle localization and axillary sentinel lymph node bx  06/10/2012    Procedure: BREAST LUMPECTOMY WITH NEEDLE LOCALIZATION AND AXILLARY SENTINEL LYMPH NODE BX;  Surgeon: Rolm Bookbinder, MD;  Location: [facility];  Service: General;  Laterality: Left;  . Appendectomy    . Cholecystectomy    . Portacath placement  07/07/2012    Procedure: INSERTION PORT-A-CATH;  Surgeon: Rolm Bookbinder, MD;  Location: [facility];  Service: General;  Laterality: Left;  Port a cath insertion    FAMILY HISTORY Family History  Problem Relation Age of Onset  . Colon cancer Mother 78  . Breast cancer Sister 62  . Prostate cancer Father 55  . Stomach cancer Maternal Grandmother 67    GYNECOLOGIC HISTORY: (updated 04/19/2014) Menarche at age 14, G59 P2, patient took oral HRT x 2 years in the 67s.  Patient has not had any abnormal pap smears, she has no h/o sexually transmitted infections.   SOCIAL HISTORY: (Updated 04/19/2014) Lives with boyfriend [name] in a town home in [place], [name].  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      ADVANCED DIRECTIVES:  (Updated 04/19/2014) In place.  Health care power of attorney is [name] her daughter, We do not have copies, and the patient will bring these in for [place] to scan.  Dr. Glenna Durand office does have copies.     HEALTH MAINTENANCE: History  Substance Use Topics  . Smoking status: Never Smoker   . Smokeless tobacco: Never Used  . Alcohol Use: 0.6 oz/week    1 Glasses of wine per week     Comment: 1 glass of Wine per Week      Mammogram: 11/08/2013 Colonoscopy: 2013, 5 year f/u recommended Bone Density Scan:  10/2013 Pap Smear:  Unsure when last one was Eye Exam: 2 years ago  Vitamin D Level:  Next appt Lipid Panel: 2013   No Known Allergies  Current Outpatient Prescriptions  Medication Sig Dispense Refill  . Ascorbic Acid (VITAMIN C) 1000 MG tablet Take 1,000 mg by mouth daily.      Marland Kitchen aspirin 81 MG tablet Take 81 mg by mouth daily.    Marland Kitchen atenolol (TENORMIN) 50 MG tablet Take 50 mg by mouth daily.    . Cholecalciferol (VITAMIN D) 2000 UNITS tablet Take 2,000 Units by mouth daily.    Marland Kitchen letrozole (FEMARA) 2.5 MG tablet TAKE ONE TABLET BY MOUTH ONCE DAILY 30 tablet 2  . olmesartan-hydrochlorothiazide (BENICAR HCT) 20-12.5 MG per tablet Take 1 tablet by mouth daily.    Marland Kitchen omeprazole (PRILOSEC) 40 MG capsule Take 40 mg by mouth daily.    . simvastatin (ZOCOR) 80 MG tablet Take 40 mg by mouth at bedtime.     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 1 Units 0  . vitamin E 400 UNIT capsule Take 400 Units by mouth daily.       No current facility-administered medications for this visit.    OBJECTIVE: Filed Vitals:   04/19/14 1346  BP: 125/57  Pulse: 66  Temp: 98.1 F (36.7 C)  Resp: 18     Body mass index is 31.1 kg/(m^2).      GENERAL: Patient is a well appearing female in no acute distress HEENT:  Sclerae anicteric.  Oropharynx clear and moist. No ulcerations or evidence of oropharyngeal candidiasis. Neck is supple.  NODES:  No cervical, supraclavicular, or axillary lymphadenopathy palpated.  BREAST EXAM:  Right breast, no masses, nodules, skin or nipple changes, left breast s/p lumpectomy, Radiation thickness noted, no nodularity, or sign of recurrence. Benign bilateral breast exam.   LUNGS:  Clear to auscultation bilaterally.  No wheezes or rhonchi. HEART:  Regular rate and rhythm. No murmur appreciated. ABDOMEN:  Soft, nontender.  Positive, normoactive bowel sounds. No organomegaly palpated. MSK:  No focal spinal tenderness to palpation. Full range of  motion bilaterally in  the upper extremities. EXTREMITIES:  No peripheral edema.   SKIN:  Clear with no obvious rashes or skin changes. No nail dyscrasia. NEURO:  Nonfocal. Well oriented.  Appropriate affect. ECOG FS:1 - Symptomatic but completely ambulatory  LAB RESULTS:  CMP     Component Value Date/Time   NA 143 04/19/2014 1337   NA 137 08/09/2012 1935   K 4.6 04/19/2014 1337   K 4.2 08/09/2012 1935   CL 108* 11/24/2012 1043   CL 103 08/09/2012 1935   CO2 28 04/19/2014 1337   CO2 24 08/09/2012 1935   GLUCOSE 95 04/19/2014 1337   GLUCOSE 109* 11/24/2012 1043   GLUCOSE 136* 08/09/2012 1935   BUN 27.7* 04/19/2014 1337   BUN 48* 08/09/2012 1935   CREATININE 1.2* 04/19/2014 1337   CREATININE 1.13* 08/09/2012 1935   CALCIUM 10.0 04/19/2014 1337   CALCIUM 9.3 08/09/2012 1935   PROT 7.0 04/19/2014 1337   PROT 7.5 06/08/2012 0924   ALBUMIN 3.9 04/19/2014 1337   ALBUMIN 4.0 06/08/2012 0924   AST 25 04/19/2014 1337   AST 22 06/08/2012 0924   ALT 24 04/19/2014 1337   ALT 21 06/08/2012 0924   ALKPHOS 69 04/19/2014 1337   ALKPHOS 65 06/08/2012 0924   BILITOT 0.54 04/19/2014 1337   BILITOT 0.5 06/08/2012 0924   GFRNONAA 49* 08/09/2012 1935   GFRAA 57* 08/09/2012 1935    I No results found for: SPEP  Lab Results  Component Value Date   WBC 5.3 04/19/2014   NEUTROABS 2.7 04/19/2014   HGB 11.7 04/19/2014   HCT 35.8 04/19/2014   MCV 95.2 04/19/2014   PLT 182 04/19/2014      Chemistry      Component Value Date/Time   NA 143 04/19/2014 1337   NA 137 08/09/2012 1935   K 4.6 04/19/2014 1337   K 4.2 08/09/2012 1935   CL 108* 11/24/2012 1043   CL 103 08/09/2012 1935   CO2 28 04/19/2014 1337   CO2 24 08/09/2012 1935   BUN 27.7* 04/19/2014 1337   BUN 48* 08/09/2012 1935   CREATININE 1.2* 04/19/2014 1337   CREATININE 1.13* 08/09/2012 1935      Component Value Date/Time   CALCIUM 10.0 04/19/2014 1337   CALCIUM 9.3 08/09/2012 1935   ALKPHOS 69 04/19/2014 1337    ALKPHOS 65 06/08/2012 0924   AST 25 04/19/2014 1337   AST 22 06/08/2012 0924   ALT 24 04/19/2014 1337   ALT 21 06/08/2012 0924   BILITOT 0.54 04/19/2014 1337   BILITOT 0.5 06/08/2012 0924       Lab Results  Component Value Date   LABCA2 19 06/08/2012    No components found for: [id number]  No results for input(s): INR in the last 168 hours.  Urinalysis No results found for: COLORURINE  STUDIES: No results found.  ASSESSMENT: 68 y.o. [place], [name] with T1 N1, stage IIA invasive ductal carcinoma of the left breast, grade II, ER 100%, PR 100%, Ki-67 of 55%, HER-2/neu negative.   1. Patient underwent a left breast lumpectomy by Dr. Donne Hazel on 06/10/2012.  A grade III, 1.2 cm invasive ductal carcinoma was removed with lymphovascular invasion, DCIS with calcifications, and comedo necrosis, , in situ carcinoma was present at the anterior margin, one lymph node was positive for metastatic mammary carcinoma.    2. Patient received adjuvant chemotherapy consisting of Docetaxel, Carboplatin, Trastuzumab x 6 cycles from 07/14/2012 through 10/27/2012.  She then completed one year of adjuvant every 3 week Trastuzumab  from 11/03/2012 through 07/13/2013.    3.  Patient underwent radiation therapy from 11/29/2012 through 01/17/2013.  4.  Following radiation therapy, the patient was started on adjuvant Letrozole, 2.49m PO daily.  A total of 5 years of therapy are planned.  PLAN:  JDayleeis doing very well today.  She has no sign of recurrence and is tolerating daily letrozole without difficulty.  I reviewed her CBC with her which is normal.  She will continue taking Letrozole daily.  JMarcie Baland I reviewed her bone density results from 10/2013 in more detail than we could over the phone.  She has been taking calcium and vitamin d, and performing weight bearing exercises.  This will be due in 11/2015 and should her bone density continue to worsen, we will discuss bisphosphanate therapy at that time.     JJessilynnwill return in 6 months for labs and evaluation.   She knows to call u[place]ain the interim for any questions or concerns.  We can certainly see her sooner if needed.  I spent 25 minutes counseling the patient face to face.  The total time spent in the appointment was 30 minutes.  LMinette Headland [id number]/18/2015 2:38 PM

## 2014-04-19 NOTE — Patient Instructions (Signed)
You are doing very well.  You have no sign of recurrence.  Continue taking Letrozole daily.  I recommend healthy diet, calcium, vitamin d intake, weight bearing exercise, and monthly breast exams.  Please call us if you have any questions or concerns.    We will see you back in 6 months.    Breast Self-Awareness Practicing breast self-awareness may pick up problems early, prevent significant medical complications, and possibly save your life. By practicing breast self-awareness, you can become familiar with how your breasts look and feel and if your breasts are changing. This allows you to notice changes early. It can also offer you some reassurance that your breast health is good. One way to learn what is normal for your breasts and whether your breasts are changing is to do a breast self-exam. If you find a lump or something that was not present in the past, it is best to contact your caregiver right away. Other findings that should be evaluated by your caregiver include nipple discharge, especially if it is bloody; skin changes or reddening; areas where the skin seems to be pulled in (retracted); or new lumps and bumps. Breast pain is seldom associated with cancer (malignancy), but should also be evaluated by a caregiver. HOW TO PERFORM A BREAST SELF-EXAM The best time to examine your breasts is 5-7 days after your menstrual period is over. During menstruation, the breasts are lumpier, and it may be more difficult to pick up changes. If you do not menstruate, have reached menopause, or had your uterus removed (hysterectomy), you should examine your breasts at regular intervals, such as monthly. If you are breastfeeding, examine your breasts after a feeding or after using a breast pump. Breast implants do not decrease the risk for lumps or tumors, so continue to perform breast self-exams as recommended. Talk to your caregiver about how to determine the difference between the implant and breast tissue.  Also, talk about the amount of pressure you should use during the exam. Over time, you will become more familiar with the variations of your breasts and more comfortable with the exam. A breast self-exam requires you to remove all your clothes above the waist. 1. Look at your breasts and nipples. Stand in front of a mirror in a room with good lighting. With your hands on your hips, push your hands firmly downward. Look for a difference in shape, contour, and size from one breast to the other (asymmetry). Asymmetry includes puckers, dips, or bumps. Also, look for skin changes, such as reddened or scaly areas on the breasts. Look for nipple changes, such as discharge, dimpling, repositioning, or redness. 2. Carefully feel your breasts. This is best done either in the shower or tub while using soapy water or when flat on your back. Place the arm (on the side of the breast you are examining) above your head. Use the pads (not the fingertips) of your three middle fingers on your opposite hand to feel your breasts. Start in the underarm area and use  inch (2 cm) overlapping circles to feel your breast. Use 3 different levels of pressure (light, medium, and firm pressure) at each circle before moving to the next circle. The light pressure is needed to feel the tissue closest to the skin. The medium pressure will help to feel breast tissue a little deeper, while the firm pressure is needed to feel the tissue close to the ribs. Continue the overlapping circles, moving downward over the breast until you feel your  ribs below your breast. Then, move one finger-width towards the center of the body. Continue to use the  inch (2 cm) overlapping circles to feel your breast as you move slowly up toward the collar bone (clavicle) near the base of the neck. Continue the up and down exam using all 3 pressures until you reach the middle of the chest. Do this with each breast, carefully feeling for lumps or changes. 3.  Keep a  written record with breast changes or normal findings for each breast. By writing this information down, you do not need to depend only on memory for size, tenderness, or location. Write down where you are in your menstrual cycle, if you are still menstruating. Breast tissue can have some lumps or thick tissue. However, see your caregiver if you find anything that concerns you.  SEEK MEDICAL CARE IF:  You see a change in shape, contour, or size of your breasts or nipples.   You see skin changes, such as reddened or scaly areas on the breasts or nipples.   You have an unusual discharge from your nipples.   You feel a new lump or unusually thick areas.  Document Released: 05/19/2005 Document Revised: 05/05/2012 Document Reviewed: 09/03/2011 [facility] Patient Information 2015 [place], [place]. This information is not intended to replace advice given to you by your health care provider. Make sure you discuss any questions you have with your health care provider.

## 2014-04-19 NOTE — Telephone Encounter (Signed)
per pof to sch pt appt-gave pt copy of sch °

## 2014-05-29 ENCOUNTER — Other Ambulatory Visit: Payer: Self-pay | Admitting: *Deleted

## 2014-05-29 DIAGNOSIS — C50419 Malignant neoplasm of upper-outer quadrant of unspecified female breast: Secondary | ICD-10-CM

## 2014-05-29 MED ORDER — LETROZOLE 2.5 MG PO TABS
2.5000 mg | ORAL_TABLET | Freq: Every day | ORAL | Status: DC
Start: 2014-05-29 — End: 2014-11-01

## 2014-07-05 ENCOUNTER — Telehealth: Payer: Self-pay | Admitting: Hematology

## 2014-07-05 NOTE — Telephone Encounter (Addendum)
Left message to confirm appointment change from 05/25 to 05/26. Mailed calendar

## 2014-07-25 ENCOUNTER — Telehealth: Payer: Self-pay | Admitting: Hematology

## 2014-07-25 NOTE — Telephone Encounter (Signed)
Returned voice mail to cancel appointment. Left voice mail to see if patient is needing to reschedule.

## 2014-07-28 ENCOUNTER — Telehealth: Payer: Self-pay | Admitting: Hematology

## 2014-07-28 NOTE — Telephone Encounter (Signed)
Left message to confirm appointment for June 1. Mailed calendar.

## 2014-10-18 ENCOUNTER — Other Ambulatory Visit: Payer: Self-pay | Admitting: Internal Medicine

## 2014-10-18 DIAGNOSIS — Z853 Personal history of malignant neoplasm of breast: Secondary | ICD-10-CM

## 2014-10-25 ENCOUNTER — Ambulatory Visit: Payer: BC Managed Care – PPO | Admitting: Hematology

## 2014-10-25 ENCOUNTER — Other Ambulatory Visit: Payer: BC Managed Care – PPO

## 2014-10-26 ENCOUNTER — Ambulatory Visit: Payer: Self-pay | Admitting: Hematology

## 2014-10-26 ENCOUNTER — Other Ambulatory Visit: Payer: Self-pay | Admitting: *Deleted

## 2014-10-26 ENCOUNTER — Other Ambulatory Visit: Payer: Self-pay

## 2014-10-26 DIAGNOSIS — C50412 Malignant neoplasm of upper-outer quadrant of left female breast: Secondary | ICD-10-CM

## 2014-10-26 DIAGNOSIS — C50912 Malignant neoplasm of unspecified site of left female breast: Secondary | ICD-10-CM

## 2014-11-01 ENCOUNTER — Ambulatory Visit (HOSPITAL_BASED_OUTPATIENT_CLINIC_OR_DEPARTMENT_OTHER): Payer: BLUE CROSS/BLUE SHIELD | Admitting: Hematology

## 2014-11-01 ENCOUNTER — Telehealth: Payer: Self-pay | Admitting: Hematology

## 2014-11-01 ENCOUNTER — Encounter: Payer: Self-pay | Admitting: Hematology

## 2014-11-01 ENCOUNTER — Other Ambulatory Visit (HOSPITAL_BASED_OUTPATIENT_CLINIC_OR_DEPARTMENT_OTHER): Payer: BLUE CROSS/BLUE SHIELD

## 2014-11-01 VITALS — BP 142/61 | HR 61 | Temp 97.6°F | Resp 18 | Ht 62.0 in | Wt 172.9 lb

## 2014-11-01 DIAGNOSIS — Z79811 Long term (current) use of aromatase inhibitors: Secondary | ICD-10-CM

## 2014-11-01 DIAGNOSIS — Z170 Estrogen receptor positive status [ER+]: Secondary | ICD-10-CM

## 2014-11-01 DIAGNOSIS — C50412 Malignant neoplasm of upper-outer quadrant of left female breast: Secondary | ICD-10-CM

## 2014-11-01 DIAGNOSIS — C50912 Malignant neoplasm of unspecified site of left female breast: Secondary | ICD-10-CM

## 2014-11-01 DIAGNOSIS — M859 Disorder of bone density and structure, unspecified: Secondary | ICD-10-CM | POA: Diagnosis not present

## 2014-11-01 DIAGNOSIS — C50419 Malignant neoplasm of upper-outer quadrant of unspecified female breast: Secondary | ICD-10-CM

## 2014-11-01 LAB — CBC WITH DIFFERENTIAL/PLATELET
BASO%: 0.8 % (ref 0.0–2.0)
Basophils Absolute: 0 10e3/uL (ref 0.0–0.1)
EOS%: 2.3 % (ref 0.0–7.0)
Eosinophils Absolute: 0.1 10e3/uL (ref 0.0–0.5)
HCT: 36.6 % (ref 34.8–46.6)
HGB: 12 g/dL (ref 11.6–15.9)
LYMPH%: 39 % (ref 14.0–49.7)
MCH: 30.2 pg (ref 25.1–34.0)
MCHC: 32.7 g/dL (ref 31.5–36.0)
MCV: 92.5 fL (ref 79.5–101.0)
MONO#: 0.5 10e3/uL (ref 0.1–0.9)
MONO%: 10.5 % (ref 0.0–14.0)
NEUT#: 2.3 10e3/uL (ref 1.5–6.5)
NEUT%: 47.4 % (ref 38.4–76.8)
Platelets: 156 10e3/uL (ref 145–400)
RBC: 3.96 10e6/uL (ref 3.70–5.45)
RDW: 13.8 % (ref 11.2–14.5)
WBC: 4.8 10e3/uL (ref 3.9–10.3)
lymph#: 1.9 10e3/uL (ref 0.9–3.3)

## 2014-11-01 LAB — COMPREHENSIVE METABOLIC PANEL (CC13)
ALT: 22 U/L (ref 0–55)
AST: 24 U/L (ref 5–34)
Albumin: 3.7 g/dL (ref 3.5–5.0)
Alkaline Phosphatase: 66 U/L (ref 40–150)
Anion Gap: 8 mEq/L (ref 3–11)
BILIRUBIN TOTAL: 0.72 mg/dL (ref 0.20–1.20)
BUN: 17.5 mg/dL (ref 7.0–26.0)
CALCIUM: 9.8 mg/dL (ref 8.4–10.4)
CO2: 27 mEq/L (ref 22–29)
Chloride: 108 mEq/L (ref 98–109)
Creatinine: 1 mg/dL (ref 0.6–1.1)
EGFR: 59 mL/min/{1.73_m2} — ABNORMAL LOW (ref >90)
Glucose: 92 mg/dl (ref 70–140)
Potassium: 5 mEq/L (ref 3.5–5.1)
Sodium: 143 mEq/L (ref 136–145)
Total Protein: 6.7 g/dL (ref 6.4–8.3)

## 2014-11-01 MED ORDER — LETROZOLE 2.5 MG PO TABS: 2.5000 mg | ORAL_TABLET | Freq: Every day | ORAL | Status: DC

## 2014-11-01 NOTE — Progress Notes (Signed)
[facility] follow-up note  ID: [name] OB: [birth year]  MR#: [id number]  FXT#:[id number]  PCP: Wenda Low, MD GYN:   SU: Dr. Rolm Bookbinder OTHER MD: Dr. Isidore Moos, radiation oncology  CHIEF COMPLAINT:  69 y/o [place], [name] with T1 N1, stage IIA invasive ductal carcinoma of the left breast, grade II, ER 100%, PR 100%, Ki-67 of 55%, HER-2/neu AMPLIFIED.   BREAST CANCER HISTORY:  [name] palpated a left breast mass and underwent a mammogram in 05/2012 that demonstrated the mass.  It was biopsied on 05/14/12 and the pathology revealed triple positive, grade II, uinvasive ductal carcinoma with a Ki-67 of 55%  PREVIOUS THERAPY: 1. [name] underwent a left breast lumpectomy by Dr. Donne Hazel on 06/10/2012.  A grade III, 1.2 cm invasive ductal carcinoma was removed with lymphovascular invasion, DCIS with calcifications, and comedo necrosis, , in situ carcinoma was present at the anterior margin, one lymph node was positive for metastatic mammary carcinoma.    2. [name] received adjuvant chemotherapy consisting of Docetaxel, Carboplatin, Trastuzumab x 6 cycles from 07/14/2012 through 10/27/2012.  She then completed one year of adjuvant every 3 week Trastuzumab from 11/03/2012 through 07/13/2013.    3.  [name] underwent radiation therapy from 11/29/2012 through 01/17/2013.  4.  Following radiation therapy, the [name] was started on adjuvant Letrozole, 2.20m PO daily.  A total of 5 years of therapy are planned.  CURRENT THERAPY: Letrozole 2.5 mg daily  INTERVAL HISTORY: [name] a 69year old female with triple positive invasive ductal carcinoma of the left breast.  She is here today for follow up.  She was previously under Dr. KLaurelyn Sicklecare, who has left the practice.   She is taking Letrozole daily and continues to tolerate it very well.  She denies any hot flush, she has chronic low back pain from arthritis, not on medication, no limitation on activities, no  other pain. She has good appetite and eats well. She still works 4 days a week. She is scheduled to have a mammogram in 1 months.   REVIEW OF SYSTEMS:  A 10 point review of systems was conducted and is otherwise negative except for what is noted above.     PAST MEDICAL HISTORY: Past Medical History  Diagnosis Date  . Hyperlipidemia   . Invasive ductal carcinoma of breast 2013    Left  . Arthritis   . Depression   . Heart murmur     heard one when she was pregnamt-maybe had echo-cannot remember  . Hypertension   . GERD (gastroesophageal reflux disease)   . Status post chemotherapy     chemotherapy consisting of THillsboroon 07/14/12 with 6 cycles planned. Every 3 week Herceptin starting 11/03/12.  . S/P radiation therapy  6-30 to 01-12-13                                                       6-30 to 01-12-13                                                          , Left Breast boost / 10 Gy in 5 fractions                    .  Iron deficiency anemia   . Hyperplastic colon polyp     PAST SURGICAL HISTORY: Past Surgical History  Procedure Laterality Date  . Tubal ligation  1988  . Bladder suspension  10/2010  . Tonsillectomy and adenoidectomy      age 60  . Abdominal adhesion surgery  1980  . Benign breast biopsy  1994    Left  . Breast lumpectomy with needle localization and axillary sentinel lymph node bx  06/10/2012    Procedure: BREAST LUMPECTOMY WITH NEEDLE LOCALIZATION AND AXILLARY SENTINEL LYMPH NODE BX;  Surgeon: Rolm Bookbinder, MD;  Location: [facility];  Service: General;  Laterality: Left;  . Appendectomy    . Cholecystectomy    . Portacath placement  07/07/2012    Procedure: INSERTION PORT-A-CATH;  Surgeon: Rolm Bookbinder, MD;  Location: [facility];  Service: General;  Laterality: Left;  Port a cath insertion    FAMILY HISTORY Family History  Problem Relation Age of Onset  . Colon cancer Mother 73  . Breast cancer Sister 47  . Prostate  cancer Father 23  . Stomach cancer Maternal Grandmother 67    GYNECOLOGIC HISTORY: (updated 04/19/2014) Menarche at age 64, G75 P2, [name] took oral HRT x 2 years in the 70s.  [name] has not had any abnormal pap smears, she has no h/o sexually transmitted infections.   SOCIAL HISTORY: (Updated 04/19/2014) Lives with boyfriend [name] in a town home in [place], [name].  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      ADVANCED DIRECTIVES:  (Updated 04/19/2014) In place.  Health care power of attorney is [name] her daughter, We do not have copies, and the [name] will bring these in for [place] to scan.  Dr. Glenna Durand office does have copies.     HEALTH MAINTENANCE: History  Substance Use Topics  . Smoking status: Never Smoker   . Smokeless tobacco: Never Used  . Alcohol Use: 0.6 oz/week    1 Glasses of wine per week     Comment: 1 glass of Wine per Week      Mammogram: 11/08/2013 Colonoscopy: 2013, 5 year f/u recommended Bone Density Scan: 10/2013, osteopenia Pap Smear:  Unsure when last one was Eye Exam: 2 years ago  Vitamin D Level:  Next appt Lipid Panel: 2013   No Known Allergies  Current Outpatient Prescriptions  Medication Sig Dispense Refill  . Ascorbic Acid (VITAMIN C) 1000 MG tablet Take 1,000 mg by mouth daily.      Marland Kitchen aspirin 81 MG tablet Take 81 mg by mouth daily.    Marland Kitchen atenolol (TENORMIN) 50 MG tablet Take 50 mg by mouth daily.    . Cholecalciferol (VITAMIN D) 2000 UNITS tablet Take 2,000 Units by mouth daily.    Marland Kitchen letrozole (FEMARA) 2.5 MG tablet Take 1 tablet (2.5 mg total) by mouth daily. 30 tablet 5  . olmesartan-hydrochlorothiazide (BENICAR HCT) 20-12.5 MG per tablet Take 1 tablet by mouth daily.    Marland Kitchen omeprazole (PRILOSEC) 40 MG capsule Take 40 mg by mouth daily.    . simvastatin (ZOCOR) 80 MG tablet Take 40 mg by mouth at bedtime.     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 1 Units 0  . vitamin E 400 UNIT capsule Take 400 Units by mouth daily.       No current facility-administered medications for this visit.    OBJECTIVE: There were  no vitals filed for this visit.   There is no weight on file to calculate BMI.      GENERAL: [name] is a well appearing female in no acute distress HEENT:  Sclerae anicteric.  Oropharynx clear and moist. No ulcerations or evidence of oropharyngeal candidiasis. Neck is supple.  NODES:  No cervical, supraclavicular, or axillary lymphadenopathy palpated.  BREAST EXAM:  Right breast, no masses, nodules, skin or nipple changes, left breast s/p lumpectomy, Radiation thickness noted, no nodularity, or sign of recurrence. Benign bilateral breast exam.   LUNGS:  Clear to auscultation bilaterally.  No wheezes or rhonchi. HEART:  Regular rate and rhythm. No murmur appreciated. ABDOMEN:  Soft, nontender.  Positive, normoactive bowel sounds. No organomegaly palpated. MSK:  No focal spinal tenderness to palpation. Full range of motion bilaterally in the upper extremities. EXTREMITIES:  No peripheral edema.   SKIN:  Clear with no obvious rashes or skin changes. No nail dyscrasia. NEURO:  Nonfocal. Well oriented.  Appropriate affect. ECOG FS:1 - Symptomatic but completely ambulatory  LAB RESULTS: CBC Latest Ref Rng 11/01/2014 04/19/2014 10/10/2013  WBC 3.9 - 10.3 10e3/uL 4.8 5.3 4.5  Hemoglobin 11.6 - 15.9 g/dL 12.0 11.7 11.6  Hematocrit 34.8 - 46.6 % 36.6 35.8 34.9  Platelets 145 - 400 10e3/uL 156 182 168    CMP Latest Ref Rng 11/01/2014 04/19/2014 10/10/2013  Glucose 70 - 140 mg/dl 92 95 88  BUN 7.0 - 26.0 mg/dL 17.5 27.7(H) 22.8  Creatinine 0.6 - 1.1 mg/dL 1.0 1.2(H) 1.0  Sodium 136 - 145 mEq/L 143 143 144  Potassium 3.5 - 5.1 mEq/L 5.0 4.6 4.5  Chloride 98 - 107 mEq/L - - -  CO2 22 - 29 mEq/L _0 Calcium 8.4 - 10.4 mg/dL 9.8 10.0 9.8  Total Protein 6.4 - 8.3 g/dL 6.7 7.0 6.7  Total Bilirubin 0.20 - 1.20 mg/dL 0.72  0.54 0.58  Alkaline Phos 40 - 150 U/L 66 69 66  AST 5 - 34 U/L _1 ALT 0 - 55 U/L _2 STUDIES: No results found.  ASSESSMENT: 69 y.o. [place], [name] with   1. pT1cN1M0, stage IIA invasive ductal carcinoma of the left breast, grade II, ER 100%, PR 100%, Ki-67 of 55%, HER-2/neu positive.  -She is clinically doing very well, no symptoms. -Her physical exam and her last mammogram show no evidence of recurrence -She'll continue annual mammogram, she is scheduled for the end of June -I encouraged her to continue healthy diet and regular exercise. She still works for the week, her mains physically active.  2. Osteopenia -Her last bone density scan was in June 2015 -She'll continue taking calcium and vitamin D -We discussed that letrozole may make her osteopenia worse. -Will repeat bone density scan in one year  Follow up: [name] will return in 6 months for labs and evaluation.   She knows to call us in the interim for any questions or concerns.  We can certainly see her sooner if needed.  I spent 25 minutes counseling the [name] face to face.  The total time spent in the appointment was 30 minutes.  Truitt Merle   11/01/2014 7:00 AM

## 2014-11-01 NOTE — Telephone Encounter (Signed)
per pof to sch pt appt-gave pt copy of sch °

## 2014-11-29 ENCOUNTER — Ambulatory Visit
Admission: RE | Admit: 2014-11-29 | Discharge: 2014-11-29 | Disposition: A | Discharge status: Home / Self Care | Payer: BLUE CROSS/BLUE SHIELD | Source: Ambulatory Visit | Attending: Internal Medicine | Admitting: Internal Medicine

## 2014-11-29 DIAGNOSIS — Z853 Personal history of malignant neoplasm of breast: Secondary | ICD-10-CM

## 2015-04-07 ENCOUNTER — Other Ambulatory Visit: Payer: Self-pay | Admitting: Orthopedic Surgery

## 2015-04-07 DIAGNOSIS — M542 Cervicalgia: Secondary | ICD-10-CM

## 2015-04-15 ENCOUNTER — Ambulatory Visit
Admission: RE | Admit: 2015-04-15 | Discharge: 2015-04-15 | Disposition: A | Discharge status: Home / Self Care | Payer: BLUE CROSS/BLUE SHIELD | Source: Ambulatory Visit | Attending: Orthopedic Surgery | Admitting: Orthopedic Surgery

## 2015-04-15 DIAGNOSIS — M542 Cervicalgia: Secondary | ICD-10-CM

## 2015-04-18 ENCOUNTER — Telehealth: Payer: Self-pay | Admitting: Hematology

## 2015-04-18 NOTE — Telephone Encounter (Signed)
cld & spoke to pt to adv of appt time change-adv appt @1  labs & 1:30 for MD

## 2015-05-09 ENCOUNTER — Telehealth: Payer: Self-pay | Admitting: Hematology

## 2015-05-09 ENCOUNTER — Other Ambulatory Visit: Payer: No Typology Code available for payment source

## 2015-05-09 ENCOUNTER — Encounter: Payer: Self-pay | Admitting: Hematology

## 2015-05-09 ENCOUNTER — Ambulatory Visit: Payer: No Typology Code available for payment source | Admitting: Hematology

## 2015-05-09 ENCOUNTER — Other Ambulatory Visit: Payer: Self-pay | Admitting: Physical Medicine and Rehabilitation

## 2015-05-09 DIAGNOSIS — E041 Nontoxic single thyroid nodule: Secondary | ICD-10-CM

## 2015-05-09 NOTE — Telephone Encounter (Signed)
Gave and printd appt sched pt needed to r/s

## 2015-05-09 NOTE — Progress Notes (Signed)
This encounter was created in error - please disregard.

## 2015-05-11 ENCOUNTER — Ambulatory Visit
Admission: RE | Admit: 2015-05-11 | Discharge: 2015-05-11 | Disposition: A | Discharge status: Home / Self Care | Payer: BLUE CROSS/BLUE SHIELD | Source: Ambulatory Visit | Attending: Physical Medicine and Rehabilitation | Admitting: Physical Medicine and Rehabilitation

## 2015-05-11 DIAGNOSIS — E041 Nontoxic single thyroid nodule: Secondary | ICD-10-CM

## 2015-05-11 DIAGNOSIS — E042 Nontoxic multinodular goiter: Secondary | ICD-10-CM | POA: Diagnosis not present

## 2015-05-16 ENCOUNTER — Other Ambulatory Visit: Payer: Self-pay | Admitting: Physical Medicine and Rehabilitation

## 2015-05-16 ENCOUNTER — Telehealth: Payer: Self-pay | Admitting: Hematology

## 2015-05-16 ENCOUNTER — Other Ambulatory Visit (HOSPITAL_BASED_OUTPATIENT_CLINIC_OR_DEPARTMENT_OTHER): Payer: BLUE CROSS/BLUE SHIELD

## 2015-05-16 ENCOUNTER — Ambulatory Visit (HOSPITAL_BASED_OUTPATIENT_CLINIC_OR_DEPARTMENT_OTHER): Payer: BLUE CROSS/BLUE SHIELD | Admitting: Hematology

## 2015-05-16 ENCOUNTER — Encounter: Payer: Self-pay | Admitting: Hematology

## 2015-05-16 VITALS — BP 147/58 | HR 62 | Temp 97.9°F | Resp 17 | Ht 62.0 in | Wt 171.7 lb

## 2015-05-16 DIAGNOSIS — C50412 Malignant neoplasm of upper-outer quadrant of left female breast: Secondary | ICD-10-CM

## 2015-05-16 DIAGNOSIS — C50912 Malignant neoplasm of unspecified site of left female breast: Secondary | ICD-10-CM

## 2015-05-16 DIAGNOSIS — Z79811 Long term (current) use of aromatase inhibitors: Secondary | ICD-10-CM

## 2015-05-16 DIAGNOSIS — M859 Disorder of bone density and structure, unspecified: Secondary | ICD-10-CM

## 2015-05-16 DIAGNOSIS — E041 Nontoxic single thyroid nodule: Secondary | ICD-10-CM

## 2015-05-16 DIAGNOSIS — Z170 Estrogen receptor positive status [ER+]: Secondary | ICD-10-CM

## 2015-05-16 LAB — COMPREHENSIVE METABOLIC PANEL
ALBUMIN: 3.7 g/dL (ref 3.5–5.0)
ALK PHOS: 74 U/L (ref 40–150)
ALT: 21 U/L (ref 0–55)
AST: 21 U/L (ref 5–34)
Anion Gap: 9 mEq/L (ref 3–11)
BILIRUBIN TOTAL: 0.47 mg/dL (ref 0.20–1.20)
BUN: 25 mg/dL (ref 7.0–26.0)
CO2: 28 mEq/L (ref 22–29)
Calcium: 10.1 mg/dL (ref 8.4–10.4)
Chloride: 105 mEq/L (ref 98–109)
Creatinine: 1.1 mg/dL (ref 0.6–1.1)
EGFR: 51 mL/min/{1.73_m2} — ABNORMAL LOW (ref >90)
GLUCOSE: 82 mg/dL (ref 70–140)
Potassium: 4.5 mEq/L (ref 3.5–5.1)
Sodium: 141 mEq/L (ref 136–145)
TOTAL PROTEIN: 7.3 g/dL (ref 6.4–8.3)

## 2015-05-16 LAB — CBC WITH DIFFERENTIAL/PLATELET
BASO%: 1 % (ref 0.0–2.0)
Basophils Absolute: 0.1 10*3/uL (ref 0.0–0.1)
EOS ABS: 0.1 10*3/uL (ref 0.0–0.5)
EOS%: 2 % (ref 0.0–7.0)
HCT: 37.8 % (ref 34.8–46.6)
HGB: 12.4 g/dL (ref 11.6–15.9)
LYMPH%: 36.4 % (ref 14.0–49.7)
MCH: 29.2 pg (ref 25.1–34.0)
MCHC: 32.8 g/dL (ref 31.5–36.0)
MCV: 89.2 fL (ref 79.5–101.0)
MONO#: 0.5 10*3/uL (ref 0.1–0.9)
MONO%: 8.6 % (ref 0.0–14.0)
NEUT#: 3 10*3/uL (ref 1.5–6.5)
NEUT%: 52 % (ref 38.4–76.8)
PLATELETS: 231 10*3/uL (ref 145–400)
RBC: 4.24 10*6/uL (ref 3.70–5.45)
RDW: 13.7 % (ref 11.2–14.5)
WBC: 5.8 10*3/uL (ref 3.9–10.3)
lymph#: 2.1 10*3/uL (ref 0.9–3.3)

## 2015-05-16 MED ORDER — EXEMESTANE 25 MG PO TABS: 25.0000 mg | ORAL_TABLET | Freq: Every day | ORAL | Status: DC

## 2015-05-16 NOTE — Progress Notes (Signed)
[facility] follow-up note  ID: CATHLYN TERSIGNI OB: 10-03-1945  MR#: [id number]  RFF#:[id number]  PCP: Wenda Low, MD GYN:   SU: Dr. Rolm Bookbinder OTHER MD: Dr. Isidore Moos, radiation oncology  CHIEF COMPLAINT:  69 y/o [place], [name] with T1 N1, stage IIA invasive ductal carcinoma of the left breast, grade II, ER 100%, PR 100%, Ki-67 of 55%, HER-2/neu AMPLIFIED.   BREAST CANCER HISTORY:  Patient palpated a left breast mass and underwent a mammogram in 05/2012 that demonstrated the mass.  It was biopsied on 05/14/12 and the pathology revealed triple positive, grade II, uinvasive ductal carcinoma with a Ki-67 of 55%  PREVIOUS THERAPY: 1. Patient underwent a left breast lumpectomy by Dr. Donne Hazel on 06/10/2012.  A grade III, 1.2 cm invasive ductal carcinoma was removed with lymphovascular invasion, DCIS with calcifications, and comedo necrosis, , in situ carcinoma was present at the anterior margin, one lymph node was positive for metastatic mammary carcinoma.    2. Patient received adjuvant chemotherapy consisting of Docetaxel, Carboplatin, Trastuzumab x 6 cycles from 07/14/2012 through 10/27/2012.  She then completed one year of adjuvant every 3 week Trastuzumab from 11/03/2012 through 07/13/2013.    3.  Patient underwent radiation therapy from 11/29/2012 through 01/17/2013.  4.  Following radiation therapy, the patient was started on adjuvant Letrozole, 2.39m PO daily.  A total of 5 years of therapy are planned.  CURRENT THERAPY: Letrozole 2.5 mg daily  INTERVAL HISTORY: Ms. WBariis a 69year old female with triple positive invasive ductal carcinoma of the left breast.  She is here today for follow up.  She was last seen by me 6 months ago.  She has been having neck pain for the past 6 months, is being much worse lately, especially with head movements, it radiates to her scalp also. She has seen orthopedic surgeon, neurosurgeon, and injection was recommended which is  scheduled for early January. She also has moderate pain in other joints, especially in her finger joints, low back. She was also found to have a thyroid nodule lately, and is going to have a needle biopsy. She is quite stressed by that. She otherwise is doing well, has good appetite and energy level, denies any other pain, dyspnea, or GI symptoms. She is compliant with letrozole, hot flash is mild, but she thinks her worsening arthritis may be related to letrozole.   REVIEW OF SYSTEMS:  A 10 point review of systems was conducted and is otherwise negative except for what is noted above.     PAST MEDICAL HISTORY: Past Medical History  Diagnosis Date  . Hyperlipidemia   . In[place] [place]    Left  . Arthritis   . Depression   . Heart murmur     heard one when she was pregnamt-maybe had echo-cannot remember  . Hypertension   . GERD (gastroesophageal reflux disease)   . Status post chemotherapy     chemotherapy consisting of TVermilionon 07/14/12 with 6 cycles planned. Every 3 week Herceptin starting 11/03/12.  . S/P radiation therapy  6-30 to 01-12-13                                                       6-30 to 01-12-13                                                          ,  Left Breast boost / 10 Gy in 5 fractions                    . Iron deficiency anemia   . Hyperplastic colon polyp     PAST SURGICAL HISTORY: Past Surgical History  Procedure Laterality Date  . Tubal ligation  1988  . Bladder suspension  10/2010  . Tonsillectomy and adenoidectomy      age 56  . Abdominal adhesion surgery  1980  . Benign breast biopsy  1994    Left  . Breast lumpectomy with needle localization and axillary sentinel lymph node bx  06/10/2012    Procedure: BREAST LUMPECTOMY WITH NEEDLE LOCALIZATION AND AXILLARY SENTINEL LYMPH NODE BX;  Surgeon: Rolm Bookbinder, MD;  Location: [facility];  Service: General;  Laterality: Left;  . Appendectomy    . Cholecystectomy     . Portacath placement  07/07/2012    Procedure: INSERTION PORT-A-CATH;  Surgeon: Rolm Bookbinder, MD;  Location: [facility];  Service: General;  Laterality: Left;  Port a cath insertion    FAMILY HISTORY Family History  Problem Relation Age of Onset  . Colon cancer Mother 74  . Breast cancer Sister 49  . Prostate cancer Father 74  . Stomach cancer Maternal Grandmother 67    GYNECOLOGIC HISTORY: (updated 04/19/2014) Menarche at age 89, G46 P2, patient took oral HRT x 2 years in the 32s.  Patient has not had any abnormal pap smears, she has no h/o sexually transmitted infections.   SOCIAL HISTORY: (Updated 04/19/2014) Lives with boyfriend [name] in a town home in [place], [name].  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      ADVANCED DIRECTIVES:  (Updated 04/19/2014) In place.  Health care power of attorney is [name] her daughter, We do not have copies, and the patient will bring these in for [place] to scan.  Dr. Glenna Durand office does have copies.     HEALTH MAINTENANCE: Social History  Substance Use Topics  . Smoking status: Never Smoker   . Smokeless tobacco: Never Used  . Alcohol Use: 0.6 oz/week    1 Glasses of wine per week     Comment: 1 glass of Wine per Week      Mammogram: 11/29/2014 Colonoscopy: 2013, 5 year f/u recommended Bone Density Scan: 10/2013, osteopenia Pap Smear:  Unsure when last one was Eye Exam: 2 years ago  Vitamin D Level:  Next appt Lipid Panel: 2013   No Known Allergies  Current Outpatient Prescriptions  Medication Sig Dispense Refill  . Ascorbic Acid (VITAMIN C) 1000 MG tablet Take 1,000 mg by mouth daily.      Marland Kitchen aspirin 81 MG tablet Take 81 mg by mouth daily.    Marland Kitchen atenolol (TENORMIN) 50 MG tablet Take 50 mg by mouth daily.    . Cholecalciferol (VITAMIN D) 2000 UNITS tablet Take 2,000 Units by mouth daily.    Marland Kitchen letrozole (FEMARA) 2.5 MG tablet Take 1 tablet (2.5  mg total) by mouth daily. 30 tablet 5  . omeprazole (PRILOSEC) 40 MG capsule Take 40 mg by mouth daily.    . simvastatin (ZOCOR) 80 MG tablet Take 40 mg by mouth at bedtime.     . vitamin E 400 UNIT capsule Take 400 Units by mouth daily.      Marland Kitchen exemestane (AROMASIN) 25 MG tablet Take 1 tablet (25 mg total) by mouth daily after  breakfast. 30 tablet 2   No current facility-administered medications for this visit.    OBJECTIVE: Filed Vitals:   05/16/15 0924  BP: 147/58  Pulse: 62  Temp: 97.9 F (36.6 C)  Resp: 17     Body mass index is 31.4 kg/(m^2).      GENERAL: Patient is a well appearing female in no acute distress HEENT:  Sclerae anicteric.  Oropharynx clear and moist. No ulcerations or evidence of oropharyngeal candidiasis. Neck is supple.  NODES:  No cervical, supraclavicular, or axillary lymphadenopathy palpated.  BREAST EXAM:  Right breast, no masses, nodules, skin or nipple changes, left breast s/p lumpectomy, Radiation thickness noted, no nodularity, or sign of recurrence. Benign bilateral breast exam.   LUNGS:  Clear to auscultation bilaterally.  No wheezes or rhonchi. HEART:  Regular rate and rhythm. No murmur appreciated. ABDOMEN:  Soft, nontender.  Positive, normoactive bowel sounds. No organomegaly palpated. MSK:  No focal spinal tenderness to palpation. Full range of motion bilaterally in the upper extremities. EXTREMITIES:  No peripheral edema.   SKIN:  Clear with no obvious rashes or skin changes. No nail dyscrasia. NEURO:  Nonfocal. Well oriented.  Appropriate affect. ECOG FS:1 - Symptomatic but completely ambulatory  LAB RESULTS: CBC Latest Ref Rng 05/16/2015 11/01/2014 04/19/2014  WBC 3.9 - 10.3 10e3/uL 5.8 4.8 5.3  Hemoglobin 11.6 - 15.9 g/dL 12.4 12.0 11.7  Hematocrit 34.8 - 46.6 % 37.8 36.6 35.8  Platelets 145 - 400 10e3/uL 231 156 182    CMP Latest Ref Rng 11/01/2014 04/19/2014 10/10/2013  Glucose 70 - 140 mg/dl 92 95 88  BUN 7.0 - 26.0 mg/dL 17.5 27.7(H)  22.8  Creatinine 0.6 - 1.1 mg/dL 1.0 1.2(H) 1.0  Sodium 136 - 145 mEq/L 143 143 144  Potassium 3.5 - 5.1 mEq/L 5.0 4.6 4.5  Chloride 98 - 107 mEq/L - - -  CO2 22 - 29 mEq/L '27 28 28  ' Calcium 8.4 - 10.4 mg/dL 9.8 10.0 9.8  Total Protein 6.4 - 8.3 g/dL 6.7 7.0 6.7  Total Bilirubin 0.20 - 1.20 mg/dL 0.72 0.54 0.58  Alkaline Phos 40 - 150 U/L 66 69 66  AST 5 - 34 U/L '24 25 22  ' ALT 0 - 55 U/L '22 24 20     ' STUDIES: Ultrasound soft tissue neck 05/11/2015 IMPRESSION: Dominant 2.1 cm left lower pole solid nodule with microcalcifications meets criteria for biopsy.  Additional bilateral thyroid nodules as above.  ASSESSMENT: 69 y.o. [place], [name] with   1. pT1cN1M0, stage IIA invasive ductal carcinoma of the left breast, grade II, ER 100%, PR 100%, Ki-67 of 55%, HER-2/neu positive.  -She is clinically doing very well, her last mammogram and today's physical exam children no evidence of local recurrence. -Lab reviewed, CBC normal, CMP still pending. -She has significant worsening arthritis, especially in her cervical spine, possible related to letrozole. I recommend her to switch to exemestane, to see if she can tolerate better. She agrees. A prescription was sent to her pharmacy today  -She'll continue annual mammogram, she is scheduled for the end of June -I encouraged her to continue healthy diet and regular exercise.   2. Osteopenia -Her last bone density scan was in June 2015 -She'll continue taking calcium and vitamin D -We discussed that letrozole may make her osteopenia worse. -Will repeat bone density scan in June 2017   3. Severe arthritis -She is seen orthopedic surgeon and neurosurgeon, is scheduled for cervical injection in early January   4. Thyroid nodule -  She is scheduled for biopsy -She'll follow-up with her primary care physician  Plan -Change which resulted to exemestane  -return in 6 months for labs and evaluation -mammogram and bone density scan in  June 2017.     She knows to call us in the interim for any questions or concerns.  We can certainly see her sooner if needed.  I spent 25 minutes counseling the patient face to face.  The total time spent in the appointment was 30 minutes.  Truitt Merle   05/16/2015 9:55 AM

## 2015-05-16 NOTE — Telephone Encounter (Signed)
Gave and printd appt sched and avs for pt for June 2017

## 2015-06-02 ENCOUNTER — Other Ambulatory Visit: Payer: Self-pay | Admitting: Hematology

## 2015-06-06 ENCOUNTER — Telehealth: Payer: Self-pay | Admitting: *Deleted

## 2015-06-06 ENCOUNTER — Other Ambulatory Visit: Payer: Medicare Other

## 2015-06-06 MED FILL — EXEMESTANE 25 MG TABLET: 25 | 30 days supply | Qty: 30 | Fill #0

## 2015-06-06 NOTE — Telephone Encounter (Signed)
[name] called reporting "I need a refill on Letrozole sent to [facility] on [address].  The new medicine Dr. Burr Medico ordered cost $512.00 and I cannot afford this.  I'd rather stay on the letrozole."  Will notify Dr. Burr Medico.  Advised this may be something our pharmacist or managed care can help with.  This nurse observed Aromasin ordered with last office visit.

## 2015-06-06 NOTE — Telephone Encounter (Signed)
Thu,  Please let pt know that Aromasin costs a lot less if she fills it in [facility] or [facility]. We can call in if she wants.   Truitt Merle  06/06/2015

## 2015-06-06 NOTE — Telephone Encounter (Signed)
Received call from [facility] re: pt's copay for Exemestane will be $ 10.00 .  Called pt at home and left message on voice mail requesting a call back to collaborative nurse in the am.

## 2015-06-08 NOTE — Telephone Encounter (Signed)
      Call Documentation      Thu Simon Rhein, RN at 06/06/2015 5:55 PM     Status: Signed       Expand All Collapse All   Received call from [facility] re: pt's copay for Exemestane will be $ 10.00 . Called pt at home and left message on voice mail requesting a call back to collaborative nurse in the am.

## 2015-06-14 ENCOUNTER — Other Ambulatory Visit (HOSPITAL_COMMUNITY)
Admission: RE | Admit: 2015-06-14 | Discharge: 2015-06-14 | Disposition: A | Discharge status: Home / Self Care | Payer: BLUE CROSS/BLUE SHIELD | Source: Ambulatory Visit | Attending: Physical Medicine and Rehabilitation | Admitting: Physical Medicine and Rehabilitation

## 2015-06-14 ENCOUNTER — Ambulatory Visit
Admission: RE | Admit: 2015-06-14 | Discharge: 2015-06-14 | Disposition: A | Discharge status: Home / Self Care | Payer: BLUE CROSS/BLUE SHIELD | Source: Ambulatory Visit | Attending: Physical Medicine and Rehabilitation | Admitting: Physical Medicine and Rehabilitation

## 2015-06-14 DIAGNOSIS — E041 Nontoxic single thyroid nodule: Secondary | ICD-10-CM | POA: Diagnosis not present

## 2015-07-02 MED FILL — EXEMESTANE 25 MG TABLET: 25 | 30 days supply | Qty: 30 | Fill #1

## 2015-07-30 MED FILL — EXEMESTANE 25 MG TABLET: 25 | 30 days supply | Qty: 30 | Fill #2

## 2015-09-03 ENCOUNTER — Other Ambulatory Visit: Payer: Self-pay | Admitting: Hematology

## 2015-09-03 MED FILL — EXEMESTANE 25 MG TABLET: 25 | 30 days supply | Qty: 30 | Fill #0

## 2015-09-30 MED FILL — EXEMESTANE 25 MG TABLET: 25 | 30 days supply | Qty: 30 | Fill #1

## 2015-10-24 MED FILL — EXEMESTANE 25 MG TABLET: 25 | 30 days supply | Qty: 30 | Fill #2

## 2015-11-07 ENCOUNTER — Other Ambulatory Visit (HOSPITAL_BASED_OUTPATIENT_CLINIC_OR_DEPARTMENT_OTHER): Payer: 59

## 2015-11-07 ENCOUNTER — Ambulatory Visit (HOSPITAL_BASED_OUTPATIENT_CLINIC_OR_DEPARTMENT_OTHER): Payer: 59 | Admitting: Hematology

## 2015-11-07 ENCOUNTER — Encounter: Payer: Self-pay | Admitting: Hematology

## 2015-11-07 ENCOUNTER — Telehealth: Payer: Self-pay | Admitting: Hematology

## 2015-11-07 VITALS — BP 135/54 | HR 62 | Temp 97.8°F | Resp 20 | Ht 62.0 in | Wt 172.2 lb

## 2015-11-07 DIAGNOSIS — C50912 Malignant neoplasm of unspecified site of left female breast: Secondary | ICD-10-CM

## 2015-11-07 DIAGNOSIS — M8580 Other specified disorders of bone density and structure, unspecified site: Secondary | ICD-10-CM

## 2015-11-07 DIAGNOSIS — C50412 Malignant neoplasm of upper-outer quadrant of left female breast: Secondary | ICD-10-CM

## 2015-11-07 DIAGNOSIS — M1990 Unspecified osteoarthritis, unspecified site: Secondary | ICD-10-CM

## 2015-11-07 LAB — CBC WITH DIFFERENTIAL/PLATELET
BASO%: 0.5 % (ref 0.0–2.0)
BASOS ABS: 0 10*3/uL (ref 0.0–0.1)
EOS ABS: 0.1 10*3/uL (ref 0.0–0.5)
EOS%: 1.6 % (ref 0.0–7.0)
HCT: 40.4 % (ref 34.8–46.6)
HGB: 13.3 g/dL (ref 11.6–15.9)
LYMPH%: 31.2 % (ref 14.0–49.7)
MCH: 31.1 pg (ref 25.1–34.0)
MCHC: 32.9 g/dL (ref 31.5–36.0)
MCV: 94.4 fL (ref 79.5–101.0)
MONO#: 0.4 10*3/uL (ref 0.1–0.9)
MONO%: 7.5 % (ref 0.0–14.0)
NEUT%: 59.2 % (ref 38.4–76.8)
NEUTROS ABS: 3.4 10*3/uL (ref 1.5–6.5)
PLATELETS: 171 10*3/uL (ref 145–400)
RBC: 4.28 10*6/uL (ref 3.70–5.45)
RDW: 12.9 % (ref 11.2–14.5)
WBC: 5.7 10*3/uL (ref 3.9–10.3)
lymph#: 1.8 10*3/uL (ref 0.9–3.3)

## 2015-11-07 LAB — COMPREHENSIVE METABOLIC PANEL
ALT: 20 U/L (ref 0–55)
ANION GAP: 10 meq/L (ref 3–11)
AST: 21 U/L (ref 5–34)
Albumin: 4 g/dL (ref 3.5–5.0)
Alkaline Phosphatase: 62 U/L (ref 40–150)
BILIRUBIN TOTAL: 0.87 mg/dL (ref 0.20–1.20)
BUN: 18.7 mg/dL (ref 7.0–26.0)
CHLORIDE: 107 meq/L (ref 98–109)
CO2: 29 meq/L (ref 22–29)
Calcium: 10.3 mg/dL (ref 8.4–10.4)
Creatinine: 1.1 mg/dL (ref 0.6–1.1)
EGFR: 52 mL/min/{1.73_m2} — AB (ref >90)
GLUCOSE: 99 mg/dL (ref 70–140)
POTASSIUM: 4.5 meq/L (ref 3.5–5.1)
SODIUM: 145 meq/L (ref 136–145)
TOTAL PROTEIN: 7.1 g/dL (ref 6.4–8.3)

## 2015-11-07 MED ORDER — EXEMESTANE 25 MG PO TABS: 25.0000 mg | ORAL_TABLET | Freq: Every day | ORAL | Status: DC

## 2015-11-07 MED FILL — EXEMESTANE 25 MG TABLET: 25 | 90 days supply | Qty: 90 | Fill #0

## 2015-11-07 NOTE — Telephone Encounter (Signed)
Gave and printed appt sched and avs for pt for July and DEc

## 2015-11-07 NOTE — Progress Notes (Signed)
[place] Office follow-up note  ID: [name] OB: 10-17-1945  MR#: [id number]  PXT#:[id number]  PCP: Wenda Low, MD GYN:   SU: Dr. Rolm Bookbinder OTHER MD: Dr. Isidore Moos, radiation oncology  CHIEF COMPLAINT:  70 y/o [place], [name] with T1 N1, stage IIA invasive ductal carcinoma of the left breast, grade II, ER 100%, PR 100%, Ki-67 of 55%, HER-2/neu AMPLIFIED.   BREAST CANCER HISTORY:  Patient palpated a left breast mass and underwent a mammogram in 05/2012 that demonstrated the mass.  It was biopsied on 05/14/12 and the pathology revealed triple positive, grade II, uinvasive ductal carcinoma with a Ki-67 of 55%  PREVIOUS THERAPY: 1. Patient underwent a left breast lumpectomy by Dr. Donne Hazel on 06/10/2012.  A grade III, 1.2 cm invasive ductal carcinoma was removed with lymphovascular invasion, DCIS with calcifications, and comedo necrosis, , in situ carcinoma was present at the anterior margin, one lymph node was positive for metastatic mammary carcinoma.    2. Patient received adjuvant chemotherapy consisting of Docetaxel, Carboplatin, Trastuzumab x 6 cycles from 07/14/2012 through 10/27/2012.  She then completed one year of adjuvant every 3 week Trastuzumab from 11/03/2012 through 07/13/2013.    3.  Patient underwent radiation therapy from 11/29/2012 through 01/17/2013.  4.  Following radiation therapy, the patient was started on adjuvant Letrozole, 2.5m PO daily.  A total of 5 years of therapy are planned.  CURRENT THERAPY: Letrozole 2.5 mg daily, changed to exemestane 284mdaily from 05/17/2015  INTERVAL HISTORY: [name] a [age]ear old female with triple positive invasive ductal carcinoma of the left breast.  She is here today for follow up.  She was last seen by me 6 months ago.  She is doing well overall, she changed from letrozole to exemestane 6 months ago, tolerating very well, no significant hot flush or other side effects. Her arthritis in neck also  slightly improved since she changed medication. She has had several injections to the neck, she still has some difficulty and pain when she moves her head. She been following with her orthopedic surgeon. No other significant pain or discomfort. She otherwise doing well, she walks her dog every day, weight is stable.    REVIEW OF SYSTEMS:  A 10 point review of systems was conducted and is otherwise negative except for what is noted above.     PAST MEDICAL HISTORY: Past Medical History  Diagnosis Date  . Hyperlipidemia   . [place]    Left  . Arthritis   . Depression   . Heart murmur     heard one when she was pregnamt-maybe had echo-cannot remember  . Hypertension   . GERD (gastroesophageal reflux disease)   . Status post chemotherapy     chemotherapy consisting of TCButlervillen 07/14/12 with 6 cycles planned. Every 3 week Herceptin starting 11/03/12.  . S/P radiation therapy  6-30 to 01-12-13                                                       6-30 to 01-12-13                                                          ,  Left Breast boost / 10 Gy in 5 fractions                    . Iron deficiency anemia   . Hyperplastic colon polyp     PAST SURGICAL HISTORY: Past Surgical History  Procedure Laterality Date  . Tubal ligation  1988  . Bladder suspension  10/2010  . Tonsillectomy and adenoidectomy      age 74  . Abdominal adhesion surgery  1980  . Benign breast biopsy  1994    Left  . Breast lumpectomy with needle localization and axillary sentinel lymph node bx  06/10/2012    Procedure: BREAST LUMPECTOMY WITH NEEDLE LOCALIZATION AND AXILLARY SENTINEL LYMPH NODE BX;  Surgeon: Rolm Bookbinder, MD;  Location: [facility];  Service: General;  Laterality: Left;  . Appendectomy    . Cholecystectomy    . Portacath placement  07/07/2012    Procedure: INSERTION PORT-A-CATH;  Surgeon: Rolm Bookbinder, MD;  Location: [facility];   Service: General;  Laterality: Left;  Port a cath insertion    FAMILY HISTORY Family History  Problem Relation Age of Onset  . Colon cancer Mother 65  . Breast cancer Sister 49  . Prostate cancer Father 65  . Stomach cancer Maternal Grandmother 67    GYNECOLOGIC HISTORY: (updated 04/19/2014) Menarche at age 61, G49 P2, patient took oral HRT x 2 years in the 61s.  Patient has not had any abnormal pap smears, she has no h/o sexually transmitted infections.   SOCIAL HISTORY: (Updated 04/19/2014) Lives with boyfriend [name] in a town home in [place], [name].  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      ADVANCED DIRECTIVES:  (Updated 04/19/2014) In place.  Health care power of attorney is [name] her daughter, We do not have copies, and the patient will bring these in for [place] to scan.  Dr. Glenna Durand office does have copies.     HEALTH MAINTENANCE: Social History  Substance Use Topics  . Smoking status: Never Smoker   . Smokeless tobacco: Never Used  . Alcohol Use: 0.6 oz/week    1 Glasses of wine per week     Comment: 1 glass of Wine per Week      Mammogram: 11/29/2014 Colonoscopy: 2013, 5 year f/u recommended Bone Density Scan: 10/2013, osteopenia Pap Smear:  Unsure when last one was Eye Exam: 2 years ago  Vitamin D Level:  Next appt Lipid Panel: 2013   No Known Allergies  Current Outpatient Prescriptions  Medication Sig Dispense Refill  . Ascorbic Acid (VITAMIN C) 1000 MG tablet Take 1,000 mg by mouth daily.      Marland Kitchen aspirin 81 MG tablet Take 81 mg by mouth daily.    Marland Kitchen atenolol (TENORMIN) 50 MG tablet Take 50 mg by mouth daily.    . Cholecalciferol (VITAMIN D) 2000 UNITS tablet Take 2,000 Units by mouth daily.    Marland Kitchen exemestane (AROMASIN) 25 MG tablet TAKE 1 TABLET BY MOUTH ONCE DAILY 30 tablet 2  . letrozole (FEMARA) 2.5 MG tablet Take 1 tablet (2.5 mg total) by mouth daily. 30 tablet 5  . omeprazole  (PRILOSEC) 40 MG capsule Take 40 mg by mouth daily.    . simvastatin (ZOCOR) 80 MG tablet Take 40 mg by mouth at bedtime.     . vitamin E 400 UNIT capsule Take 400 Units by mouth daily.  No current facility-administered medications for this visit.    OBJECTIVE: There were no vitals filed for this visit.   There is no weight on file to calculate BMI.      GENERAL: Patient is a well appearing female in no acute distress HEENT:  Sclerae anicteric.  Oropharynx clear and moist. No ulcerations or evidence of oropharyngeal candidiasis. Neck is supple.  NODES:  No cervical, supraclavicular, or axillary lymphadenopathy palpated.  BREAST EXAM:  Right breast, no masses, nodules, skin or nipple changes, left breast s/p lumpectomy, Radiation thickness noted, no nodularity, or sign of recurrence. Benign bilateral breast exam.   LUNGS:  Clear to auscultation bilaterally.  No wheezes or rhonchi. HEART:  Regular rate and rhythm. No murmur appreciated. ABDOMEN:  Soft, nontender.  Positive, normoactive bowel sounds. No organomegaly palpated. MSK:  No focal spinal tenderness to palpation. Full range of motion bilaterally in the upper extremities. EXTREMITIES:  No peripheral edema.   SKIN:  Clear with no obvious rashes or skin changes. No nail dyscrasia. NEURO:  Nonfocal. Well oriented.  Appropriate affect. ECOG FS:1 - Symptomatic but completely ambulatory  LAB RESULTS: CBC Latest Ref Rng 05/16/2015 11/01/2014 04/19/2014  WBC 3.9 - 10.3 10e3/uL 5.8 4.8 5.3  Hemoglobin 11.6 - 15.9 g/dL 12.4 12.0 11.7  Hematocrit 34.8 - 46.6 % 37.8 36.6 35.8  Platelets 145 - 400 10e3/uL 231 156 182    CMP Latest Ref Rng 05/16/2015 11/01/2014 04/19/2014  Glucose 70 - 140 mg/dl 82 92 95  BUN 7.0 - 26.0 mg/dL 25.0 17.5 27.7(H)  Creatinine 0.6 - 1.1 mg/dL 1.1 1.0 1.2(H)  Sodium 136 - 145 mEq/L 141 143 143  Potassium 3.5 - 5.1 mEq/L 4.5 5.0 4.6  CO2 22 - 29 mEq/L _0 Calcium 8.4 - 10.4 mg/dL 10.1 9.8 10.0  Total  Protein 6.4 - 8.3 g/dL 7.3 6.7 7.0  Total Bilirubin 0.20 - 1.20 mg/dL 0.47 0.72 0.54  Alkaline Phos 40 - 150 U/L 74 66 69  AST 5 - 34 U/L _1 ALT 0 - 55 U/L _2 STUDIES: Ultrasound soft tissue neck 05/11/2015 IMPRESSION: Dominant 2.1 cm left lower pole solid nodule with microcalcifications meets criteria for biopsy.  Additional bilateral thyroid nodules as above.  ASSESSMENT: 70 y.o. [place], [name] with   1. pT1cN1M0, stage IIA invasive ductal carcinoma of the left breast, grade II, ER 100%, PR 100%, Ki-67 of 55%, HER-2/neu positive.  -She is clinically doing very well, her last mammogram and today's physical exam children no evidence of local recurrence. -Lab reviewed, CBC normal, CMP still pending. -She has been tolerating exemestane very well, we'll continue. Given her stage II disease high risk, if she tolerates well, we may consider extend her adjuvant endocrine therapy to 10 years. -She'll continue annual mammogram, she is due for this month  -I encouraged her to continue healthy diet and regular exercise.   2. Osteopenia -Her last bone density scan was in June 2015 -She'll continue taking calcium and vitamin D -We discussed that letrozole and exemestane may make her osteopenia worse. -Will repeat bone density scan in June 2017   3. Severe arthritis -She is seen orthopedic surgeon and neurosurgeon.   4. Thyroid nodule -Her biopsy in January 2017 was negative for malignant cells. -She'll follow-up with her primary care physician  Plan -Continue exemestane, I refilled for her today -return in 6 months for labs and evaluation -mammogram and bone density scan in June 2017.  She knows to call us in the interim for any questions or concerns.  We can certainly see her sooner if needed.  I spent 25 minutes counseling the patient face to face.  The total time spent in the appointment was 30 minutes.  Truitt Merle   11/07/2015 6:46 AM

## 2015-11-16 ENCOUNTER — Other Ambulatory Visit: Payer: Self-pay | Admitting: Surgery

## 2015-11-16 DIAGNOSIS — E042 Nontoxic multinodular goiter: Secondary | ICD-10-CM

## 2015-11-22 ENCOUNTER — Ambulatory Visit
Admission: RE | Admit: 2015-11-22 | Discharge: 2015-11-22 | Disposition: A | Discharge status: Home / Self Care | Payer: 59 | Source: Ambulatory Visit | Attending: Surgery | Admitting: Surgery

## 2015-11-22 DIAGNOSIS — E042 Nontoxic multinodular goiter: Secondary | ICD-10-CM

## 2015-11-29 ENCOUNTER — Other Ambulatory Visit: Payer: Self-pay | Admitting: Surgery

## 2015-11-29 DIAGNOSIS — E041 Nontoxic single thyroid nodule: Secondary | ICD-10-CM

## 2015-12-01 ENCOUNTER — Other Ambulatory Visit (HOSPITAL_COMMUNITY)
Admission: RE | Admit: 2015-12-01 | Discharge: 2015-12-01 | Disposition: A | Discharge status: Home / Self Care | Payer: 59 | Source: Ambulatory Visit | Attending: General Surgery | Admitting: General Surgery

## 2015-12-01 DIAGNOSIS — E041 Nontoxic single thyroid nodule: Secondary | ICD-10-CM | POA: Insufficient documentation

## 2015-12-05 ENCOUNTER — Ambulatory Visit
Admission: RE | Admit: 2015-12-05 | Discharge: 2015-12-05 | Disposition: A | Discharge status: Home / Self Care | Payer: 59 | Source: Ambulatory Visit | Attending: Hematology | Admitting: Hematology

## 2015-12-05 DIAGNOSIS — C50412 Malignant neoplasm of upper-outer quadrant of left female breast: Secondary | ICD-10-CM

## 2015-12-12 ENCOUNTER — Telehealth: Payer: Self-pay | Admitting: Internal Medicine

## 2015-12-12 ENCOUNTER — Ambulatory Visit
Admission: RE | Admit: 2015-12-12 | Discharge: 2015-12-12 | Disposition: A | Discharge status: Home / Self Care | Payer: 59 | Source: Ambulatory Visit | Attending: Surgery | Admitting: Surgery

## 2015-12-12 DIAGNOSIS — E041 Nontoxic single thyroid nodule: Secondary | ICD-10-CM

## 2015-12-12 NOTE — Telephone Encounter (Signed)
Patient with abdominal pain.  She is on Cipro and flagyl with no improvement.  She will come in and see Nicoletta Ba PA next wed 12/18/15  at 10:30

## 2015-12-19 ENCOUNTER — Other Ambulatory Visit (INDEPENDENT_AMBULATORY_CARE_PROVIDER_SITE_OTHER): Payer: 59

## 2015-12-19 ENCOUNTER — Encounter: Payer: Self-pay | Admitting: Physician Assistant

## 2015-12-19 ENCOUNTER — Ambulatory Visit (INDEPENDENT_AMBULATORY_CARE_PROVIDER_SITE_OTHER): Payer: 59 | Admitting: Physician Assistant

## 2015-12-19 VITALS — BP 132/76 | HR 66 | Ht 62.0 in | Wt 170.0 lb

## 2015-12-19 DIAGNOSIS — R1032 Left lower quadrant pain: Secondary | ICD-10-CM

## 2015-12-19 LAB — BASIC METABOLIC PANEL
BUN: 24 mg/dL — AB (ref 6–23)
CALCIUM: 10.5 mg/dL (ref 8.4–10.5)
CO2: 30 mEq/L (ref 19–32)
Chloride: 104 mEq/L (ref 96–112)
Creatinine, Ser: 0.95 mg/dL (ref 0.40–1.20)
GFR: 61.43 mL/min (ref >60.00)
GLUCOSE: 81 mg/dL (ref 70–99)
POTASSIUM: 4.6 meq/L (ref 3.5–5.1)
SODIUM: 141 meq/L (ref 135–145)

## 2015-12-19 LAB — CBC WITH DIFFERENTIAL/PLATELET
BASOS PCT: 0.4 % (ref 0.0–3.0)
Basophils Absolute: 0 10*3/uL (ref 0.0–0.1)
EOS ABS: 0.1 10*3/uL (ref 0.0–0.7)
EOS PCT: 1.5 % (ref 0.0–5.0)
HCT: 41.1 % (ref 36.0–46.0)
Hemoglobin: 13.9 g/dL (ref 12.0–15.0)
Lymphocytes Relative: 39.1 % (ref 12.0–46.0)
Lymphs Abs: 2.3 10*3/uL (ref 0.7–4.0)
MCHC: 33.9 g/dL (ref 30.0–36.0)
MCV: 90.3 fl (ref 78.0–100.0)
MONO ABS: 0.5 10*3/uL (ref 0.1–1.0)
Monocytes Relative: 7.8 % (ref 3.0–12.0)
NEUTROS ABS: 3 10*3/uL (ref 1.4–7.7)
Neutrophils Relative %: 51.2 % (ref 43.0–77.0)
PLATELETS: 215 10*3/uL (ref 150.0–400.0)
RBC: 4.55 Mil/uL (ref 3.87–5.11)
RDW: 13.8 % (ref 11.5–15.5)
WBC: 5.9 10*3/uL (ref 4.0–10.5)

## 2015-12-19 LAB — C-REACTIVE PROTEIN: CRP: 0.1 mg/dL — ABNORMAL LOW (ref 0.5–20.0)

## 2015-12-19 LAB — SEDIMENTATION RATE: SED RATE: 6 mm/h (ref 0–30)

## 2015-12-19 MED ORDER — TRAMADOL HCL 50 MG PO TABS: 50.0000 mg | ORAL_TABLET | Freq: Four times a day (QID) | ORAL | Status: DC | PRN

## 2015-12-19 NOTE — Patient Instructions (Addendum)
Please go to the basement level to have your labs drawn.  We will fax the Tramadol prescription for pain to [facility], [address].   You have been scheduled for a CT scan of the abdomen and pelvis at [facility] ([address]---this is in the same building as Press [facility]).   You are scheduled on Monday 12-24-15 at 2:30 PM. You should arrive at 2:15 minutes prior to your appointment time for registration. Please follow the written instructions below on the day of your exam:  WARNING: IF YOU ARE ALLERGIC TO IODINE/X-RAY DYE, PLEASE NOTIFY RADIOLOGY IMMEDIATELY AT [phone]! YOU WILL BE GIVEN A 13 HOUR PREMEDICATION PREP.  1) Do not eat  anything after 11:30 am(4 hours prior to your test) 2) You have been given 2 bottles of oral contrast to drink. The solution may taste better if refrigerated, but do NOT add ice or any other liquid to this solution. Shake well before drinking.    Drink 1 bottle of contrast @ 12:30 PM (2 hours prior to your exam)  Drink 1 bottle of contrast @ 1:30 PM (1 hour prior to your exam)  You may take any medications as prescribed with a small amount of water except for the following: Metformin, Glucophage, Glucovance, Avandamet, Riomet, Fortamet, Actoplus Met, Janumet, Glumetza or Metaglip. The above medications must be held the day of the exam AND 48 hours after the exam.  The purpose of you drinking the oral contrast is to aid in the visualization of your intestinal tract. The contrast solution may cause some diarrhea. Before your exam is started, you will be given a small amount of fluid to drink. Depending on your individual set of symptoms, you may also receive an intravenous injection of x-ray contrast/dye. Plan on being at [facility] for 30 minutes or long, depending on the type of exam you are having performed.  If you have any questions regarding your exam or if you need to reschedule, you may call the CT department at  [phone] between the hours of 8:00 am and 5:00 pm, Monday-Friday.  ________________________________________________________________________

## 2015-12-19 NOTE — Progress Notes (Signed)
Reviewed and agree with documentation and assessment and plan. K. Veena Tom Ragsdale , MD   

## 2015-12-19 NOTE — Progress Notes (Signed)
Patient ID: [name], female   DOB: Aug 02, 1945, 70 y.o.   MRN: [id number]   Subjective:    Patient ID: [name], female    DOB: 23-Jan-1946, 70 y.o.   MRN: [id number]  HPI  [name] is a pleasant 70 year old white female former patient of Dr. Delfin Thomas who comes in today with complaints of 6 months of fairly constant left-sided abdominal pain. Patient has history of diverticulosis and 2 prior episodes of diverticulitis. She also has history of breast cancer, is followed by Dr. Burr Thomas. She is status post appendectomy and cholecystectomy lysis of adhesions bilateral tubal ligation. Last colonoscopy was done in January 2013 1 diminutive polyp removed from the rectum which was hyperplastic and also noted moderate sigmoid diverticulosis. She should have 5 year interval follow-up due to family history She says that this pain feels similar to diverticulitis but she was given a course of Cipro and Flagyl about 6 months ago with no change at all in her symptoms and took a second course of antibiotics a few weeks ago again with no change in symptoms. Her pain at this point is constant but has not been progressive. She describes it as a deep ache. To be somewhat worse at the end of the day after she's been up and around all day. She says sometimes it does keep her from sleeping. She's not noticed any change with by mouth intake. She's not had any change in her bowel habits no melena or hematochezia and no fever or chills. Family history is positive for colon cancer in her mother.  Review of Systems Pertinent positive and negative review of systems were noted in the above HPI section.  All other review of systems was otherwise negative.  Outpatient Encounter Prescriptions as of 12/19/2015  Medication Sig  . Calcium Carbonate (CALCIUM 600 PO) Take 2 capsules by mouth daily.  . ferrous sulfate 325 (65 FE) MG tablet Take 325 mg by mouth daily with breakfast.  . Ascorbic Acid (VITAMIN C) 1000 MG tablet Take  1,000 mg by mouth daily.    Marland Kitchen aspirin 81 MG tablet Take 81 mg by mouth daily.  Marland Kitchen atenolol (TENORMIN) 50 MG tablet Take 50 mg by mouth daily.  . Cholecalciferol (VITAMIN D) 2000 UNITS tablet Take 2,000 Units by mouth daily.  Marland Kitchen exemestane (AROMASIN) 25 MG tablet Take 1 tablet (25 mg total) by mouth daily.  Marland Kitchen omeprazole (PRILOSEC) 40 MG capsule Take 40 mg by mouth daily.  . simvastatin (ZOCOR) 80 MG tablet Take 40 mg by mouth at bedtime.   . traMADol (ULTRAM) 50 MG tablet Take 1 tablet (50 mg total) by mouth every 6 (six) hours as needed.  . vitamin E 400 UNIT capsule Take 400 Units by mouth daily.     No facility-administered encounter medications on file as of 12/19/2015.   No Known Allergies Patient Active Problem List   Diagnosis Date Noted  . Osteopenia 11/07/2015  . Pneumothorax, left 07/08/2012  . SOB (shortness of breath) 07/08/2012  . Chest pain 07/08/2012  . Cancer of central portion of female breast ([name]) 06/18/2012  . Invasive ductal carcinoma of breast ([name])   . Arthritis   . Depression   . Heart murmur   . Hypertension   . Cancer of upper-outer quadrant of female breast ([name]) 05/24/2012   Social History   Social History  . Marital Status: Legally Separated    Spouse Name: N/A  . Number of Children: N/A  . Years of Education:  N/A   Occupational History  . Not on file.   Social History Main Topics  . Smoking status: Never Smoker   . Smokeless tobacco: Never Used  . Alcohol Use: 0.6 oz/week    1 Glasses of wine per week     Comment: 1 glass of Wine per Week  . Drug Use: No  . Sexual Activity: Not Currently    Birth Control/ Protection: Post-menopausal     Comment: Menarche age 23, parity age 66, G61, P74, Menopause age 63, No HRT   Other Topics Concern  . Not on file   Social History Narrative    [name]'s family history includes Breast cancer (age of onset: 26) in her sister; Colon cancer (age of onset: 56) in her mother; Prostate cancer (age of onset:  70) in her father; Stomach cancer (age of onset: 56) in her maternal grandmother.      Objective:    Filed Vitals:   12/19/15 1022  BP: 132/76  Pulse: 66    Physical Exam Well-developed older white female in no acute distress, very pleasant blood pressure 132/76 pulse 66, height 5 foot 2, weight 170. HEENT; nontraumatic normocephalic EOMI PERRLA sclera anicteric, Cardiovascular; regular rate and rhythm with S1-S2 no murmur or gallop, Pulmonary; clear bilaterally, Abdomen ;soft, bowel sounds are present she is tender in the left lower and left mid quadrant there is no guarding or rebound no palpable mass or hepatosplenomegaly, Rectal; exam not done, Extremities; no clubbing cyanosis or edema skin warm and dry, Neuropsych ;mood and affect appropriate    Assessment & Plan:   #72 70 year old white female with history of diverticulitis now presenting with 6 month history of constant left lower and left mid quadrant abdominal pain and no response to 2 courses of antibiotics over the past few months. Etiology of her pain is not clear, and atypical for diverticulitis we'll rule out occult colon lesion versus other intra-abdominal inflammatory process. #2 status post appendectomy, cholecystectomy , bilateral tubal ligation and lysis of adhesions #3 family history of colon cancer in patient's mother  Plan; we'll check CBC with differential, be met, sedimentation rate and CRP Schedule for CT of the abdomen and pelvis with contrast Tramadol 50 mg every 6 hours when necessary for pain #40 and no refills Further plans pending results of CT. Patient will be established with Dr. Ancil Linsey PA-C 12/19/2015   Cc: Kelli Low, MD

## 2015-12-21 ENCOUNTER — Telehealth: Payer: Self-pay | Admitting: Physician Assistant

## 2015-12-21 NOTE — Telephone Encounter (Signed)
See results note. 

## 2015-12-21 NOTE — Telephone Encounter (Signed)
Left a message for patient to call back. 

## 2015-12-24 ENCOUNTER — Ambulatory Visit (INDEPENDENT_AMBULATORY_CARE_PROVIDER_SITE_OTHER)
Admission: RE | Admit: 2015-12-24 | Discharge: 2015-12-24 | Disposition: A | Discharge status: Home / Self Care | Payer: 59 | Source: Ambulatory Visit | Attending: Physician Assistant | Admitting: Physician Assistant

## 2015-12-24 DIAGNOSIS — R1032 Left lower quadrant pain: Secondary | ICD-10-CM

## 2016-01-10 ENCOUNTER — Ambulatory Visit
Admission: RE | Admit: 2016-01-10 | Discharge: 2016-01-10 | Disposition: A | Discharge status: Home / Self Care | Payer: 59 | Source: Ambulatory Visit | Attending: Internal Medicine | Admitting: Internal Medicine

## 2016-01-10 ENCOUNTER — Other Ambulatory Visit: Payer: Self-pay | Admitting: Internal Medicine

## 2016-01-10 DIAGNOSIS — R102 Pelvic and perineal pain: Secondary | ICD-10-CM

## 2016-01-10 DIAGNOSIS — M25552 Pain in left hip: Secondary | ICD-10-CM

## 2016-02-25 MED FILL — EXEMESTANE 25 MG TABLET: 25 | 30 days supply | Qty: 30 | Fill #1

## 2016-02-27 ENCOUNTER — Ambulatory Visit: Payer: 59 | Admitting: Physician Assistant

## 2016-03-12 ENCOUNTER — Ambulatory Visit: Payer: 59 | Admitting: Nurse Practitioner

## 2016-03-26 ENCOUNTER — Ambulatory Visit (INDEPENDENT_AMBULATORY_CARE_PROVIDER_SITE_OTHER): Payer: PRIVATE HEALTH INSURANCE | Admitting: Nurse Practitioner

## 2016-03-26 ENCOUNTER — Encounter: Payer: Self-pay | Admitting: Nurse Practitioner

## 2016-03-26 VITALS — BP 132/72 | HR 64 | Ht 60.25 in | Wt 164.0 lb

## 2016-03-26 DIAGNOSIS — R194 Change in bowel habit: Secondary | ICD-10-CM | POA: Diagnosis not present

## 2016-03-26 DIAGNOSIS — Z800 Family history of malignant neoplasm of digestive organs: Secondary | ICD-10-CM

## 2016-03-26 DIAGNOSIS — R1032 Left lower quadrant pain: Secondary | ICD-10-CM

## 2016-03-26 MED ORDER — NA SULFATE-K SULFATE-MG SULF 17.5-3.13-1.6 GM/177ML PO SOLN: 1.0000 | Freq: Once | ORAL | 0 refills | Status: AC

## 2016-03-26 NOTE — Progress Notes (Addendum)
HPI: This is a 70 year old female formerly followed by [name], most recently established with [name]. She saw [name], [name], in our office in July for LLQ pain. Known history of diverticulitis but no response to 2 courses of antibiotics so pain not felt to represent recurrent diverticulitis. She was sent for CTscan which was unrevealing.  She has continued to have the pain, it is worse in am. Walks a lot at work and this increases the pain. Vacuuming intensifies the pain. Resting helps.  She has a normal stool in the morning followed a little later by a couple of loose BMs. No fevers, unusual weight loss,  No rectal bleeding. Just had a physical. Labs okay per patient.    Past Medical History:  Diagnosis Date  . Arthritis   . Depression   . Diverticulosis   . GERD (gastroesophageal reflux disease)   . Heart murmur    heard one when she was pregnamt-maybe had echo-cannot remember  . Hyperlipidemia   . Hyperplastic colon polyp   . Hypertension   . Invasive ductal carcinoma of breast ([place]) 2013   Left  . Iron deficiency anemia   . S/P radiation therapy  6-30 to 01-12-13                                                       6-30 to 01-12-13                                                         , Left Breast boost / 10 Gy in 5 fractions                    . Spinal stenosis   . Status post chemotherapy    chemotherapy consisting of [place] on 07/14/12 with 6 cycles planned. Every 3 week Herceptin starting 11/03/12.    Patient's surgical history, family medical history, social history, medications and allergies were all reviewed in Epic    Physical Exam: BP 132/72 (BP Location: Left Arm, Patient Position: Sitting, Cuff Size: Normal)   Pulse 64   Ht 5' 0.25" (1.53 m) Comment: height measured without shoes  Wt 164 lb (74.4 kg)   BMI 31.76 kg/m   GENERAL:  Well developed white female in NAD PSYCH: :Pleasant, cooperative, normal affect HEENT: Normocephalic, conjunctiva  pink, mucous membranes moist, neck supple without masses CARDIAC:  RRR,  No peripheral edema PULM: Normal respiratory effort, lungs CTA bilaterally, no wheezing ABDOMEN:  soft, nontender, nondistended, no obvious masses, no hepatomegaly,  normal bowel sounds SKIN:  turgor, no lesions seen NEURO: Alert and oriented x 3, no focal neurologic deficits  ASSESSMENT and PLAN:  58. 70 year old female with persistent LLQ pain since July. Pain exacerbated by walking a lot and other activities such as vacuuming. Hx of diverticulitis but no response to antibiotics in July and CTscan then was unrevealing. .She reports bowel habit changes consisting of a normal BM in am followed later by 2-3 stools of looser consistency. Ongoing pain and bowel changes are concerning for patient, her mother had colon cancer. I did explain that  LLQ pain likely musculoskeletal in nature and her bowel pattern is likely constipation but patient due soon for surveillance colonoscopy anyway.     2. [facility] of colon cancer in mother, due for surveillance colonoscopy in January. Patient will be scheduled for a  colonoscopy with possible polypectomy to be done by [name]. The risks and benefits of the procedure were discussed and the patient agrees to proceed.    Addendum 03/07/16 Records from PCP received. Labs drawn 02/13/16 revealed  normal white count of 5.2 normal hemoglobin 13.5, normal MCV 91.  Normal renal function,  normal TSH, normal LFTs Left hip x-ray reveals osseous demineralization without acute left hip abnormalities and degenerative disc disease over the lumbar spine

## 2016-03-26 NOTE — Patient Instructions (Signed)
If you are age 72 or older, your body mass index should be between 23-30. Your Body mass index is 31.76 kg/m. If this is out of the aforementioned range listed, please consider follow up with your Primary Care Provider.  If you are age 20 or younger, your body mass index should be between 19-25. Your Body mass index is 31.76 kg/m. If this is out of the aformentioned range listed, please consider follow up with your Primary Care Provider.   You have been scheduled for a colonoscopy. Please follow written instructions given to you at your visit today.  Please pick up your prep supplies at the pharmacy within the next 1-3 days. If you use inhalers (even only as needed), please bring them with you on the day of your procedure. Your physician has requested that you go to www.startemmi.com and enter the access code given to you at your visit today. This web site gives a general overview about your procedure. However, you should still follow specific instructions given to you by our office regarding your preparation for the procedure.  Thank you for choosing [facility]

## 2016-03-28 NOTE — Progress Notes (Signed)
Reviewed and agree with documentation and assessment and plan. K. Veena Nandigam , MD   

## 2016-03-31 ENCOUNTER — Other Ambulatory Visit: Payer: Self-pay | Admitting: Hematology

## 2016-03-31 DIAGNOSIS — C50412 Malignant neoplasm of upper-outer quadrant of left female breast: Secondary | ICD-10-CM

## 2016-04-01 MED FILL — EXEMESTANE 25 MG TABLET: 25 | 30 days supply | Qty: 30 | Fill #2

## 2016-04-30 ENCOUNTER — Encounter: Payer: Self-pay | Admitting: Gastroenterology

## 2016-04-30 ENCOUNTER — Ambulatory Visit (AMBULATORY_SURGERY_CENTER): Payer: PRIVATE HEALTH INSURANCE | Admitting: Gastroenterology

## 2016-04-30 VITALS — BP 127/81 | HR 64 | Temp 97.5°F | Resp 17 | Ht 60.0 in | Wt 164.0 lb

## 2016-04-30 DIAGNOSIS — D122 Benign neoplasm of ascending colon: Secondary | ICD-10-CM | POA: Diagnosis not present

## 2016-04-30 DIAGNOSIS — R194 Change in bowel habit: Secondary | ICD-10-CM

## 2016-04-30 DIAGNOSIS — R1032 Left lower quadrant pain: Secondary | ICD-10-CM

## 2016-04-30 DIAGNOSIS — D120 Benign neoplasm of cecum: Secondary | ICD-10-CM

## 2016-04-30 MED ORDER — SODIUM CHLORIDE 0.9 % IV SOLN: 500.0000 mL | INTRAVENOUS | Status: DC

## 2016-04-30 NOTE — Patient Instructions (Signed)
Impressions/recommendations:  Polyps (handout given) Diverticulosis (handout given) High Fiber Diet (handout given) Hemorrhoids (handout given)  No ibuprofen, naproxen, or other non-steroidal anti-inflammatory agents for 2 weeks. Tylenol only if needed until 05/16/16.  YOU HAD AN ENDOSCOPIC PROCEDURE TODAY AT [facility]:   Refer to the procedure report that was given to you for any specific questions about what was found during the examination.  If the procedure report does not answer your questions, please call your gastroenterologist to clarify.  If you requested that your care partner not be given the details of your procedure findings, then the procedure report has been included in a sealed envelope for you to review at your convenience later.  YOU SHOULD EXPECT: Some feelings of bloating in the abdomen. Passage of more gas than usual.  Walking can help get rid of the air that was put into your GI tract during the procedure and reduce the bloating. If you had a lower endoscopy (such as a colonoscopy or flexible sigmoidoscopy) you may notice spotting of blood in your stool or on the toilet paper. If you underwent a bowel prep for your procedure, you may not have a normal bowel movement for a few days.  Please Note:  You might notice some irritation and congestion in your nose or some drainage.  This is from the oxygen used during your procedure.  There is no need for concern and it should clear up in a day or so.  SYMPTOMS TO REPORT IMMEDIATELY:   Following lower endoscopy (colonoscopy or flexible sigmoidoscopy):  Excessive amounts of blood in the stool  Significant tenderness or worsening of abdominal pains  Swelling of the abdomen that is new, acute  Fever of 100F or higher  For urgent or emergent issues, a gastroenterologist can be reached at any hour by calling [phone].   DIET:  We do recommend a small meal at first, but then you may proceed to your regular  diet.  Drink plenty of fluids but you should avoid alcoholic beverages for 24 hours.  ACTIVITY:  You should plan to take it easy for the rest of today and you should NOT DRIVE or use heavy machinery until tomorrow (because of the sedation medicines used during the test).    FOLLOW UP: Our staff will call the number listed on your records the next business day following your procedure to check on you and address any questions or concerns that you may have regarding the information given to you following your procedure. If we do not reach you, we will leave a message.  However, if you are feeling well and you are not experiencing any problems, there is no need to return our call.  We will assume that you have returned to your regular daily activities without incident.  If any biopsies were taken you will be contacted by phone or by letter within the next 1-3 weeks.  Please call us at [phone] if you have not heard about the biopsies in 3 weeks.    SIGNATURES/CONFIDENTIALITY: You and/or your care partner have signed paperwork which will be entered into your electronic medical record.  These signatures attest to the fact that that the information above on your After Visit Summary has been reviewed and is understood.  Full responsibility of the confidentiality of this discharge information lies with you and/or your care-partner.

## 2016-04-30 NOTE — Op Note (Signed)
[place] Patient Name: [name] Procedure Date: 04/30/2016 1:59 PM MRN: [id number] Endoscopist: Remo Lipps P. Son Barkan MD, MD Age: 70 Referring MD:  Date of Birth: Nov 09, 1945 Gender: Female Account #: [id number] Procedure:                Colonoscopy Indications:              Abdominal pain in the left lower quadrant,                            intermittent loose stools Medicines:                Monitored Anesthesia Care Procedure:                Pre-Anesthesia Assessment:                           - Prior to the procedure, a History and Physical                            was performed, and patient medications and                            allergies were reviewed. The patient's tolerance of                            previous anesthesia was also reviewed. The risks                            and benefits of the procedure and the sedation                            options and risks were discussed with the patient.                            All questions were answered, and informed consent                            was obtained. Prior Anticoagulants: The patient has                            taken aspirin, last dose was 1 day prior to                            procedure. ASA Grade Assessment: II - A patient                            with mild systemic disease. After reviewing the                            risks and benefits, the patient was deemed in                            satisfactory condition to undergo the procedure.  After obtaining informed consent, the colonoscope                            was passed under direct vision. Throughout the                            procedure, the patient's blood pressure, pulse, and                            oxygen saturations were monitored continuously. The                            Model CF-HQ190L [id number]) scope was introduced                            through the anus and advanced to  the the terminal                            ileum, with identification of the appendiceal                            orifice and IC valve. The colonoscopy was performed                            without difficulty. The patient tolerated the                            procedure well. The quality of the bowel                            preparation was adequate. The terminal ileum,                            ileocecal valve, appendiceal orifice, and rectum                            were photographed. Scope In: 2:05:54 PM Scope Out: 2:28:24 PM Scope Withdrawal Time: 0 hours 15 minutes 27 seconds  Total Procedure Duration: 0 hours 22 minutes 30 seconds  Findings:                 The perianal and digital rectal examinations were                            normal.                           Many medium-mouthed diverticula were found in the                            left colon.                           A 5 mm polyp was found in the cecum. The polyp was  sessile. The polyp was removed with a cold snare.                            Resection and retrieval were complete.                           A 7 mm polyp was found in the ascending colon. The                            polyp was sessile. The polyp was removed with a                            cold snare. Resection and retrieval were complete.                           The terminal ileum appeared normal.                           Internal hemorrhoids were found during                            retroflexion. The hemorrhoids were small.                           The exam was otherwise without abnormality.                           Biopsies for histology were taken with a cold                            forceps from the right colon and left colon for                            evaluation of microscopic colitis. Complications:            No immediate complications. Estimated blood loss:                             Minimal. Estimated Blood Loss:     Estimated blood loss was minimal. Impression:               - Diverticulosis in the left colon.                           - One 5 mm polyp in the cecum, removed with a cold                            snare. Resected and retrieved.                           - One 7 mm polyp in the ascending colon, removed                            with a cold snare. Resected and retrieved.                           -  The examined portion of the ileum was normal.                           - Internal hemorrhoids.                           - The examination was otherwise normal.                           - Biopsies were taken with a cold forceps from the                            right colon and left colon for evaluation of                            microscopic colitis.                           Overall, given patient's description of pain                            (chronic, present 24/7), (+) Carnett sign, I                            suspect her pain may be due to abdominal wall                            etiology / musculoskeletal. Symptomatic                            uncomplicated diverticular disease (SUDD) is also                            possible but seems less likely. Recommendation:           - Patient has a contact number available for                            emergencies. The signs and symptoms of potential                            delayed complications were discussed with the                            patient. Return to normal activities tomorrow.                            Written discharge instructions were provided to the                            patient.                           - Resume previous diet.                           -  Continue present medications.                           - No ibuprofen, naproxen, or other non-steroidal                            anti-inflammatory drugs for 2 weeks after polyp                             removal.                           - Await pathology results.                           - Repeat colonoscopy is recommended for                            surveillance. The colonoscopy date will be                            determined after pathology results from today's                            exam become available for review. Remo Lipps P. Tyese Finken MD, MD 04/30/2016 2:34:57 PM This report has been signed electronically.

## 2016-04-30 NOTE — Progress Notes (Signed)
Called to room to assist during endoscopic procedure.  Patient ID and intended procedure confirmed with present staff. Received instructions for my participation in the procedure from the performing physician.  

## 2016-05-01 ENCOUNTER — Telehealth: Payer: Self-pay

## 2016-05-01 NOTE — Telephone Encounter (Signed)
  Follow up Call-  Call back number 04/30/2016  Post procedure Call Back phone  # [phone]  Permission to leave phone message Yes  Some recent data might be hidden     Patient questions:  Do you have a fever, pain , or abdominal swelling? No. Pain Score  0 *  Have you tolerated food without any problems? Yes.    Have you been able to return to your normal activities? Yes.    Do you have any questions about your discharge instructions: Diet   No. Medications  No. Follow up visit  No.  Do you have questions or concerns about your Care? No.  Actions: * If pain score is 4 or above: No action needed, pain <4.

## 2016-05-04 MED FILL — EXEMESTANE 25 MG TABLET: 25 | 30 days supply | Qty: 30 | Fill #3

## 2016-05-07 ENCOUNTER — Encounter: Payer: Self-pay | Admitting: Gastroenterology

## 2016-05-14 ENCOUNTER — Other Ambulatory Visit: Payer: 59

## 2016-05-14 ENCOUNTER — Ambulatory Visit: Payer: 59 | Admitting: Hematology

## 2016-05-14 NOTE — Progress Notes (Deleted)
[place] Office follow-up note  ID: JENIA KLEPPER OB: Jun 19, 1945  MR#: [id number]  XNA#:[id number]  PCP: Wenda Low, MD GYN:   SU: Dr. Rolm Bookbinder OTHER MD: Dr. Isidore Moos, radiation oncology  CHIEF COMPLAINT:  70 y/o [place], [name] with T1 N1, stage IIA invasive ductal carcinoma of the left breast, grade II, ER 100%, PR 100%, Ki-67 of 55%, HER-2/neu AMPLIFIED.   BREAST CANCER HISTORY:  Patient palpated a left breast mass and underwent a mammogram in 05/2012 that demonstrated the mass.  It was biopsied on 05/14/12 and the pathology revealed triple positive, grade II, uinvasive ductal carcinoma with a Ki-67 of 55%  PREVIOUS THERAPY: 1. Patient underwent a left breast lumpectomy by Dr. Donne Hazel on 06/10/2012.  A grade III, 1.2 cm invasive ductal carcinoma was removed with lymphovascular invasion, DCIS with calcifications, and comedo necrosis, , in situ carcinoma was present at the anterior margin, one lymph node was positive for metastatic mammary carcinoma.    2. Patient received adjuvant chemotherapy consisting of Docetaxel, Carboplatin, Trastuzumab x 6 cycles from 07/14/2012 through 10/27/2012.  She then completed one year of adjuvant every 3 week Trastuzumab from 11/03/2012 through 07/13/2013.    3.  Patient underwent radiation therapy from 11/29/2012 through 01/17/2013.  4.  Following radiation therapy, the patient was started on adjuvant Letrozole, 2.44m PO daily.  A total of 5 years of therapy are planned.  CURRENT THERAPY: Letrozole 2.5 mg daily, changed to exemestane 261mdaily from 05/17/2015  INTERVAL HISTORY: Ms. WaMedines a [age]ear old female with triple positive invasive ductal carcinoma of the left breast.  She is here today for follow up.  She was last seen by me 6 months ago.  She is doing well overall, she changed from letrozole to exemestane 6 months ago, tolerating very well, no significant hot flush or other side effects. Her arthritis in neck also  slightly improved since she changed medication. She has had several injections to the neck, she still has some difficulty and pain when she moves her head. She been following with her orthopedic surgeon. No other significant pain or discomfort. She otherwise doing well, she walks her dog every day, weight is stable.    REVIEW OF SYSTEMS:  A 10 point review of systems was conducted and is otherwise negative except for what is noted above.     PAST MEDICAL HISTORY: Past Medical History:  Diagnosis Date  . Arthritis   . Depression   . Diverticulosis   . GERD (gastroesophageal reflux disease)   . Heart murmur    heard one when she was pregnamt-maybe had echo-cannot remember  . Hyperlipidemia   . Hyperplastic colon polyp   . Hypertension   . Invasive ductal carcinoma of breast ([id number]   Left  . Iron deficiency anemia   . S/P radiation therapy  6-30 to 01-12-13                                                       6-30 to 01-12-13                                                         ,  Left Breast boost / 10 Gy in 5 fractions                    . Spinal stenosis   . Status post chemotherapy    chemotherapy consisting of High [place] on 07/14/12 with 6 cycles planned. Every 3 week Herceptin starting 11/03/12.    PAST SURGICAL HISTORY: Past Surgical History:  Procedure Laterality Date  . ABDOMINAL ADHESION SURGERY  1980  . APPENDECTOMY    . Benign Breast Biopsy  1994   Left  . BLADDER SUSPENSION  10/2010  . BREAST LUMPECTOMY WITH NEEDLE LOCALIZATION AND AXILLARY SENTINEL LYMPH NODE BX  06/10/2012   Procedure: BREAST LUMPECTOMY WITH NEEDLE LOCALIZATION AND AXILLARY SENTINEL LYMPH NODE BX;  Surgeon: Rolm Bookbinder, MD;  Location: [facility];  Service: General;  Laterality: Left;  . CHOLECYSTECTOMY    . PORTACATH PLACEMENT  07/07/2012   Procedure: INSERTION PORT-A-CATH;  Surgeon: Rolm Bookbinder, MD;  Location: [facility];  Service: General;  Laterality: Left;  Port a  cath insertion  . TONSILLECTOMY AND ADENOIDECTOMY     age 70  . TUBAL LIGATION  1988    FAMILY HISTORY Family History  Problem Relation Age of Onset  . Colon cancer Mother 37  . Breast cancer Sister 22  . Prostate cancer Father 13  . Stomach cancer Maternal Grandmother 67    GYNECOLOGIC HISTORY: (updated 04/19/2014) Menarche at age 34, G19 P2, patient took oral HRT x 2 years in the 85s.  Patient has not had any abnormal pap smears, she has no h/o sexually transmitted infections.   SOCIAL HISTORY: (Updated 04/19/2014) Lives with boyfriend [name] in a town home in [place], [name].  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      ADVANCED DIRECTIVES:  (Updated 04/19/2014) In place.  Health care power of attorney is [name] her daughter, We do not have copies, and the patient will bring these in for [place] to scan.  Dr. Glenna Durand office does have copies.     HEALTH MAINTENANCE: Social History  Substance Use Topics  . Smoking status: Never Smoker  . Smokeless tobacco: Never Used  . Alcohol use 0.6 oz/week    1 Glasses of wine per week     Comment: 1 glass of Wine per Week      Mammogram: 11/29/2014 Colonoscopy: 2013, 5 year f/u recommended Bone Density Scan: 10/2013, osteopenia Pap Smear:  Unsure when last one was Eye Exam: 2 years ago  Vitamin D Level:  Next appt Lipid Panel: 2013   No Known Allergies  Current Outpatient Prescriptions  Medication Sig Dispense Refill  . Ascorbic Acid (VITAMIN C) 1000 MG tablet Take 1,000 mg by mouth daily.      Marland Kitchen aspirin 81 MG tablet Take 81 mg by mouth daily.    Marland Kitchen atenolol (TENORMIN) 50 MG tablet Take 50 mg by mouth daily.    . Calcium Carbonate (CALCIUM 600 PO) Take 2 capsules by mouth daily.    . Cholecalciferol (VITAMIN D) 2000 UNITS tablet Take 2,000 Units by mouth daily.    Marland Kitchen exemestane (AROMASIN) 25 MG tablet Take 1 tablet (25 mg total) by mouth daily. 90 tablet 2  .  ferrous sulfate 325 (65 FE) MG tablet Take 325 mg by mouth daily with breakfast.    . omeprazole (PRILOSEC) 40 MG capsule Take 40 mg by mouth daily.    . simvastatin (ZOCOR) 80  MG tablet Take 40 mg by mouth at bedtime.     . traMADol (ULTRAM) 50 MG tablet Take 1 tablet (50 mg total) by mouth every 6 (six) hours as needed. 40 tablet 0  . vitamin E 400 UNIT capsule Take 400 Units by mouth daily.       Current Facility-Administered Medications  Medication Dose Route Frequency Provider Last Rate Last Dose  . 0.9 %  sodium chloride infusion  500 mL Intravenous Continuous Manus Gunning, MD        OBJECTIVE: There were no vitals filed for this visit.   There is no height or weight on file to calculate BMI.      GENERAL: Patient is a well appearing female in no acute distress HEENT:  Sclerae anicteric.  Oropharynx clear and moist. No ulcerations or evidence of oropharyngeal candidiasis. Neck is supple.  NODES:  No cervical, supraclavicular, or axillary lymphadenopathy palpated.  BREAST EXAM:  Right breast, no masses, nodules, skin or nipple changes, left breast s/p lumpectomy, Radiation thickness noted, no nodularity, or sign of recurrence. Benign bilateral breast exam.   LUNGS:  Clear to auscultation bilaterally.  No wheezes or rhonchi. HEART:  Regular rate and rhythm. No murmur appreciated. ABDOMEN:  Soft, nontender.  Positive, normoactive bowel sounds. No organomegaly palpated. MSK:  No focal spinal tenderness to palpation. Full range of motion bilaterally in the upper extremities. EXTREMITIES:  No peripheral edema.   SKIN:  Clear with no obvious rashes or skin changes. No nail dyscrasia. NEURO:  Nonfocal. Well oriented.  Appropriate affect. ECOG FS:1 - Symptomatic but completely ambulatory  LAB RESULTS: CBC Latest Ref Rng & Units 12/19/2015 11/07/2015 05/16/2015  WBC 4.0 - 10.5 K/uL 5.9 5.7 5.8  Hemoglobin 12.0 - 15.0 g/dL 13.9 13.3 12.4  Hematocrit 36.0 - 46.0 % 41.1 40.4 37.8   Platelets 150.0 - 400.0 K/uL 215.0 171 231    CMP Latest Ref Rng & Units 12/19/2015 11/07/2015 05/16/2015  Glucose 70 - 99 mg/dL 81 99 82  BUN 6 - 23 mg/dL 24(H) 18.7 25.0  Creatinine 0.40 - 1.20 mg/dL 0.95 1.1 1.1  Sodium 135 - 145 mEq/L 141 145 141  Potassium 3.5 - 5.1 mEq/L 4.6 4.5 4.5  Chloride 96 - 112 mEq/L 104 - -  CO2 19 - 32 mEq/L _0 Calcium 8.4 - 10.5 mg/dL 10.5 10.3 10.1  Total Protein 6.4 - 8.3 g/dL - 7.1 7.3  Total Bilirubin 0.20 - 1.20 mg/dL - 0.87 0.47  Alkaline Phos 40 - 150 U/L - 62 74  AST 5 - 34 U/L - 21 21  ALT 0 - 55 U/L - 20 21     STUDIES: Diagnostic mammogram bilateral 12/05/2015 IMPRESSION: The left breast lumpectomy site is stable. No mammographic evidence of malignancy in the bilateral breasts.  RECOMMENDATION: Diagnostic mammogram is suggested in 1 year. (Code:DM-B-01Y)  DEXA 12/05/2015 ASSESSMENT: The BMD measured at Femur Neck Left is 0.749 g/cm2 with a T-score of -2.1. This patient is considered osteopenic according to [facility]) criteria. L-2, L-3 were excluded due to degenerative changes. Spine was not compared to prior study due to exclusion of vertebral bodies on current exam. There has been a statistically significant decrease in BMD of Left hip since prior exam dated 11/14/2013.  Major Osteoporotic Fracture: 12.3% Hip Fracture:                2.7% Population:  [place] (Caucasian) Risk Factors:                Secondary Osteoporosis  ASSESSMENT: 70 y.o. [place], [name] with   1. pT1cN1M0, stage IIA invasive ductal carcinoma of the left breast, grade II, ER 100%, PR 100%, Ki-67 of 55%, HER-2/neu positive.  -She is clinically doing very well, her last mammogram and today's physical exam children no evidence of local recurrence. -Lab reviewed, CBC normal, CMP still pending. -She has been tolerating exemestane very well, we'll continue. Given her stage II disease high risk, if she tolerates well,  we may consider extend her adjuvant endocrine therapy to 10 years. -She'll continue annual mammogram, she is due for this month  -I encouraged her to continue healthy diet and regular exercise.   2. Osteopenia -Her last bone density scan was in June 2015 -She'll continue taking calcium and vitamin D -We discussed that letrozole and exemestane may make her osteopenia worse. -Will repeat bone density scan in June 2017   3. Severe arthritis -She is seen orthopedic surgeon and neurosurgeon.   4. Thyroid nodule -Her biopsy in January 2017 was negative for malignant cells. -She'll follow-up with her primary care physician  Plan -Continue exemestane, I refilled for her today -return in 6 months for labs and evaluation -mammogram and bone density scan in June 2017.     She knows to call us in the interim for any questions or concerns.  We can certainly see her sooner if needed.  I spent 25 minutes counseling the patient face to face.  The total time spent in the appointment was 30 minutes.  Truitt Merle   05/14/2016 6:42 AM

## 2016-05-27 MED FILL — EXEMESTANE 25 MG TABLET: 25 | 90 days supply | Qty: 90 | Fill #0

## 2016-06-09 ENCOUNTER — Other Ambulatory Visit: Payer: Self-pay | Admitting: Surgery

## 2016-06-09 DIAGNOSIS — E042 Nontoxic multinodular goiter: Secondary | ICD-10-CM

## 2016-06-12 ENCOUNTER — Other Ambulatory Visit: Payer: Self-pay

## 2016-06-17 NOTE — Progress Notes (Signed)
[place] Office follow-up note  ID: [name] OB: 22-Sep-1945  MR#: [id number]  FXT#:[id number]  PCP: Kelli Low, MD GYN:   SU: Dr. Rolm Thomas OTHER MD: Dr. Isidore Moos, radiation oncology  CHIEF COMPLAINT:  71 y/o [name], [name] with T1 N1, stage IIA invasive ductal carcinoma of the left breast, grade II, ER 100%, PR 100%, Ki-67 of 55%, HER-2/neu AMPLIFIED.   BREAST CANCER HISTORY:  Patient palpated a left breast mass and underwent a mammogram in 05/2012 that demonstrated the mass.  It was biopsied on 05/14/12 and the pathology revealed triple positive, grade II, uinvasive ductal carcinoma with a Ki-67 of 55%  PREVIOUS THERAPY: 1. Patient underwent a left breast lumpectomy by [name] on 06/10/2012.  A grade III, 1.2 cm invasive ductal carcinoma was removed with lymphovascular invasion, DCIS with calcifications, and comedo necrosis, , in situ carcinoma was present at the anterior margin, one lymph node was positive for metastatic mammary carcinoma.    2. Patient received adjuvant chemotherapy consisting of Docetaxel, Carboplatin, Trastuzumab x 6 cycles from 07/14/2012 through 10/27/2012.  She then completed one year of adjuvant every 3 week Trastuzumab from 11/03/2012 through 07/13/2013.    3.  Patient underwent radiation therapy from 11/29/2012 through 01/17/2013.  4.  Following radiation therapy, the patient was started on adjuvant Letrozole, 2.49m PO daily.  A total of 5 years of therapy are planned.  CURRENT THERAPY: Letrozole 2.5 mg daily started in 01/2013, changed to exemestane 232mdaily from 05/17/2015  INTERVAL HISTORY: Ms. [name] a [age]ear old female with triple positive invasive ductal carcinoma of the left breast.  She is here today for follow up. She is doing very well overall, has no noticeable side effects from exemestane. She has good appetite and energy level, remains to be physically active, but does not exercise regularly. She has a  9-84ear-old grandson who lives with her. She has mild arthritis pain in Thomas back, does not affect her activities.   REVIEW OF SYSTEMS:   Constitutional: Denies fevers, chills or abnormal night sweats Eyes: Denies blurriness of vision, double vision or watery eyes Ears, nose, mouth, throat, and face: Denies mucositis or sore throat Respiratory: Denies cough, dyspnea or wheezes Cardiovascular: Denies palpitation, chest discomfort or lower extremity swelling Gastrointestinal:  Denies nausea, heartburn or change in bowel habits Skin: Denies abnormal skin rashes Lymphatics: Denies new lymphadenopathy or easy bruising Neurological:Denies numbness, tingling or new weaknesses Behavioral/Psych: Mood is stable, no new changes  All other systems were reviewed with the patient and are negative. A 10 point review of systems was conducted and is otherwise negative except for what is noted above.     PAST MEDICAL HISTORY: Past Medical History:  Diagnosis Date  . Arthritis   . Depression   . Diverticulosis   . GERD (gastroesophageal reflux disease)   . Heart murmur    heard one when she was pregnamt-maybe had echo-cannot remember  . Hyperlipidemia   . Hyperplastic colon polyp   . Hypertension   . [place]   [place]  . Iron deficiency anemia   . S/P radiation therapy  6-30 to 01-12-13                                                       6-30  to 01-12-13                                                         , Left Breast boost / 10 Gy in 5 fractions                    . Spinal stenosis   . Status post chemotherapy    chemotherapy consisting of [place] on 07/14/12 with 6 cycles planned. Every 3 week Herceptin starting 11/03/12.    PAST SURGICAL HISTORY: Past Surgical History:  Procedure Laterality Date  . ABDOMINAL ADHESION SURGERY  1980  . APPENDECTOMY    . Benign Breast Biopsy  1994   Left  . BLADDER SUSPENSION  10/2010  . BREAST LUMPECTOMY WITH NEEDLE  LOCALIZATION AND AXILLARY SENTINEL LYMPH NODE BX  06/10/2012   Procedure: BREAST LUMPECTOMY WITH NEEDLE LOCALIZATION AND AXILLARY SENTINEL LYMPH NODE BX;  Surgeon: Kelli Bookbinder, MD;  Location: [facility];  Service: General;  Laterality: Left;  . CHOLECYSTECTOMY    . PORTACATH PLACEMENT  07/07/2012   Procedure: INSERTION PORT-A-CATH;  Surgeon: Kelli Bookbinder, MD;  Location: [facility];  Service: General;  Laterality: Left;  Port a cath insertion  . TONSILLECTOMY AND ADENOIDECTOMY     age 61  . TUBAL LIGATION  1988    FAMILY HISTORY Family History  Problem Relation Age of Onset  . Colon cancer Mother 17  . Breast cancer Sister 93  . Prostate cancer Father 68  . Stomach cancer Maternal Grandmother 67    GYNECOLOGIC HISTORY:  Menarche at age 20, G34 P2, patient took oral HRT x 2 years in the 45s.  Patient has not had any abnormal pap smears, she has no h/o sexually transmitted infections.   SOCIAL HISTORY:  Lives with boyfriend [name] in a town home in [place], [name].  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      ADVANCED DIRECTIVES:   In place.  Health care power of attorney is [name] her daughter, We do not have copies, and the patient will bring these in for [place] to scan.  Dr. Glenna Thomas office does have copies.     HEALTH MAINTENANCE: Social History  Substance Use Topics  . Smoking status: Never Smoker  . Smokeless tobacco: Never Used  . Alcohol use 0.6 oz/week    1 Glasses of wine per week     Comment: 1 glass of Wine per Week      Mammogram: 12/05/2015 Colonoscopy: 2013, 5 year f/u recommended Bone Density Scan: 12/2015, osteopenia Pap Smear:  Unsure when last one was Eye Exam: 2 years ago  Vitamin D Level:  Next appt Lipid Panel: 2013   No Known Allergies  Current Outpatient Prescriptions  Medication Sig Dispense Refill  . Ascorbic Acid (VITAMIN C) 1000 MG tablet  Take 1,000 mg by mouth daily.      Marland Kitchen aspirin 81 MG tablet Take 81 mg by mouth daily.    Marland Kitchen atenolol (TENORMIN) 50 MG tablet Take 50 mg by mouth daily.    . Calcium Carbonate (CALCIUM 600 PO) Take 2 capsules by mouth daily.    . Cholecalciferol (VITAMIN D) 2000 UNITS tablet Take 2,000 Units by mouth daily.    Marland Kitchen  exemestane (AROMASIN) 25 MG tablet Take 1 tablet (25 mg total) by mouth daily. 90 tablet 2  . ferrous sulfate 325 (65 FE) MG tablet Take 325 mg by mouth daily with breakfast.    . omeprazole (PRILOSEC) 40 MG capsule Take 40 mg by mouth daily.    . simvastatin (ZOCOR) 80 MG tablet Take 40 mg by mouth at bedtime.     . traMADol (ULTRAM) 50 MG tablet Take 1 tablet (50 mg total) by mouth every 6 (six) hours as needed. 40 tablet 0  . vitamin E 400 UNIT capsule Take 400 Units by mouth daily.       Current Facility-Administered Medications  Medication Dose Route Frequency Provider Last Rate Last Dose  . 0.9 %  sodium chloride infusion  500 mL Intravenous Continuous Manus Gunning, MD        OBJECTIVE: Vitals:   06/19/16 1239  BP: 140/72  Pulse: 78  Resp: 18  Temp: 98.1 F (36.7 C)     Body mass index is 31.01 kg/m.      GENERAL: Patient is a well appearing female in no acute distress HEENT:  Sclerae anicteric.  Oropharynx clear and moist. No ulcerations or evidence of oropharyngeal candidiasis. Neck is supple.  NODES:  No cervical, supraclavicular, or axillary lymphadenopathy palpated.  BREAST EXAM:  Right breast, no masses, nodules, skin or nipple changes, left breast s/p lumpectomy, Radiation thickness noted, no nodularity, or sign of recurrence. Benign bilateral breast exam.   LUNGS:  Clear to auscultation bilaterally.  No wheezes or rhonchi. HEART:  Regular rate and rhythm. No murmur appreciated. ABDOMEN:  Soft, nontender.  Positive, normoactive bowel sounds. No organomegaly palpated. MSK:  No focal spinal tenderness to palpation. Full range of motion bilaterally in the  upper extremities. EXTREMITIES:  No peripheral edema.   SKIN:  Clear with no obvious rashes or skin changes. No nail dyscrasia. NEURO:  Nonfocal. Well oriented.  Appropriate affect. ECOG FS:1 - Symptomatic but completely ambulatory  LAB RESULTS: CBC Latest Ref Rng & Units 06/19/2016 12/19/2015 11/07/2015  WBC 3.9 - 10.3 10e3/uL 7.0 5.9 5.7  Hemoglobin 11.6 - 15.9 g/dL 14.0 13.9 13.3  Hematocrit 34.8 - 46.6 % 41.9 41.1 40.4  Platelets 145 - 400 10e3/uL 181 215.0 171    CMP Latest Ref Rng & Units 06/19/2016 12/19/2015 11/07/2015  Glucose 70 - 140 mg/dl 137 81 99  BUN 7.0 - 26.0 mg/dL 27.9(H) 24(H) 18.7  Creatinine 0.6 - 1.1 mg/dL 1.1 0.95 1.1  Sodium 136 - 145 mEq/L 142 141 145  Potassium 3.5 - 5.1 mEq/L 3.9 4.6 4.5  Chloride 96 - 112 mEq/L - 104 -  CO2 22 - 29 mEq/L _0 Calcium 8.4 - 10.4 mg/dL 10.2 10.5 10.3  Total Protein 6.4 - 8.3 g/dL 7.0 - 7.1  Total Bilirubin 0.20 - 1.20 mg/dL 0.82 - 0.87  Alkaline Phos 40 - 150 U/L 75 - 62  AST 5 - 34 U/L 23 - 21  ALT 0 - 55 U/L 23 - 20     STUDIES: Bilateral diagnostic mammogram 12/05/2015 IMPRESSION: The left breast lumpectomy site is stable. No mammographic evidence of malignancy in the bilateral breasts.  RECOMMENDATION: Diagnostic mammogram is suggested in 1 year. (Code:[id number]-B-01Y).  Bone density scan 12/01/2015 The BMD measured at Femur Neck Left is 0.749 g/cm2 with a T-score of -2.1. This patient is considered osteopenic according to [facility]) criteria. L-2, L-3 were excluded due to degenerative changes. Spine was not  compared to prior study due to exclusion of vertebral bodies on current exam. There has been a statistically significant decrease in BMD of Left hip since prior exam dated 11/14/2013.  FRAX* 10-year Probability of Fracture Based on femoral neck BMD: DualFemur (Left)  Major Osteoporotic Fracture: 12.3% Hip Fracture:                2.7% Population:                  [place]  (Caucasian)   ASSESSMENT: 71 y.o. [place], [name] with   1. pT1cN1M0, stage IIA invasive ductal carcinoma of the left breast, grade II, ER 100%, PR 100%, Ki-67 of 55%, HER-2/neu positive.  -She is clinically doing very well, her last mammogram and today's physical exam revealed no evidence of recurrence. -Lab reviewed, CBC and CMP normal -She has been tolerating exemestane very well, we'll continue for at least a total of 5 years. Given her stage II disease high risk, if she tolerates well, we may consider extend her adjuvant endocrine therapy to 7 years.  -She'll continue annual mammogram -I encouraged her to continue healthy diet and regular exercise.   2. Osteopenia -Her last bone density scan was in June 2017, the T score of her left hip has dropped from -1.6 to -2.1 -She does not meet criteria for biphosphonate -She'll continue taking calcium and vitamin D, I'll check her vitamin D level on the next visit -We discussed that letrozole and exemestane may make her osteopenia worse. If her bone density continue getting worse, I may consider switching her exemestane to tamoxifen.  3. Back pain -Mild and tolerable -Continue follow-up with her orthopedic surgeon  4. Thyroid nodule -Her biopsy in January 2017 was negative for malignant cells. -She'll follow-up with her primary care physician  Plan -Continue exemestane, I refilled for her today -return in 6 months for labs and evaluation, will include VitD level on next lab  -mammogram in 11/2016.     She knows to call us in the interim for any questions or concerns.  We can certainly see her sooner if needed.  I spent 25 minutes counseling the patient face to face.  The total time spent in the appointment was 30 minutes.  Truitt Merle   06/19/2016

## 2016-06-19 ENCOUNTER — Other Ambulatory Visit (HOSPITAL_BASED_OUTPATIENT_CLINIC_OR_DEPARTMENT_OTHER): Payer: Medicare Other

## 2016-06-19 ENCOUNTER — Ambulatory Visit (HOSPITAL_BASED_OUTPATIENT_CLINIC_OR_DEPARTMENT_OTHER): Payer: Medicare Other | Admitting: Hematology

## 2016-06-19 ENCOUNTER — Encounter: Payer: Self-pay | Admitting: Hematology

## 2016-06-19 ENCOUNTER — Telehealth: Payer: Self-pay | Admitting: Hematology

## 2016-06-19 VITALS — BP 140/72 | HR 78 | Temp 98.1°F | Resp 18 | Ht 60.0 in | Wt 158.8 lb

## 2016-06-19 DIAGNOSIS — M8580 Other specified disorders of bone density and structure, unspecified site: Secondary | ICD-10-CM | POA: Diagnosis not present

## 2016-06-19 DIAGNOSIS — C50412 Malignant neoplasm of upper-outer quadrant of left female breast: Secondary | ICD-10-CM

## 2016-06-19 DIAGNOSIS — Z79811 Long term (current) use of aromatase inhibitors: Secondary | ICD-10-CM

## 2016-06-19 DIAGNOSIS — Z170 Estrogen receptor positive status [ER+]: Secondary | ICD-10-CM | POA: Diagnosis not present

## 2016-06-19 DIAGNOSIS — M1990 Unspecified osteoarthritis, unspecified site: Secondary | ICD-10-CM

## 2016-06-19 LAB — CBC WITH DIFFERENTIAL/PLATELET
BASO%: 0.5 % (ref 0.0–2.0)
BASOS ABS: 0 10*3/uL (ref 0.0–0.1)
EOS ABS: 0.1 10*3/uL (ref 0.0–0.5)
EOS%: 1 % (ref 0.0–7.0)
HEMATOCRIT: 41.9 % (ref 34.8–46.6)
HEMOGLOBIN: 14 g/dL (ref 11.6–15.9)
LYMPH#: 2.2 10*3/uL (ref 0.9–3.3)
LYMPH%: 31.6 % (ref 14.0–49.7)
MCH: 30.5 pg (ref 25.1–34.0)
MCHC: 33.3 g/dL (ref 31.5–36.0)
MCV: 91.6 fL (ref 79.5–101.0)
MONO#: 0.5 10*3/uL (ref 0.1–0.9)
MONO%: 6.5 % (ref 0.0–14.0)
NEUT#: 4.2 10*3/uL (ref 1.5–6.5)
NEUT%: 60.4 % (ref 38.4–76.8)
PLATELETS: 181 10*3/uL (ref 145–400)
RBC: 4.57 10*6/uL (ref 3.70–5.45)
RDW: 13.6 % (ref 11.2–14.5)
WBC: 7 10*3/uL (ref 3.9–10.3)

## 2016-06-19 LAB — COMPREHENSIVE METABOLIC PANEL
ALBUMIN: 4 g/dL (ref 3.5–5.0)
ALK PHOS: 75 U/L (ref 40–150)
ALT: 23 U/L (ref 0–55)
AST: 23 U/L (ref 5–34)
Anion Gap: 11 mEq/L (ref 3–11)
BUN: 27.9 mg/dL — AB (ref 7.0–26.0)
CALCIUM: 10.2 mg/dL (ref 8.4–10.4)
CO2: 26 mEq/L (ref 22–29)
Chloride: 105 mEq/L (ref 98–109)
Creatinine: 1.1 mg/dL (ref 0.6–1.1)
EGFR: 50 mL/min/{1.73_m2} — AB (ref >90)
Glucose: 137 mg/dl (ref 70–140)
POTASSIUM: 3.9 meq/L (ref 3.5–5.1)
Sodium: 142 mEq/L (ref 136–145)
Total Bilirubin: 0.82 mg/dL (ref 0.20–1.20)
Total Protein: 7 g/dL (ref 6.4–8.3)

## 2016-06-19 NOTE — Telephone Encounter (Signed)
Appointments scheduled per 1/18 LOS. Patient given AVS report and calendars with future scheduled appointments. °

## 2016-06-20 ENCOUNTER — Other Ambulatory Visit: Payer: Self-pay | Admitting: Nurse Practitioner

## 2016-07-16 ENCOUNTER — Other Ambulatory Visit: Payer: Self-pay

## 2016-07-30 ENCOUNTER — Ambulatory Visit
Admission: RE | Admit: 2016-07-30 | Discharge: 2016-07-30 | Disposition: A | Discharge status: Home / Self Care | Payer: Medicare Other | Source: Ambulatory Visit | Attending: Surgery | Admitting: Surgery

## 2016-07-30 DIAGNOSIS — E042 Nontoxic multinodular goiter: Secondary | ICD-10-CM

## 2016-07-30 DIAGNOSIS — E041 Nontoxic single thyroid nodule: Secondary | ICD-10-CM | POA: Diagnosis not present

## 2016-09-03 ENCOUNTER — Telehealth: Payer: Self-pay | Admitting: Hematology

## 2016-09-03 ENCOUNTER — Other Ambulatory Visit: Payer: Self-pay | Admitting: Hematology

## 2016-09-03 ENCOUNTER — Telehealth: Payer: Self-pay | Admitting: *Deleted

## 2016-09-03 MED ORDER — ANASTROZOLE 1 MG PO TABS: 1.0000 mg | ORAL_TABLET | Freq: Every day | ORAL | 3 refills | Status: DC

## 2016-09-03 NOTE — Telephone Encounter (Signed)
I called patient back regarding her high detectable for exemestane ($425), patient would like to switch exemestane to anastrozole which is much less expensive. She did take letrozole in the past and developed arthralgia. I anticipate that she may have similar reaction to anastrozole. However she would like to try, I called in anastrozole 1 mg daily to [facility] today and she will start next week when she finish exemestane supply at home.    Truitt Merle  09/03/2016

## 2016-09-03 NOTE — Telephone Encounter (Signed)
Patient called concerned about her exemestane prescription.   She usually pays $25-$35 for a 3 month supply.  This time when she went to [place] it was $425 for a 3 month supply and she cannot afford this.    Discussed this with Johny Drilling Pharmacist and she is willing to look into this, however, it might take her several days.   Called patient back and left message letting her know that our pharmacist would look into this, however it would be a few days before she could get back with her.  Asked her to check with pharmacy and see why such a change - new insurance, first rx of the year???

## 2016-09-04 MED FILL — ANASTROZOLE 1 MG TABLET: 1 | 30 days supply | Qty: 30 | Fill #0

## 2016-09-24 DIAGNOSIS — E042 Nontoxic multinodular goiter: Secondary | ICD-10-CM | POA: Diagnosis not present

## 2016-09-26 ENCOUNTER — Other Ambulatory Visit: Payer: Self-pay | Admitting: Surgery

## 2016-09-26 DIAGNOSIS — E042 Nontoxic multinodular goiter: Secondary | ICD-10-CM

## 2016-09-30 MED FILL — ANASTROZOLE 1 MG TABLET: 1 | 30 days supply | Qty: 30 | Fill #1

## 2016-10-15 ENCOUNTER — Ambulatory Visit
Admission: RE | Admit: 2016-10-15 | Discharge: 2016-10-15 | Disposition: A | Discharge status: Home / Self Care | Payer: Medicare Other | Source: Ambulatory Visit | Attending: Surgery | Admitting: Surgery

## 2016-10-15 ENCOUNTER — Other Ambulatory Visit (HOSPITAL_COMMUNITY)
Admission: RE | Admit: 2016-10-15 | Discharge: 2016-10-15 | Disposition: A | Discharge status: Home / Self Care | Payer: Medicare Other | Source: Ambulatory Visit | Attending: Radiology | Admitting: Radiology

## 2016-10-15 DIAGNOSIS — E042 Nontoxic multinodular goiter: Secondary | ICD-10-CM

## 2016-10-15 DIAGNOSIS — E041 Nontoxic single thyroid nodule: Secondary | ICD-10-CM | POA: Insufficient documentation

## 2016-10-29 ENCOUNTER — Other Ambulatory Visit: Payer: Self-pay | Admitting: Internal Medicine

## 2016-10-29 DIAGNOSIS — R1030 Lower abdominal pain, unspecified: Secondary | ICD-10-CM

## 2016-10-29 DIAGNOSIS — Z8719 Personal history of other diseases of the digestive system: Secondary | ICD-10-CM

## 2016-10-29 DIAGNOSIS — R109 Unspecified abdominal pain: Secondary | ICD-10-CM | POA: Diagnosis not present

## 2016-10-29 DIAGNOSIS — K5790 Diverticulosis of intestine, part unspecified, without perforation or abscess without bleeding: Secondary | ICD-10-CM | POA: Diagnosis not present

## 2016-10-29 DIAGNOSIS — Z853 Personal history of malignant neoplasm of breast: Secondary | ICD-10-CM

## 2016-10-31 MED FILL — ANASTROZOLE 1 MG TABLET: 1 | 30 days supply | Qty: 30 | Fill #2

## 2016-11-05 ENCOUNTER — Ambulatory Visit
Admission: RE | Admit: 2016-11-05 | Discharge: 2016-11-05 | Disposition: A | Discharge status: Home / Self Care | Payer: Medicare Other | Source: Ambulatory Visit | Attending: Internal Medicine | Admitting: Internal Medicine

## 2016-11-05 DIAGNOSIS — K5792 Diverticulitis of intestine, part unspecified, without perforation or abscess without bleeding: Secondary | ICD-10-CM | POA: Diagnosis not present

## 2016-11-05 DIAGNOSIS — R1030 Lower abdominal pain, unspecified: Secondary | ICD-10-CM

## 2016-11-05 DIAGNOSIS — K5732 Diverticulitis of large intestine without perforation or abscess without bleeding: Secondary | ICD-10-CM | POA: Diagnosis not present

## 2016-11-05 DIAGNOSIS — Z853 Personal history of malignant neoplasm of breast: Secondary | ICD-10-CM

## 2016-11-05 DIAGNOSIS — Z8719 Personal history of other diseases of the digestive system: Secondary | ICD-10-CM

## 2016-11-05 MED ORDER — IOPAMIDOL (ISOVUE-300) INJECTION 61%
100.0000 mL | Freq: Once | INTRAVENOUS | Status: AC | PRN
  Administered 2016-11-05: 100 mL via INTRAVENOUS

## 2016-11-29 MED FILL — ANASTROZOLE 1 MG TABLET: 1 | 30 days supply | Qty: 30 | Fill #3

## 2016-12-12 NOTE — Progress Notes (Signed)
[place] Office follow-up note  ID: [name] OB: 1946/02/19  MR#: [id number]  DGU#:[id number]  PCP: Wenda Low, MD GYN:   SU: Dr. Rolm Bookbinder OTHER MD: Dr. Isidore Moos, radiation oncology  CHIEF COMPLAINT:  71 y/o [name], [name] with T1 N1, stage IIA invasive ductal carcinoma of the left breast, grade II, ER 100%, PR 100%, Ki-67 of 55%, HER-2/neu AMPLIFIED.   BREAST CANCER HISTORY:  Patient palpated a left breast mass and underwent a mammogram in 05/2012 that demonstrated the mass.  It was biopsied on 05/14/12 and the pathology revealed triple positive, grade II, uinvasive ductal carcinoma with a Ki-67 of 55%  PREVIOUS THERAPY: 1. Patient underwent a left breast lumpectomy by Dr. Donne Hazel on 06/10/2012.  A grade III, 1.2 cm invasive ductal carcinoma was removed with lymphovascular invasion, DCIS with calcifications, and comedo necrosis, , in situ carcinoma was present at the anterior margin, one lymph node was positive for metastatic mammary carcinoma.    2. Patient received adjuvant chemotherapy consisting of Docetaxel, Carboplatin, Trastuzumab x 6 cycles from 07/14/2012 through 10/27/2012.  She then completed one year of adjuvant every 3 week Trastuzumab from 11/03/2012 through 07/13/2013.    3.  Patient underwent radiation therapy from 11/29/2012 through 01/17/2013.  4.  Following radiation therapy, the patient was started on adjuvant Letrozole, 2.23m PO daily.  A total of 5 years of therapy are planned.  CURRENT THERAPY: Letrozole 2.5 mg daily started in 01/2013, changed to exemestane 27mdaily from 05/17/2015  INTERVAL HISTORY:  [name] a [age].o. female with triple positive invasive ductal carcinoma of the left breast.  She presents to the clinic today saying she was diagnosed with Diverticulitis. She had extreme pain and had a CT that revealed it on her sigmoid colon. She denies bleeding but she had a lot of diarrhea. She is eating chicken and rice to  help.   REVIEW OF SYSTEMS:   Constitutional: Denies fevers, chills or abnormal night sweats Eyes: Denies blurriness of vision, double vision or watery eyes Ears, nose, mouth, throat, and face: Denies mucositis or sore throat Respiratory: Denies cough, dyspnea or wheezes Cardiovascular: Denies palpitation, chest discomfort or lower extremity swelling Gastrointestinal:  Denies nausea, heartburn (+) diarrhea (+) Diverticulitis  Skin: Denies abnormal skin rashes Lymphatics: Denies new lymphadenopathy or easy bruising Neurological:Denies numbness, tingling or new weaknesses Behavioral/Psych: Mood is stable, no new changes  All other systems were reviewed with the patient and are negative. A 10 point review of systems was conducted and is otherwise negative except for what is noted above.     PAST MEDICAL HISTORY: Past Medical History:  Diagnosis Date   Arthritis    Depression    Diverticulosis    GERD (gastroesophageal reflux disease)    Heart murmur    heard one when she was pregnamt-maybe had echo-cannot remember   Hyperlipidemia    Hyperplastic colon polyp    Hypertension    [facility] [id number]   Left   Iron deficiency anemia    S/P radiation therapy  6-30 to 01-12-13                                                       6-30 to 01-12-13                                                         ,  Left Breast boost / 10 Gy in 5 fractions                     Spinal stenosis    Status post chemotherapy    chemotherapy consisting of [place] on 07/14/12 with 6 cycles planned. Every 3 week Herceptin starting 11/03/12.    PAST SURGICAL HISTORY: Past Surgical History:  Procedure Laterality Date   ABDOMINAL ADHESION SURGERY  1980   APPENDECTOMY     Benign Breast Biopsy  1994   Left   BLADDER SUSPENSION  10/2010   BREAST LUMPECTOMY WITH NEEDLE LOCALIZATION AND AXILLARY SENTINEL LYMPH NODE BX  06/10/2012   Procedure: BREAST LUMPECTOMY WITH NEEDLE  LOCALIZATION AND AXILLARY SENTINEL LYMPH NODE BX;  Surgeon: Rolm Bookbinder, MD;  Location: [facility];  Service: General;  Laterality: Left;   CHOLECYSTECTOMY     PORTACATH PLACEMENT  07/07/2012   Procedure: INSERTION PORT-A-CATH;  Surgeon: Rolm Bookbinder, MD;  Location: [facility];  Service: General;  Laterality: Left;  Port a cath insertion   TONSILLECTOMY AND ADENOIDECTOMY     age 49   TUBAL LIGATION  1988    FAMILY HISTORY Family History  Problem Relation Age of Onset   Colon cancer Mother 82   Breast cancer Sister 62   Prostate cancer Father 42   Stomach cancer Maternal Grandmother 33    GYNECOLOGIC HISTORY:  Menarche at age 26, G48 P2, patient took oral HRT x 2 years in the 68s.  Patient has not had any abnormal pap smears, she has no h/o sexually transmitted infections.   SOCIAL HISTORY:  Lives with boyfriend [name] in a town home in [place], [name].  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      ADVANCED DIRECTIVES:   In place.  Health care power of attorney is [name] her daughter, We do not have copies, and the patient will bring these in for [place] to scan.  Dr. Glenna Durand office does have copies.     HEALTH MAINTENANCE: Social History  Substance Use Topics   Smoking status: Never Smoker   Smokeless tobacco: Never Used   Alcohol use 0.6 oz/week    1 Glasses of wine per week     Comment: 1 glass of Wine per Week    Mammogram: 12/05/2015 Colonoscopy: 2013, 5 year f/u recommended Bone Density Scan: 12/2015, osteopenia Pap Smear:  Unsure when last one was Eye Exam: 2 years ago  Vitamin D Level:  Next appt Lipid Panel: 2013   No Known Allergies  Current Outpatient Prescriptions  Medication Sig Dispense Refill   Ascorbic Acid (VITAMIN C) 1000 MG tablet Take 1,000 mg by mouth daily.       aspirin 81 MG tablet Take 81 mg by mouth daily.     atenolol  (TENORMIN) 50 MG tablet Take 50 mg by mouth daily.     Calcium Carbonate (CALCIUM 600 PO) Take 2 capsules by mouth daily.     Cholecalciferol (VITAMIN D3) 10000 units TABS Take 1 tablet by mouth daily.     exemestane (AROMASIN) 25 MG tablet Take 25 mg by mouth daily after breakfast.     ferrous sulfate 325 (65 FE) MG tablet Take 325 mg by mouth daily with breakfast.     omeprazole (PRILOSEC) 40 MG capsule Take 40 mg by mouth daily.     simvastatin (ZOCOR) 80 MG tablet Take 40 mg by mouth  at bedtime.      vitamin E 400 UNIT capsule Take 400 Units by mouth daily.       Current Facility-Administered Medications  Medication Dose Route Frequency Provider Last Rate Last Dose   0.9 %  sodium chloride infusion  500 mL Intravenous Continuous Armbruster, Renelda Loma, MD        OBJECTIVE: Vitals:   12/17/16 1117  BP: (!) 153/60  Pulse: (!) 58  Resp: 18  Temp: 98.3 F (36.8 C)     Body mass index is 30 kg/m.      GENERAL: Patient is a well appearing female in no acute distress HEENT:  Sclerae anicteric.  Oropharynx clear and moist. No ulcerations or evidence of oropharyngeal candidiasis. Neck is supple.  NODES:  No cervical, supraclavicular, or axillary lymphadenopathy palpated.  BREAST EXAM:  Right breast, no masses, nodules, skin or nipple changes, left breast s/p lumpectomy, Radiation thickness noted, no nodularity, or sign of recurrence. Benign bilateral breast exam.   LUNGS:  Clear to auscultation bilaterally.  No wheezes or rhonchi. HEART:  Regular rate and rhythm. No murmur appreciated. ABDOMEN:  Soft, nontender.  Positive, normoactive bowel sounds. No organomegaly palpated. MSK:  No focal spinal tenderness to palpation. Full range of motion bilaterally in the upper extremities. EXTREMITIES:  No peripheral edema.   SKIN:  Clear with no obvious rashes or skin changes. No nail dyscrasia. (+) various mole across her chest, normal appearance  NEURO:  Nonfocal. Well oriented.   Appropriate affect.  ECOG FS:1 - Symptomatic but completely ambulatory  LAB RESULTS: CBC Latest Ref Rng & Units 12/17/2016 06/19/2016 12/19/2015  WBC 3.9 - 10.3 10e3/uL 7.4 7.0 5.9  Hemoglobin 11.6 - 15.9 g/dL 12.8 14.0 13.9  Hematocrit 34.8 - 46.6 % 38.8 41.9 41.1  Platelets 145 - 400 10e3/uL 187 181 215.0    CMP Latest Ref Rng & Units 12/17/2016 06/19/2016 12/19/2015  Glucose 70 - 140 mg/dl 96 137 81  BUN 7.0 - 26.0 mg/dL 19.1 27.9(H) 24(H)  Creatinine 0.6 - 1.1 mg/dL 1.0 1.1 0.95  Sodium 136 - 145 mEq/L 142 142 141  Potassium 3.5 - 5.1 mEq/L 4.6 3.9 4.6  Chloride 96 - 112 mEq/L - - 104  CO2 22 - 29 mEq/L _0 Calcium 8.4 - 10.4 mg/dL 10.0 10.2 10.5  Total Protein 6.4 - 8.3 g/dL 7.0 7.0 -  Total Bilirubin 0.20 - 1.20 mg/dL 0.81 0.82 -  Alkaline Phos 40 - 150 U/L 74 75 -  AST 5 - 34 U/L 29 23 -  ALT 0 - 55 U/L 33 23 -     STUDIES: Bilateral diagnostic mammogram 12/05/2015 IMPRESSION: The left breast lumpectomy site is stable. No mammographic evidence of malignancy in the bilateral breasts.  RECOMMENDATION: Diagnostic mammogram is suggested in 1 year. (Code:[id number]-B-01Y).  Bone density scan 12/01/2015 The BMD measured at Femur Neck Left is 0.749 g/cm2 with a T-score of -2.1. This patient is considered osteopenic according to [facility]) criteria. L-2, L-3 were excluded due to degenerative changes. Spine was not compared to prior study due to exclusion of vertebral bodies on current exam. There has been a statistically significant decrease in BMD of Left hip since prior exam dated 11/14/2013.  FRAX* 10-year Probability of Fracture Based on femoral neck BMD: DualFemur (Left)  Major Osteoporotic Fracture: 12.3% Hip Fracture:                2.7% Population:  [place] (Caucasian)   ASSESSMENT: 71 y.o. [place], [name] with   1. pT1cN1M0, stage IIA invasive ductal carcinoma of the left breast, grade II, ER 100%, PR 100%, Ki-67 of  55%, HER-2/neu positive.  -She is clinically doing very well, her last mammogram and today's physical exam revealed no evidence of recurrence. -She has been tolerating exemestane very well, we'll continue for at least a total of 5 years. Given her stage II disease high risk, if she tolerates well, we may consider extend her adjuvant endocrine therapy to 7 years.  -She'll continue annual mammogram -I encouraged her to continue healthy diet and regular exercise.  -We discussed keeping her on exemestane up to 7 years, she is tolerating very well. -Labs reviewed, Exam done and all normal. Clinically she is doing well. No evidence of recurrence.    2. Osteopenia -Her last bone density scan was in July 2017, the T score of her left hip has dropped from -1.6 to -2.1 -She does not meet criteria for biphosphonate -She'll continue taking calcium and vitamin D, I'll check her vitamin D level on the next visit -We discussed that letrozole and exemestane may make her osteopenia worse. If her bone density continue getting worse, I may consider switching her exemestane to tamoxifen. compared to her 2015 bone scan her density has worsened .I suggest to take continue calcium, vitamin D while on exemestane. I also encourage low weight bearing exercise  -Her next Bone density scan will be in 2019  3. Back pain -Mild and tolerable -Continue follow-up with her orthopedic surgeon  4. Thyroid nodule -Her biopsy in January 2017 was negative for malignant cells. -She'll follow-up with her primary care physician  5. Diverticulitis  -She was diagnosed 11/05/16 with CT AP by Dr. Lysle Rubens -She will continue to f/u with him  Plan -Continue exemestane -return in 6 months for labs and evaluation, will include VitD level on next lab  -mammogram in 12/2016.     She knows to call us in the interim for any questions or concerns.  We can certainly see her sooner if needed.  I spent 25 minutes counseling the patient face to  face.  The total time spent in the appointment was 30 minutes.  This document serves as a record of services personally performed by Truitt Merle, MD. It was created on her behalf by Joslyn Devon, a trained medical scribe. The creation of this record is based on the scribe's personal observations and the provider's statements to them. This document has been checked and approved by the attending provider.   Truitt Merle   12/17/2016

## 2016-12-17 ENCOUNTER — Ambulatory Visit (HOSPITAL_BASED_OUTPATIENT_CLINIC_OR_DEPARTMENT_OTHER): Payer: Medicare Other | Admitting: Hematology

## 2016-12-17 ENCOUNTER — Encounter: Payer: Self-pay | Admitting: Hematology

## 2016-12-17 ENCOUNTER — Other Ambulatory Visit (HOSPITAL_BASED_OUTPATIENT_CLINIC_OR_DEPARTMENT_OTHER): Payer: Medicare Other

## 2016-12-17 VITALS — BP 153/60 | HR 58 | Temp 98.3°F | Resp 18 | Ht 60.0 in | Wt 153.6 lb

## 2016-12-17 DIAGNOSIS — Z79811 Long term (current) use of aromatase inhibitors: Secondary | ICD-10-CM

## 2016-12-17 DIAGNOSIS — C50412 Malignant neoplasm of upper-outer quadrant of left female breast: Secondary | ICD-10-CM | POA: Diagnosis not present

## 2016-12-17 DIAGNOSIS — M1990 Unspecified osteoarthritis, unspecified site: Secondary | ICD-10-CM

## 2016-12-17 DIAGNOSIS — Z923 Personal history of irradiation: Secondary | ICD-10-CM | POA: Diagnosis not present

## 2016-12-17 DIAGNOSIS — E041 Nontoxic single thyroid nodule: Secondary | ICD-10-CM | POA: Diagnosis not present

## 2016-12-17 DIAGNOSIS — Z9221 Personal history of antineoplastic chemotherapy: Secondary | ICD-10-CM

## 2016-12-17 DIAGNOSIS — Z170 Estrogen receptor positive status [ER+]: Secondary | ICD-10-CM | POA: Diagnosis not present

## 2016-12-17 DIAGNOSIS — M8580 Other specified disorders of bone density and structure, unspecified site: Secondary | ICD-10-CM

## 2016-12-17 DIAGNOSIS — K5792 Diverticulitis of intestine, part unspecified, without perforation or abscess without bleeding: Secondary | ICD-10-CM

## 2016-12-17 DIAGNOSIS — M549 Dorsalgia, unspecified: Secondary | ICD-10-CM | POA: Diagnosis not present

## 2016-12-17 LAB — COMPREHENSIVE METABOLIC PANEL
ALBUMIN: 3.8 g/dL (ref 3.5–5.0)
ALK PHOS: 74 U/L (ref 40–150)
ALT: 33 U/L (ref 0–55)
AST: 29 U/L (ref 5–34)
Anion Gap: 10 mEq/L (ref 3–11)
BUN: 19.1 mg/dL (ref 7.0–26.0)
CALCIUM: 10 mg/dL (ref 8.4–10.4)
CO2: 27 mEq/L (ref 22–29)
CREATININE: 1 mg/dL (ref 0.6–1.1)
Chloride: 106 mEq/L (ref 98–109)
EGFR: 53 mL/min/{1.73_m2} — ABNORMAL LOW (ref >90)
GLUCOSE: 96 mg/dL (ref 70–140)
Potassium: 4.6 mEq/L (ref 3.5–5.1)
SODIUM: 142 meq/L (ref 136–145)
TOTAL PROTEIN: 7 g/dL (ref 6.4–8.3)
Total Bilirubin: 0.81 mg/dL (ref 0.20–1.20)

## 2016-12-17 LAB — CBC WITH DIFFERENTIAL/PLATELET
BASO%: 0.3 % (ref 0.0–2.0)
BASOS ABS: 0 10*3/uL (ref 0.0–0.1)
EOS%: 0.9 % (ref 0.0–7.0)
Eosinophils Absolute: 0.1 10*3/uL (ref 0.0–0.5)
HEMATOCRIT: 38.8 % (ref 34.8–46.6)
HEMOGLOBIN: 12.8 g/dL (ref 11.6–15.9)
LYMPH#: 1.8 10*3/uL (ref 0.9–3.3)
LYMPH%: 24.2 % (ref 14.0–49.7)
MCH: 30.5 pg (ref 25.1–34.0)
MCHC: 33 g/dL (ref 31.5–36.0)
MCV: 92.4 fL (ref 79.5–101.0)
MONO#: 0.5 10*3/uL (ref 0.1–0.9)
MONO%: 7 % (ref 0.0–14.0)
NEUT%: 67.6 % (ref 38.4–76.8)
NEUTROS ABS: 5 10*3/uL (ref 1.5–6.5)
Platelets: 187 10*3/uL (ref 145–400)
RBC: 4.2 10*6/uL (ref 3.70–5.45)
RDW: 13.6 % (ref 11.2–14.5)
WBC: 7.4 10*3/uL (ref 3.9–10.3)

## 2017-01-05 ENCOUNTER — Other Ambulatory Visit: Payer: Self-pay | Admitting: Hematology

## 2017-01-06 ENCOUNTER — Other Ambulatory Visit: Payer: Self-pay | Admitting: Hematology

## 2017-01-08 ENCOUNTER — Telehealth: Payer: Self-pay | Admitting: *Deleted

## 2017-01-08 MED FILL — ANASTROZOLE 1 MG TABLET: 1 | 30 days supply | Qty: 30 | Fill #0

## 2017-01-08 NOTE — Telephone Encounter (Signed)
Pt called requesting refill of Anastrozole.   Spoke with pt, and was informed that pt has been taking Anastrozole since the cost for Aromasin is very high.   Refill escribed to [facility].

## 2017-01-14 ENCOUNTER — Telehealth: Payer: Self-pay | Admitting: Gastroenterology

## 2017-01-14 ENCOUNTER — Ambulatory Visit
Admission: RE | Admit: 2017-01-14 | Discharge: 2017-01-14 | Disposition: A | Discharge status: Home / Self Care | Payer: Medicare Other | Source: Ambulatory Visit | Attending: Hematology | Admitting: Hematology

## 2017-01-14 ENCOUNTER — Ambulatory Visit: Payer: Medicare Other | Admitting: Gastroenterology

## 2017-01-14 DIAGNOSIS — C50412 Malignant neoplasm of upper-outer quadrant of left female breast: Secondary | ICD-10-CM

## 2017-01-14 DIAGNOSIS — R922 Inconclusive mammogram: Secondary | ICD-10-CM | POA: Diagnosis not present

## 2017-01-14 DIAGNOSIS — Z170 Estrogen receptor positive status [ER+]: Principal | ICD-10-CM

## 2017-01-31 MED FILL — ANASTROZOLE 1 MG TABLET: 1 | 30 days supply | Qty: 30 | Fill #1

## 2017-03-03 MED FILL — ANASTROZOLE 1 MG TABLET: 1 | 30 days supply | Qty: 30 | Fill #2

## 2017-03-18 ENCOUNTER — Ambulatory Visit: Payer: Medicare Other | Admitting: Gastroenterology

## 2017-04-02 MED FILL — ANASTROZOLE 1 MG TABLET: 1 | 30 days supply | Qty: 30 | Fill #3

## 2017-05-05 ENCOUNTER — Other Ambulatory Visit: Payer: Self-pay | Admitting: Hematology

## 2017-05-05 MED FILL — ANASTROZOLE 1 MG TABLET: 1 | 30 days supply | Qty: 30 | Fill #0

## 2017-06-07 MED FILL — ANASTROZOLE 1 MG TABLET: 1 | 30 days supply | Qty: 30 | Fill #1

## 2017-06-17 ENCOUNTER — Other Ambulatory Visit: Payer: Medicare Other

## 2017-06-17 ENCOUNTER — Ambulatory Visit: Payer: Medicare Other | Admitting: Hematology

## 2017-06-24 ENCOUNTER — Other Ambulatory Visit: Payer: Medicare Other

## 2017-06-24 ENCOUNTER — Ambulatory Visit: Payer: Medicare Other | Admitting: Hematology

## 2017-07-01 DIAGNOSIS — Z1389 Encounter for screening for other disorder: Secondary | ICD-10-CM | POA: Diagnosis not present

## 2017-07-01 DIAGNOSIS — R7309 Other abnormal glucose: Secondary | ICD-10-CM | POA: Diagnosis not present

## 2017-07-01 DIAGNOSIS — M5136 Other intervertebral disc degeneration, lumbar region: Secondary | ICD-10-CM | POA: Diagnosis not present

## 2017-07-01 DIAGNOSIS — K219 Gastro-esophageal reflux disease without esophagitis: Secondary | ICD-10-CM | POA: Diagnosis not present

## 2017-07-01 DIAGNOSIS — C50919 Malignant neoplasm of unspecified site of unspecified female breast: Secondary | ICD-10-CM | POA: Diagnosis not present

## 2017-07-01 DIAGNOSIS — M8580 Other specified disorders of bone density and structure, unspecified site: Secondary | ICD-10-CM | POA: Diagnosis not present

## 2017-07-01 DIAGNOSIS — I10 Essential (primary) hypertension: Secondary | ICD-10-CM | POA: Diagnosis not present

## 2017-07-01 DIAGNOSIS — Z6831 Body mass index (BMI) 31.0-31.9, adult: Secondary | ICD-10-CM | POA: Diagnosis not present

## 2017-07-01 DIAGNOSIS — Z0000 Encounter for general adult medical examination without abnormal findings: Secondary | ICD-10-CM | POA: Diagnosis not present

## 2017-07-01 DIAGNOSIS — E7800 Pure hypercholesterolemia, unspecified: Secondary | ICD-10-CM | POA: Diagnosis not present

## 2017-07-01 DIAGNOSIS — N183 Chronic kidney disease, stage 3 (moderate): Secondary | ICD-10-CM | POA: Diagnosis not present

## 2017-07-11 MED FILL — ANASTROZOLE 1 MG TABLET: 1 | 30 days supply | Qty: 30 | Fill #2

## 2017-07-20 NOTE — Progress Notes (Signed)
[place] Office follow-up note  ID: [name] OB: [birth year]  MR#: [id number]  NVB#:[id number]  PCP: Kelli Low, MD GYN:   SU: Dr. Rolm Thomas OTHER MD: Dr. Isidore Thomas, radiation oncology  CHIEF COMPLAINT:  72 y/o [name], [name] with T1 N1, stage IIA invasive ductal carcinoma of the left breast, grade II, ER 100%, PR 100%, Ki-67 of 55%, HER-2/neu AMPLIFIED.   BREAST CANCER HISTORY:  Patient palpated a left breast mass and underwent a mammogram in 05/2012 that demonstrated the mass.  It was biopsied on 05/14/12 and the pathology revealed triple positive, grade II, uinvasive ductal carcinoma with a Ki-67 of 55%  PREVIOUS THERAPY: 1. Patient underwent a left breast lumpectomy by Dr. Donne Thomas on 06/10/2012.  A grade III, 1.2 cm invasive ductal carcinoma was removed with lymphovascular invasion, DCIS with calcifications, and comedo necrosis, , in situ carcinoma was present at the anterior margin, one lymph node was positive for metastatic mammary carcinoma.    2. Patient received adjuvant chemotherapy consisting of Docetaxel, Carboplatin, Trastuzumab x 6 cycles from 07/14/2012 through 10/27/2012.  She then completed one year of adjuvant every 3 week Trastuzumab from 11/03/2012 through 07/13/2013.    3.  Patient underwent radiation therapy from 11/29/2012 through 01/17/2013.  4.  Following radiation therapy, the patient was started on adjuvant Letrozole, 2.77m PO daily.  A total of 5 years of therapy are planned.  CURRENT THERAPY: Letrozole 2.5 mg daily started in 01/2013, changed to exemestane 239mdaily from 05/17/2015. Switched to Anastrozole on 09/03/16  INTERVAL HISTORY: [name] a [age]ear old female with triple positive invasive ductal carcinoma of the left breast.  She is here today for follow up. She is doing very well overall. She is compliant with Anastrozole and reports no complaints. She has her regular arthritis and lower back pain but it is not as bad  as when she was taking Letrozole. She does not see her surgeon anymore but she continues to follow up regularly with her PCP. She continues to take her Vitamin D supplement.   On review of systems, pt denies joint pain caused by medication, or any other complaints at this time. Pertinent positives are listed and detailed within the above HPI.   REVIEW OF SYSTEMS:   Constitutional: Denies fevers, chills or abnormal night sweats Eyes: Denies blurriness of vision, double vision or watery eyes Ears, nose, mouth, throat, and face: Denies mucositis or sore throat Respiratory: Denies cough, dyspnea or wheezes Cardiovascular: Denies palpitation, chest discomfort or lower extremity swelling Gastrointestinal:  Denies nausea, heartburn or change in bowel habits Skin: Denies abnormal skin rashes Lymphatics: Denies new lymphadenopathy or easy bruising Neurological:Denies numbness, tingling or new weaknesses Behavioral/Psych: Mood is stable, no new changes  MSK: (+) joint pain, much improved All other systems were reviewed with the patient and are negative. A 10 point review of systems was conducted and is otherwise negative except for what is noted above.     PAST MEDICAL HISTORY: Past Medical History:  Diagnosis Date  . Arthritis   . Depression   . Diverticulosis   . GERD (gastroesophageal reflux disease)   . Heart murmur    heard one when she was pregnamt-maybe had echo-cannot remember  . Hyperlipidemia   . Hyperplastic colon polyp   . Hypertension   . In[place] [id number]   Left  . Iron deficiency anemia   . S/P radiation therapy  6-30 to 01-12-13  6-30 to 01-12-13                                                         , Left Breast boost / 10 Gy in 5 fractions                    . Spinal stenosis   . Status post chemotherapy    chemotherapy consisting of [place] on 07/14/12 with 6 cycles planned. Every 3 week  Herceptin starting 11/03/12.    PAST SURGICAL HISTORY: Past Surgical History:  Procedure Laterality Date  . ABDOMINAL ADHESION SURGERY  1980  . APPENDECTOMY    . Benign Breast Biopsy  1994   Left  . BLADDER SUSPENSION  10/2010  . BREAST LUMPECTOMY WITH NEEDLE LOCALIZATION AND AXILLARY SENTINEL LYMPH NODE BX  06/10/2012   Procedure: BREAST LUMPECTOMY WITH NEEDLE LOCALIZATION AND AXILLARY SENTINEL LYMPH NODE BX;  Surgeon: Kelli Bookbinder, MD;  Location: [facility];  Service: General;  Laterality: Left;  . CHOLECYSTECTOMY    . PORTACATH PLACEMENT  07/07/2012   Procedure: INSERTION PORT-A-CATH;  Surgeon: Kelli Bookbinder, MD;  Location: [facility];  Service: General;  Laterality: Left;  Port a cath insertion  . TONSILLECTOMY AND ADENOIDECTOMY     age 70  . TUBAL LIGATION  1988    FAMILY HISTORY Family History  Problem Relation Age of Onset  . Colon cancer Mother 72  . Breast cancer Sister 78  . Prostate cancer Father 33  . Stomach cancer Maternal Grandmother 20    GYNECOLOGIC HISTORY:  Menarche at age 64, G9 P2, patient took oral HRT x 2 years in the 19s.  Patient has not had any abnormal pap smears, she has no h/o sexually transmitted infections.   SOCIAL HISTORY:  Lives with boyfriend [name] in a town home in [place], [name].  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      ADVANCED DIRECTIVES:   In place.  Health care power of attorney is [name] her daughter, We do not have copies, and the patient will bring these in for [place] to scan.  Dr. Glenna Thomas office does have copies.     HEALTH MAINTENANCE: Social History   Tobacco Use  . Smoking status: Never Smoker  . Smokeless tobacco: Never Used  Substance Use Topics  . Alcohol use: Yes    Alcohol/week: 0.6 oz    Types: 1 Glasses of wine per week    Comment: 1 glass of Wine per Week  . Drug use: No      Mammogram:  12/05/2015 Colonoscopy: 2013, 5 year f/u recommended Bone Density Scan: 12/2015, osteopenia Pap Smear:  Unsure when last one was Eye Exam: 2 years ago  Vitamin D Level:  Next appt Lipid Panel: 2013   No Known Allergies  Current Outpatient Medications  Medication Sig Dispense Refill  . anastrozole (ARIMIDEX) 1 MG tablet TAKE 1 TABLET BY MOUTH DAILY. 30 tablet 3  . Ascorbic Acid (VITAMIN C) 1000 MG tablet Take 1,000 mg by mouth daily.      Marland Kitchen aspirin 81 MG tablet Take 81 mg by mouth daily.    Marland Kitchen atenolol (TENORMIN) 50 MG tablet Take 50 mg by mouth daily.    Marland Kitchen  Calcium Carbonate (CALCIUM 600 PO) Take 2 capsules by mouth daily.    . Cholecalciferol (VITAMIN D3) 10000 units TABS Take 1 tablet by mouth daily.    . simvastatin (ZOCOR) 80 MG tablet Take 40 mg by mouth at bedtime.     . vitamin E 400 UNIT capsule Take 400 Units by mouth daily.       Current Facility-Administered Medications  Medication Dose Route Frequency Provider Last Rate Last Dose  . 0.9 %  sodium chloride infusion  500 mL Intravenous Continuous Armbruster, Carlota Raspberry, MD        OBJECTIVE: Vitals:   07/22/17 0901  BP: (!) 151/73  Pulse: (!) 59  Resp: 17  Temp: 97.6 F (36.4 C)  SpO2: 97%     Body mass index is 31.48 kg/m.      GENERAL: Patient is a well appearing female in no acute distress HEENT:  Sclerae anicteric.  Oropharynx clear and moist. No ulcerations or evidence of oropharyngeal candidiasis. Neck is supple.  NODES:  No cervical, supraclavicular, or axillary lymphadenopathy palpated.  BREAST EXAM:  Right breast, no masses, nodules, skin or nipple changes, left breast s/p lumpectomy, Radiation thickness noted, no nodularity, or sign of recurrence. Benign bilateral breast exam.   LUNGS:  Clear to auscultation bilaterally.  No wheezes or rhonchi. HEART:  Regular rate and rhythm. No murmur appreciated. ABDOMEN:  Soft, nontender.  Positive, normoactive bowel sounds. No organomegaly palpated. MSK:  No focal spinal  tenderness to palpation. Full range of motion bilaterally in the upper extremities. EXTREMITIES:  No peripheral edema.   SKIN:  Clear with no obvious rashes or skin changes. No nail dyscrasia. NEURO:  Nonfocal. Well oriented.  Appropriate affect. ECOG FS:1 - Symptomatic but completely ambulatory  LAB RESULTS: CBC Latest Ref Rng & Units 07/22/2017 12/17/2016 06/19/2016  WBC 3.9 - 10.3 K/uL 6.4 7.4 7.0  Hemoglobin 11.6 - 15.9 g/dL 13.0 12.8 14.0  Hematocrit 34.8 - 46.6 % 39.8 38.8 41.9  Platelets 145 - 400 K/uL 184 187 181    CMP Latest Ref Rng & Units 07/22/2017 12/17/2016 06/19/2016  Glucose 70 - 140 mg/dL 88 96 137  BUN 7 - 26 mg/dL 23 19.1 27.9(H)  Creatinine 0.60 - 1.10 mg/dL 1.01 1.0 1.1  Sodium 136 - 145 mmol/L 143 142 142  Potassium 3.5 - 5.1 mmol/L 4.1 4.6 3.9  Chloride 98 - 109 mmol/L 107 - -  CO2 22 - 29 mmol/L '27 27 26  ' Calcium 8.4 - 10.4 mg/dL 10.1 10.0 10.2  Total Protein 6.4 - 8.3 g/dL 7.0 7.0 7.0  Total Bilirubin 0.2 - 1.2 mg/dL 0.6 0.81 0.82  Alkaline Phos 40 - 150 U/L 80 74 75  AST 5 - 34 U/L '28 29 23  ' ALT 0 - 55 U/L 26 33 23     RADIOGRAPHIC STUDIES:  Bilateral diagnostic mammogram 01/14/2017 IMPRESSION: No evidence of malignancy.  Bilateral diagnostic mammogram 12/05/2015 IMPRESSION: The left breast lumpectomy site is stable. No mammographic evidence of malignancy in the bilateral breasts.   Bone density scan 12/01/2015 The BMD measured at Femur Neck Left is 0.749 g/cm2 with a T-score of -2.1.    ASSESSMENT: 72 y.o. [place], [name] with   1. pT1cN1M0, stage IIA invasive ductal carcinoma of the left breast, grade II, ER 100%, PR 100%, Ki-67 of 55%, HER-2/neu positive.  -She has been tolerating exemestane very well, we'll continue for at least a total of 5 years. Given her stage II disease high risk, if she  tolerates well, we may consider extend her adjuvant endocrine therapy to 7 years.  -She'll continue annual mammogram -I encouraged her to continue  healthy diet and regular exercise.  -Due to her high co pay for Exemestane ($425) I previously switched her to Anastrozole. She did take letrozole in the past and developed arthralgia.  -She is doing well on Anastrozole, joint pain from letrozole resolved. She has been taking anti-estrogen therapy for 5 years. Plan to continue Anastrozole for 2 more years for a total of 7 years.  -She is clinically doing very well, her last mammogram and today's physical exam revealed no evidence of recurrence. Lab reviewed, CBC and CMP normal. No clinical concern for recurrence -next mammogram due in 12/2017 -F/u in 6 months    2. Osteopenia -Her last bone density scan was in June 2017, the T score of her left hip has dropped from -1.6 to -2.1 -She does not meet criteria for biphosphonate -She'll continue taking calcium and vitamin D, I'll check her vitamin D level on the next visit -We discussed that letrozole and exemestane may make her osteopenia worse. If her bone density continue getting worse, I may consider switching her exemestane to tamoxifen. -DEXA to be done in Aug/2019  3. Back pain -Mild and tolerable -Continue follow-up with her orthopedic surgeon  4. Thyroid nodule -Her biopsy in January 2017 was negative for malignant cells. -She'll follow-up with her primary care physician -Her biopsy from May 2018 was negative for malignant cells as well  Plan -Continue Anastrzole, I refilled for her today -return in 6 months for labs and evaluation -mammogram and DEXA in 12/2017.    She knows to call us in the interim for any questions or concerns.  We can certainly see her sooner if needed.  I spent 15 minutes counseling the patient face to face.  The total time spent in the appointment was 20 minutes.  This document serves as a record of services personally performed by Truitt Merle, MD. It was created on her behalf by Theresia Bough, a trained medical scribe. The creation of this record is based on  the scribe's personal observations and the provider's statements to them.   I have reviewed the above documentation for accuracy and completeness, and I agree with the above.   Truitt Merle   07/22/2017 10:19 AM

## 2017-07-21 ENCOUNTER — Other Ambulatory Visit: Payer: Self-pay | Admitting: Hematology

## 2017-07-21 DIAGNOSIS — E559 Vitamin D deficiency, unspecified: Secondary | ICD-10-CM

## 2017-07-21 DIAGNOSIS — M8580 Other specified disorders of bone density and structure, unspecified site: Secondary | ICD-10-CM

## 2017-07-22 ENCOUNTER — Inpatient Hospital Stay: Payer: Medicare Other | Attending: Hematology

## 2017-07-22 ENCOUNTER — Telehealth: Payer: Self-pay | Admitting: Hematology

## 2017-07-22 ENCOUNTER — Encounter: Payer: Self-pay | Admitting: Hematology

## 2017-07-22 ENCOUNTER — Inpatient Hospital Stay (HOSPITAL_BASED_OUTPATIENT_CLINIC_OR_DEPARTMENT_OTHER): Payer: Medicare Other | Admitting: Hematology

## 2017-07-22 VITALS — BP 151/73 | HR 59 | Temp 97.6°F | Resp 17 | Ht 60.0 in | Wt 161.2 lb

## 2017-07-22 DIAGNOSIS — E2839 Other primary ovarian failure: Secondary | ICD-10-CM

## 2017-07-22 DIAGNOSIS — Z8042 Family history of malignant neoplasm of prostate: Secondary | ICD-10-CM | POA: Diagnosis not present

## 2017-07-22 DIAGNOSIS — M1990 Unspecified osteoarthritis, unspecified site: Secondary | ICD-10-CM

## 2017-07-22 DIAGNOSIS — Z7982 Long term (current) use of aspirin: Secondary | ICD-10-CM

## 2017-07-22 DIAGNOSIS — Z800 Family history of malignant neoplasm of digestive organs: Secondary | ICD-10-CM

## 2017-07-22 DIAGNOSIS — Z170 Estrogen receptor positive status [ER+]: Secondary | ICD-10-CM | POA: Diagnosis not present

## 2017-07-22 DIAGNOSIS — Z79811 Long term (current) use of aromatase inhibitors: Secondary | ICD-10-CM

## 2017-07-22 DIAGNOSIS — M545 Low back pain: Secondary | ICD-10-CM | POA: Diagnosis not present

## 2017-07-22 DIAGNOSIS — Z79899 Other long term (current) drug therapy: Secondary | ICD-10-CM | POA: Diagnosis not present

## 2017-07-22 DIAGNOSIS — M8580 Other specified disorders of bone density and structure, unspecified site: Secondary | ICD-10-CM

## 2017-07-22 DIAGNOSIS — C50412 Malignant neoplasm of upper-outer quadrant of left female breast: Secondary | ICD-10-CM

## 2017-07-22 DIAGNOSIS — C50912 Malignant neoplasm of unspecified site of left female breast: Secondary | ICD-10-CM | POA: Insufficient documentation

## 2017-07-22 DIAGNOSIS — Z803 Family history of malignant neoplasm of breast: Secondary | ICD-10-CM | POA: Insufficient documentation

## 2017-07-22 DIAGNOSIS — Z1231 Encounter for screening mammogram for malignant neoplasm of breast: Secondary | ICD-10-CM

## 2017-07-22 LAB — COMPREHENSIVE METABOLIC PANEL
ALT: 26 U/L (ref 0–55)
AST: 28 U/L (ref 5–34)
Albumin: 3.9 g/dL (ref 3.5–5.0)
Alkaline Phosphatase: 80 U/L (ref 40–150)
Anion gap: 9 (ref 3–11)
BILIRUBIN TOTAL: 0.6 mg/dL (ref 0.2–1.2)
BUN: 23 mg/dL (ref 7–26)
CHLORIDE: 107 mmol/L (ref 98–109)
CO2: 27 mmol/L (ref 22–29)
Calcium: 10.1 mg/dL (ref 8.4–10.4)
Creatinine, Ser: 1.01 mg/dL (ref 0.60–1.10)
GFR calc non Af Amer: 54 mL/min — ABNORMAL LOW (ref >60)
Glucose, Bld: 88 mg/dL (ref 70–140)
POTASSIUM: 4.1 mmol/L (ref 3.5–5.1)
SODIUM: 143 mmol/L (ref 136–145)
Total Protein: 7 g/dL (ref 6.4–8.3)

## 2017-07-22 LAB — CBC WITH DIFFERENTIAL/PLATELET
BASOS ABS: 0 10*3/uL (ref 0.0–0.1)
Basophils Relative: 1 %
Eosinophils Absolute: 0.1 10*3/uL (ref 0.0–0.5)
Eosinophils Relative: 1 %
HEMATOCRIT: 39.8 % (ref 34.8–46.6)
HEMOGLOBIN: 13 g/dL (ref 11.6–15.9)
LYMPHS ABS: 2.1 10*3/uL (ref 0.9–3.3)
LYMPHS PCT: 33 %
MCH: 30.3 pg (ref 25.1–34.0)
MCHC: 32.7 g/dL (ref 31.5–36.0)
MCV: 92.8 fL (ref 79.5–101.0)
Monocytes Absolute: 0.3 10*3/uL (ref 0.1–0.9)
Monocytes Relative: 5 %
NEUTROS ABS: 3.9 10*3/uL (ref 1.5–6.5)
NEUTROS PCT: 60 %
Platelets: 184 10*3/uL (ref 145–400)
RBC: 4.29 MIL/uL (ref 3.70–5.45)
RDW: 13.2 % (ref 11.2–14.5)
WBC: 6.4 10*3/uL (ref 3.9–10.3)

## 2017-07-22 MED ORDER — ANASTROZOLE 1 MG PO TABS: 1.0000 mg | ORAL_TABLET | Freq: Every day | ORAL | 3 refills | Status: AC

## 2017-07-22 NOTE — Telephone Encounter (Signed)
Scheduled appt per 2/20 los - Gave patient AVS and calender per los.  

## 2017-07-30 ENCOUNTER — Other Ambulatory Visit: Payer: Self-pay | Admitting: Hematology

## 2017-07-30 DIAGNOSIS — Z9889 Other specified postprocedural states: Secondary | ICD-10-CM

## 2017-07-31 DIAGNOSIS — J101 Influenza due to other identified influenza virus with other respiratory manifestations: Secondary | ICD-10-CM | POA: Diagnosis not present

## 2017-08-07 DIAGNOSIS — R05 Cough: Secondary | ICD-10-CM | POA: Diagnosis not present

## 2017-08-08 MED FILL — ANASTROZOLE 1 MG TABLET: 1 | 30 days supply | Qty: 30 | Fill #3

## 2017-09-09 MED FILL — ANASTROZOLE 1 MG TABLET: 1 | 90 days supply | Qty: 90 | Fill #0

## 2017-10-01 ENCOUNTER — Other Ambulatory Visit: Payer: Self-pay | Admitting: Surgery

## 2017-10-01 DIAGNOSIS — E042 Nontoxic multinodular goiter: Secondary | ICD-10-CM

## 2017-10-14 ENCOUNTER — Other Ambulatory Visit: Payer: Medicare Other

## 2017-10-21 ENCOUNTER — Ambulatory Visit
Admission: RE | Admit: 2017-10-21 | Discharge: 2017-10-21 | Disposition: A | Discharge status: Home / Self Care | Payer: Medicare Other | Source: Ambulatory Visit | Attending: Surgery | Admitting: Surgery

## 2017-10-21 DIAGNOSIS — E042 Nontoxic multinodular goiter: Secondary | ICD-10-CM

## 2017-11-25 DIAGNOSIS — E042 Nontoxic multinodular goiter: Secondary | ICD-10-CM | POA: Diagnosis not present

## 2017-12-09 MED FILL — ANASTROZOLE 1 MG TABLET: 1 | 90 days supply | Qty: 90 | Fill #1

## 2018-01-20 ENCOUNTER — Ambulatory Visit
Admission: RE | Admit: 2018-01-20 | Discharge: 2018-01-20 | Disposition: A | Discharge status: Home / Self Care | Payer: Medicare Other | Source: Ambulatory Visit | Attending: Hematology | Admitting: Hematology

## 2018-01-20 ENCOUNTER — Inpatient Hospital Stay: Payer: Medicare Other

## 2018-01-20 ENCOUNTER — Inpatient Hospital Stay: Payer: Medicare Other | Attending: Hematology | Admitting: Hematology

## 2018-01-20 DIAGNOSIS — Z780 Asymptomatic menopausal state: Secondary | ICD-10-CM | POA: Diagnosis not present

## 2018-01-20 DIAGNOSIS — E2839 Other primary ovarian failure: Secondary | ICD-10-CM

## 2018-01-20 DIAGNOSIS — Z9889 Other specified postprocedural states: Secondary | ICD-10-CM

## 2018-01-20 DIAGNOSIS — M8589 Other specified disorders of bone density and structure, multiple sites: Secondary | ICD-10-CM | POA: Diagnosis not present

## 2018-01-20 DIAGNOSIS — R922 Inconclusive mammogram: Secondary | ICD-10-CM | POA: Diagnosis not present

## 2018-01-25 ENCOUNTER — Telehealth: Payer: Self-pay

## 2018-01-25 NOTE — Telephone Encounter (Signed)
-----   Message from Truitt Merle, MD sent at 01/24/2018 12:34 PM EDT ----- Please let pt know her DEXA results, she has osteopenia, no significant change from 2 years ago, continue calcium and Vit D supplement, thanks   Truitt Merle  01/24/2018

## 2018-01-25 NOTE — Telephone Encounter (Signed)
Spoke with patient per Dr. Burr Medico informed her DEXA results show osteopenia, no significant changes from 2 years ago, continue Calcium and Vitamin D supplements, patient verbalized an understanding.

## 2018-03-10 MED FILL — ANASTROZOLE 1 MG TABLET: 1 | 90 days supply | Qty: 90 | Fill #2

## 2018-06-09 MED FILL — ANASTROZOLE 1 MG TABLET: 1 | 90 days supply | Qty: 90 | Fill #3

## 2018-07-14 DIAGNOSIS — Z0000 Encounter for general adult medical examination without abnormal findings: Secondary | ICD-10-CM | POA: Diagnosis not present

## 2018-07-14 DIAGNOSIS — K5790 Diverticulosis of intestine, part unspecified, without perforation or abscess without bleeding: Secondary | ICD-10-CM | POA: Diagnosis not present

## 2018-07-14 DIAGNOSIS — M5136 Other intervertebral disc degeneration, lumbar region: Secondary | ICD-10-CM | POA: Diagnosis not present

## 2018-07-14 DIAGNOSIS — N183 Chronic kidney disease, stage 3 (moderate): Secondary | ICD-10-CM | POA: Diagnosis not present

## 2018-07-14 DIAGNOSIS — R7309 Other abnormal glucose: Secondary | ICD-10-CM | POA: Diagnosis not present

## 2018-07-14 DIAGNOSIS — M8580 Other specified disorders of bone density and structure, unspecified site: Secondary | ICD-10-CM | POA: Diagnosis not present

## 2018-07-14 DIAGNOSIS — C50919 Malignant neoplasm of unspecified site of unspecified female breast: Secondary | ICD-10-CM | POA: Diagnosis not present

## 2018-07-14 DIAGNOSIS — K219 Gastro-esophageal reflux disease without esophagitis: Secondary | ICD-10-CM | POA: Diagnosis not present

## 2018-07-14 DIAGNOSIS — E7800 Pure hypercholesterolemia, unspecified: Secondary | ICD-10-CM | POA: Diagnosis not present

## 2018-07-14 DIAGNOSIS — Z1389 Encounter for screening for other disorder: Secondary | ICD-10-CM | POA: Diagnosis not present

## 2018-07-14 DIAGNOSIS — I10 Essential (primary) hypertension: Secondary | ICD-10-CM | POA: Diagnosis not present

## 2018-07-16 ENCOUNTER — Telehealth: Payer: Self-pay | Admitting: Hematology

## 2018-07-16 NOTE — Telephone Encounter (Signed)
Spoke with patient, informed there are no upcoming appts.

## 2018-07-25 DIAGNOSIS — J069 Acute upper respiratory infection, unspecified: Secondary | ICD-10-CM | POA: Diagnosis not present

## 2018-07-25 DIAGNOSIS — R509 Fever, unspecified: Secondary | ICD-10-CM | POA: Diagnosis not present

## 2018-09-23 DIAGNOSIS — F411 Generalized anxiety disorder: Secondary | ICD-10-CM | POA: Diagnosis not present

## 2018-10-15 ENCOUNTER — Other Ambulatory Visit: Payer: Self-pay | Admitting: Surgery

## 2018-10-15 DIAGNOSIS — E042 Nontoxic multinodular goiter: Secondary | ICD-10-CM

## 2018-12-09 ENCOUNTER — Other Ambulatory Visit: Payer: Self-pay | Admitting: Hematology

## 2018-12-09 DIAGNOSIS — Z1231 Encounter for screening mammogram for malignant neoplasm of breast: Secondary | ICD-10-CM

## 2019-01-26 ENCOUNTER — Ambulatory Visit: Payer: Medicare Other

## 2019-03-09 ENCOUNTER — Ambulatory Visit
Admission: RE | Admit: 2019-03-09 | Discharge: 2019-03-09 | Disposition: A | Discharge status: Home / Self Care | Payer: Medicare Other | Source: Ambulatory Visit | Attending: Hematology | Admitting: Hematology

## 2019-03-09 ENCOUNTER — Other Ambulatory Visit: Payer: Self-pay

## 2019-03-09 DIAGNOSIS — Z1231 Encounter for screening mammogram for malignant neoplasm of breast: Secondary | ICD-10-CM | POA: Diagnosis not present

## 2019-04-25 DIAGNOSIS — R05 Cough: Secondary | ICD-10-CM | POA: Diagnosis not present

## 2019-06-27 DIAGNOSIS — Z23 Encounter for immunization: Secondary | ICD-10-CM | POA: Diagnosis not present

## 2019-07-20 DIAGNOSIS — I10 Essential (primary) hypertension: Secondary | ICD-10-CM | POA: Diagnosis not present

## 2019-07-20 DIAGNOSIS — E7800 Pure hypercholesterolemia, unspecified: Secondary | ICD-10-CM | POA: Diagnosis not present

## 2019-07-20 DIAGNOSIS — M8580 Other specified disorders of bone density and structure, unspecified site: Secondary | ICD-10-CM | POA: Diagnosis not present

## 2019-07-20 DIAGNOSIS — R7309 Other abnormal glucose: Secondary | ICD-10-CM | POA: Diagnosis not present

## 2019-07-20 DIAGNOSIS — Z1389 Encounter for screening for other disorder: Secondary | ICD-10-CM | POA: Diagnosis not present

## 2019-07-20 DIAGNOSIS — Z853 Personal history of malignant neoplasm of breast: Secondary | ICD-10-CM | POA: Diagnosis not present

## 2019-07-20 DIAGNOSIS — Z0000 Encounter for general adult medical examination without abnormal findings: Secondary | ICD-10-CM | POA: Diagnosis not present

## 2019-07-20 DIAGNOSIS — N183 Chronic kidney disease, stage 3 unspecified: Secondary | ICD-10-CM | POA: Diagnosis not present

## 2019-07-20 DIAGNOSIS — F411 Generalized anxiety disorder: Secondary | ICD-10-CM | POA: Diagnosis not present

## 2019-08-16 DIAGNOSIS — U071 COVID-19: Secondary | ICD-10-CM | POA: Diagnosis not present

## 2019-12-21 DIAGNOSIS — I10 Essential (primary) hypertension: Secondary | ICD-10-CM | POA: Diagnosis not present

## 2019-12-21 DIAGNOSIS — N183 Chronic kidney disease, stage 3 unspecified: Secondary | ICD-10-CM | POA: Diagnosis not present

## 2020-01-10 ENCOUNTER — Other Ambulatory Visit: Payer: Self-pay | Admitting: Internal Medicine

## 2020-01-10 DIAGNOSIS — Z1231 Encounter for screening mammogram for malignant neoplasm of breast: Secondary | ICD-10-CM

## 2020-03-14 ENCOUNTER — Other Ambulatory Visit: Payer: Self-pay

## 2020-03-14 ENCOUNTER — Ambulatory Visit
Admission: RE | Admit: 2020-03-14 | Discharge: 2020-03-14 | Disposition: A | Discharge status: Home / Self Care | Payer: Medicare Other | Source: Ambulatory Visit | Attending: Internal Medicine | Admitting: Internal Medicine

## 2020-03-14 DIAGNOSIS — Z1231 Encounter for screening mammogram for malignant neoplasm of breast: Secondary | ICD-10-CM

## 2020-07-11 DIAGNOSIS — R079 Chest pain, unspecified: Secondary | ICD-10-CM | POA: Diagnosis not present

## 2020-07-18 ENCOUNTER — Other Ambulatory Visit (HOSPITAL_COMMUNITY): Payer: Self-pay | Admitting: Internal Medicine

## 2020-07-20 ENCOUNTER — Telehealth: Payer: Self-pay | Admitting: *Deleted

## 2020-07-20 NOTE — Telephone Encounter (Signed)
NOTES OF FILE FROM [facility] [place], [place] [phone]. REFERRAL SENT TO SCHEDULING.

## 2020-07-24 NOTE — Telephone Encounter (Signed)
The only thing that I did for this pt was document that there were notes received and on file from [facility] and sent the referral to scheduling. I would not be involved in ordering a stress test.

## 2020-07-24 NOTE — Telephone Encounter (Signed)
Junie Panning is calling to see if the order for the patient to have a stress test has been received. She states she has sent it twice, but there is no referral or active request in the system. I advised her of this telephone note confirming it was received and she requested a callback from [place] to discuss what the status of the order is. Please advise.

## 2020-07-25 ENCOUNTER — Telehealth: Payer: Self-pay

## 2020-07-25 DIAGNOSIS — I10 Essential (primary) hypertension: Secondary | ICD-10-CM | POA: Diagnosis not present

## 2020-07-25 DIAGNOSIS — Z853 Personal history of malignant neoplasm of breast: Secondary | ICD-10-CM | POA: Diagnosis not present

## 2020-07-25 DIAGNOSIS — M8580 Other specified disorders of bone density and structure, unspecified site: Secondary | ICD-10-CM | POA: Diagnosis not present

## 2020-07-25 DIAGNOSIS — F341 Dysthymic disorder: Secondary | ICD-10-CM | POA: Diagnosis not present

## 2020-07-25 DIAGNOSIS — N1831 Chronic kidney disease, stage 3a: Secondary | ICD-10-CM | POA: Diagnosis not present

## 2020-07-25 DIAGNOSIS — M25551 Pain in right hip: Secondary | ICD-10-CM | POA: Diagnosis not present

## 2020-07-25 DIAGNOSIS — R7309 Other abnormal glucose: Secondary | ICD-10-CM | POA: Diagnosis not present

## 2020-07-25 DIAGNOSIS — F411 Generalized anxiety disorder: Secondary | ICD-10-CM | POA: Diagnosis not present

## 2020-07-25 DIAGNOSIS — Z0000 Encounter for general adult medical examination without abnormal findings: Secondary | ICD-10-CM | POA: Diagnosis not present

## 2020-07-25 DIAGNOSIS — Z1389 Encounter for screening for other disorder: Secondary | ICD-10-CM | POA: Diagnosis not present

## 2020-07-25 DIAGNOSIS — N3942 Incontinence without sensory awareness: Secondary | ICD-10-CM | POA: Diagnosis not present

## 2020-07-25 DIAGNOSIS — K219 Gastro-esophageal reflux disease without esophagitis: Secondary | ICD-10-CM | POA: Diagnosis not present

## 2020-07-25 DIAGNOSIS — E7800 Pure hypercholesterolemia, unspecified: Secondary | ICD-10-CM | POA: Diagnosis not present

## 2020-07-25 NOTE — Telephone Encounter (Signed)
REFERRAL FOR STRESS TEST PLACED IN SCHEDULING BOX

## 2020-07-27 ENCOUNTER — Telehealth: Payer: Self-pay

## 2020-07-27 NOTE — Telephone Encounter (Signed)
NOTES ON FILE FROM [facility] [place] [phone], SENT REFERRAL TO SCHEDULING 

## 2020-08-01 DIAGNOSIS — R079 Chest pain, unspecified: Secondary | ICD-10-CM | POA: Diagnosis not present

## 2020-08-03 DIAGNOSIS — M1611 Unilateral primary osteoarthritis, right hip: Secondary | ICD-10-CM | POA: Diagnosis not present

## 2020-08-10 DIAGNOSIS — R079 Chest pain, unspecified: Secondary | ICD-10-CM | POA: Diagnosis not present

## 2020-08-15 DIAGNOSIS — M25551 Pain in right hip: Secondary | ICD-10-CM | POA: Diagnosis not present

## 2020-08-22 ENCOUNTER — Other Ambulatory Visit: Payer: Self-pay

## 2020-08-22 ENCOUNTER — Ambulatory Visit (INDEPENDENT_AMBULATORY_CARE_PROVIDER_SITE_OTHER): Payer: Medicare Other | Admitting: Cardiology

## 2020-08-22 ENCOUNTER — Encounter: Payer: Self-pay | Admitting: Cardiology

## 2020-08-22 VITALS — BP 152/76 | HR 63 | Ht 60.0 in | Wt 170.0 lb

## 2020-08-22 DIAGNOSIS — R072 Precordial pain: Secondary | ICD-10-CM

## 2020-08-22 DIAGNOSIS — R079 Chest pain, unspecified: Secondary | ICD-10-CM

## 2020-08-22 DIAGNOSIS — R0600 Dyspnea, unspecified: Secondary | ICD-10-CM | POA: Diagnosis not present

## 2020-08-22 DIAGNOSIS — R002 Palpitations: Secondary | ICD-10-CM

## 2020-08-22 DIAGNOSIS — R0609 Other forms of dyspnea: Secondary | ICD-10-CM

## 2020-08-22 DIAGNOSIS — R931 Abnormal findings on diagnostic imaging of heart and coronary circulation: Secondary | ICD-10-CM | POA: Diagnosis not present

## 2020-08-22 LAB — BASIC METABOLIC PANEL
BUN/Creatinine Ratio: 26 (ref 12–28)
BUN: 29 mg/dL — ABNORMAL HIGH (ref 8–27)
CO2: 23 mmol/L (ref 20–29)
Calcium: 10.5 mg/dL — ABNORMAL HIGH (ref 8.7–10.3)
Chloride: 101 mmol/L (ref 96–106)
Creatinine, Ser: 1.12 mg/dL — ABNORMAL HIGH (ref 0.57–1.00)
Glucose: 88 mg/dL (ref 65–99)
Potassium: 4.3 mmol/L (ref 3.5–5.2)
Sodium: 141 mmol/L (ref 134–144)
eGFR: 51 mL/min/{1.73_m2} — ABNORMAL LOW (ref >59)

## 2020-08-22 MED ORDER — METOPROLOL TARTRATE 100 MG PO TABS: ORAL_TABLET | ORAL | 0 refills | Status: DC

## 2020-08-22 NOTE — Patient Instructions (Addendum)
Medication Instructions:  Your physician recommends that you continue on your current medications as directed. Please refer to the Current Medication list given to you today.  *If you need a refill on your cardiac medications before your next appointment, please call your pharmacy*  Lab Work: BMET prior to CT scan If you have labs (blood work) drawn today and your tests are completely normal, you will receive your results only by: Marland Kitchen MyChart Message (if you have MyChart) OR . A paper copy in the mail If you have any lab test that is abnormal or we need to change your treatment, we will call you to review the results.   Testing/Procedures: Your provider has recommended that you have a coronary CTA scan. Please see below for further instructions.   Follow-Up: At [facility], you and your health needs are our priority.  As part of our continuing mission to provide you with exceptional heart care, we have created designated Provider Care Teams.  These Care Teams include your primary Cardiologist (physician) and Advanced Practice Providers (APPs -  Physician Assistants and Nurse Practitioners) who all work together to provide you with the care you need, when you need it.  Follow up with Dr. Radford Pax based on results of testing.   Other Instructions: Your cardiac CT will be scheduled at one of the below locations:   [facility] [address] [place],  [address] [phone]  If scheduled at [facility], please arrive at the [facility] main entrance (entrance A) of [facility] 30 minutes prior to test start time. Proceed to the [facility] Radiology Department (first floor) to check-in and test prep.  Please follow these instructions carefully (unless otherwise directed):  On the Night Before the Test: . Be sure to Drink plenty of water. . Do not consume any caffeinated/decaffeinated beverages or chocolate 12 hours prior to your test. . Do not take  any antihistamines 12 hours prior to your test.  On the Day of the Test: . Drink plenty of water until 1 hour prior to the test. . Do not eat any food 4 hours prior to the test. . You may take your regular medications prior to the test.  . Take metoprolol (Lopressor) two hours prior to test if heart rate less than 60. Marland Kitchen HOLD /Hydrochlorothiazide morning of the test. . FEMALES- please wear underwire-free bra if available  After the Test: . Drink plenty of water. . After receiving IV contrast, you may experience a mild flushed feeling. This is normal. . On occasion, you may experience a mild rash up to 24 hours after the test. This is not dangerous. If this occurs, you can take Benadryl 25 mg and increase your fluid intake. . If you experience trouble breathing, this can be serious. If it is severe call 911 IMMEDIATELY. If it is mild, please call our office. . If you take any of these medications: Glipizide/Metformin, Avandament, Glucavance, please do not take 48 hours after completing test unless otherwise instructed.   Once we have confirmed authorization from your insurance company, we will call you to set up a date and time for your test. Based on how quickly your insurance processes prior authorizations requests, please allow up to 4 weeks to be contacted for scheduling your Cardiac CT appointment. Be advised that routine Cardiac CT appointments could be scheduled as many as 8 weeks after your provider has ordered it.  For non-scheduling related questions, please contact the cardiac imaging nurse  navigator should you have any questions/concerns: Marchia Bond, Cardiac Imaging Nurse Navigator Gordy Clement, Cardiac Imaging Nurse Navigator [facility] Direct Office Dial: [phone]   For scheduling needs, including cancellations and rescheduling, please call [place], [phone].

## 2020-08-22 NOTE — Addendum Note (Signed)
Addended by: Antonieta Iba on: 08/22/2020 10:34 AM   Modules accepted: Orders

## 2020-08-22 NOTE — Addendum Note (Signed)
Addended by: Antonieta Iba on: 08/22/2020 10:38 AM   Modules accepted: Orders

## 2020-08-22 NOTE — Progress Notes (Signed)
Cardiology Consult  Note    Date:  08/22/2020   ID:  [name], DOB [birth year], MRN [id number]  PCP:  Wenda Low, MD  Cardiologist:  Fransico Him, MD   Chief Complaint  Patient presents with  . New Patient (Initial Visit)    Chest pain, DOE, palpitations    History of Present Illness:  [name] is a 75 y.o. female who is being seen today for the evaluation of chest pain at the request of [place], [place], [place].  This is a 75yo female with a hx of breast CA s/p XRT and chemo in 2013, CKD stage 3, HLD, GERD and HTN who presents today for evaluation of chest pain.  She was recently seen by her PCP for complaints of chest pain and pressure along with SOB for the past 8 months which has progressively worsened.  She now can only walk about 39ft before she gets severely SOB with chest pain and palpitations.  Her CP is exertional and nonexertional with no radiation and no associated nausea, diaphoresis or numbness/tingling.  2D echo was done which showed normal LVF with EF 74%, G1DD, mild LAE and she is now referred for stress testing.   She tells me that when she gets the CP it feels like "an elephant sitting on my chest".  There is no radiation of the discomfort.  She had an episode last night while in bed and held a pillow up to her chest until it eased up.  It lasted 30 minutes.  She denies any belching, sour taste in her mouth or indigestion symptoms.  She denies any SOB with the chest discomfort but notices severe DOE with any walking.  She denies any LE edema, PND, orthopnea, dizziness or syncope.  SHe also has had palpitations and wore a ziopatch monitor but we do not have the results.   Past Medical History:  Diagnosis Date  . Arthritis   . Breast CA ([place])   . Chest pain   . CKD (chronic kidney disease), stage III ([place])   . Depression   . Diastolic dysfunction   . Diverticulosis   . Dyslipidemia   . GERD (gastroesophageal reflux disease)   . Heart murmur    heard  one when she was pregnamt-maybe had echo-cannot remember  . Hepatic cyst   . Hyperlipidemia   . Hyperplastic colon polyp   . Hypertension   . Incontinence of urine   . Invasive ductal carcinoma of breast ([place]) 2013   Left  . Iron deficiency anemia   . Left rib fracture   . Lumbar radiculopathy   . Obesity   . Osteopenia   . S/P radiation therapy  6-30 to 01-12-13                                                       6-30 to 01-12-13                                                         , Left Breast boost / 10 Gy in 5 fractions                    .  Spinal stenosis   . Status post chemotherapy    chemotherapy consisting of [place] on 07/14/12 with 6 cycles planned. Every 3 week Herceptin starting 11/03/12.    Past Surgical History:  Procedure Laterality Date  . ABDOMINAL ADHESION SURGERY  1980  . APPENDECTOMY    . Benign Breast Biopsy  1994   Left  . BLADDER SUSPENSION  10/2010  . BREAST LUMPECTOMY Left 06/10/2012  . BREAST LUMPECTOMY WITH NEEDLE LOCALIZATION AND AXILLARY SENTINEL LYMPH NODE BX  06/10/2012   Procedure: BREAST LUMPECTOMY WITH NEEDLE LOCALIZATION AND AXILLARY SENTINEL LYMPH NODE BX;  Surgeon: Rolm Bookbinder, MD;  Location: [facility];  Service: General;  Laterality: Left;  . CHOLECYSTECTOMY    . PORTACATH PLACEMENT  07/07/2012   Procedure: INSERTION PORT-A-CATH;  Surgeon: Rolm Bookbinder, MD;  Location: [facility];  Service: General;  Laterality: Left;  Port a cath insertion  . TONSILLECTOMY AND ADENOIDECTOMY     age 29  . TUBAL LIGATION  1988    Current Medications: Current Meds  Medication Sig  . anastrozole (ARIMIDEX) 1 MG tablet Take 1 tablet (1 mg total) by mouth daily.  . Ascorbic Acid (VITAMIN C) 1000 MG tablet Take 1,000 mg by mouth daily.  Marland Kitchen aspirin 81 MG tablet Take 81 mg by mouth daily.  Marland Kitchen atenolol (TENORMIN) 50 MG tablet Take 50 mg by mouth daily.  . Calcium Carbonate (CALCIUM 600 PO) Take 2 capsules by mouth daily.  .  Cholecalciferol (VITAMIN D3) 10000 units TABS Take 1 tablet by mouth daily.  . citalopram (CELEXA) 10 MG tablet Take 10 mg by mouth daily.  Marland Kitchen losartan-hydrochlorothiazide (HYZAAR) 50-12.5 MG tablet Take 1 tablet by mouth daily.  Marland Kitchen omeprazole (PRILOSEC) 40 MG capsule Take 40 mg by mouth daily.  . simvastatin (ZOCOR) 80 MG tablet Take 40 mg by mouth at bedtime.   . vitamin E 400 UNIT capsule Take 400 Units by mouth daily.   Current Facility-Administered Medications for the 08/22/20 encounter (Office Visit) with Sueanne Margarita, MD  Medication  . 0.9 %  sodium chloride infusion    Allergies:   Patient has no known allergies.   Social History   Socioeconomic History  . Marital status: Legally Separated    Spouse name: Not on file  . Number of children: Not on file  . Years of education: Not on file  . Highest education level: Not on file  Occupational History  . Not on file  Tobacco Use  . Smoking status: Never Smoker  . Smokeless tobacco: Never Used  Substance and Sexual Activity  . Alcohol use: Yes    Alcohol/week: 1.0 standard drink    Types: 1 Glasses of wine per week    Comment: 1 glass of Wine per Week  . Drug use: No  . Sexual activity: Not Currently    Birth control/protection: Post-menopausal    Comment: Menarche age 60, parity age 64, G24, P45, Menopause age 58, No HRT  Other Topics Concern  . Not on file  Social History Narrative  . Not on file   Social Determinants of Health   Financial Resource Strain: Not on file  Food Insecurity: Not on file  Transportation Needs: Not on file  Physical Activity: Not on file  Stress: Not on file  Social Connections: Not on file     Family History:  The patient's family history includes Breast cancer (age of onset: 16) in her sister; CAD in her father; Colon cancer (age of  onset: 33) in her mother; Prostate cancer (age of onset: 11) in her father; Stomach cancer (age of onset: 26) in her maternal grandmother.   ROS:    Please see the history of present illness.    ROS All other systems reviewed and are negative.  No flowsheet data found.  PHYSICAL EXAM:   VS:  BP (!) 152/76 (BP Location: Left Arm, Patient Position: Sitting, Cuff Size: Normal)   Pulse 63   Ht 5' (1.524 m)   Wt 170 lb (77.1 kg)   SpO2 98%   BMI 33.20 kg/m    GEN: Well nourished, well developed, in no acute distress  HEENT: normal  Neck: no JVD, carotid bruits, or masses Cardiac: RRR; no murmurs, rubs, or gallops,no edema.  Intact distal pulses bilaterally.  Respiratory:  clear to auscultation bilaterally, normal work of breathing GI: soft, nontender, nondistended, + BS MS: no deformity or atrophy  Skin: warm and dry, no rash Neuro:  Alert and Oriented x 3, Strength and sensation are intact Psych: euthymic mood, full affect  Wt Readings from Last 3 Encounters:  08/22/20 170 lb (77.1 kg)  07/22/17 161 lb 3.2 oz (73.1 kg)  12/17/16 153 lb 9.6 oz (69.7 kg)      Studies/Labs Reviewed:   EKG:  EKG is ordered today.  The ekg ordered today demonstrates NSR with no ST changes  Recent Labs: No results found for requested labs within last 8760 hours.   Lipid Panel No results found for: CHOL, TRIG, HDL, CHOLHDL, VLDL, LDLCALC, LDLDIRECT   Additional studies/ records that were reviewed today include:  OV notes from PCP, 2D echo from PCP  ASSESSMENT:    1. Chest pain, unspecified type   2. DOE (dyspnea on exertion)   3. Palpitations      PLAN:  In order of problems listed above:  1. Chest pain -she has typical and atypical components.  Her pain is both exertional and nonexertional with no associated symptoms and no radiation of the pain.  -she does have CRFs including HTN, fm hx of CAD and HLD.  She has never smoked.   -EKG shows inferior infarct but no wall motion abnormality on 2D echo which showed normal LVF -I will get a coronary CTA to define coronary anatomy  2.  DOE -this has become very limiting to the point  she can only walk 50 ft -2D echo showed normal LVF with mild G1DD -this could be an anginal equivalent so will await results of coronary CTA  3.  Palpitations -she wore a Ziopatch through her PCP and I will get a copy of that   Medication Adjustments/Labs and Tests Ordered: Current medicines are reviewed at length with the patient today.  Concerns regarding medicines are outlined above.  Medication changes, Labs and Tests ordered today are listed in the Patient Instructions below.  There are no Patient Instructions on file for this visit.   Signed, Fransico Him, MD  08/22/2020 10:09 AM    [address], [place], [place]  [address] Phone: [phone]; Fax: [phone]

## 2020-08-24 ENCOUNTER — Telehealth (HOSPITAL_COMMUNITY): Payer: Self-pay | Admitting: Emergency Medicine

## 2020-08-24 NOTE — Telephone Encounter (Signed)
Reaching out to patient to offer assistance regarding upcoming cardiac imaging study; pt verbalizes understanding of appt date/time, parking situation and where to check in, pre-test NPO status and medications ordered, and verified current allergies; name and call back number provided for further questions should they arise Marchia Bond RN Navigator Cardiac Imaging [facility] [phone] office [phone] cell  Pt taking metoprolol , holding hyzaar Clarise Cruz

## 2020-08-27 ENCOUNTER — Telehealth: Payer: Self-pay

## 2020-08-27 ENCOUNTER — Other Ambulatory Visit: Payer: Self-pay

## 2020-08-27 ENCOUNTER — Ambulatory Visit (HOSPITAL_COMMUNITY)
Admission: RE | Admit: 2020-08-27 | Discharge: 2020-08-27 | Disposition: A | Discharge status: Home / Self Care | Payer: Medicare Other | Source: Ambulatory Visit | Attending: Cardiology | Admitting: Cardiology

## 2020-08-27 ENCOUNTER — Encounter (HOSPITAL_COMMUNITY): Payer: Self-pay

## 2020-08-27 DIAGNOSIS — R931 Abnormal findings on diagnostic imaging of heart and coronary circulation: Secondary | ICD-10-CM

## 2020-08-27 DIAGNOSIS — R0600 Dyspnea, unspecified: Secondary | ICD-10-CM

## 2020-08-27 DIAGNOSIS — Z006 Encounter for examination for normal comparison and control in clinical research program: Secondary | ICD-10-CM

## 2020-08-27 DIAGNOSIS — R072 Precordial pain: Secondary | ICD-10-CM | POA: Insufficient documentation

## 2020-08-27 DIAGNOSIS — R079 Chest pain, unspecified: Secondary | ICD-10-CM

## 2020-08-27 DIAGNOSIS — R0609 Other forms of dyspnea: Secondary | ICD-10-CM

## 2020-08-27 HISTORY — DX: Atherosclerotic heart disease of native coronary artery without angina pectoris: I25.10

## 2020-08-27 MED ORDER — IOHEXOL 350 MG/ML SOLN
100.0000 mL | Freq: Once | INTRAVENOUS | Status: AC | PRN
  Administered 2020-08-27: 100 mL via INTRAVENOUS

## 2020-08-27 MED ORDER — NITROGLYCERIN 0.4 MG SL SUBL: 0.8000 mg | SUBLINGUAL_TABLET | Freq: Once | SUBLINGUAL | Status: AC

## 2020-08-27 MED ORDER — NITROGLYCERIN 0.4 MG SL SUBL
SUBLINGUAL_TABLET | SUBLINGUAL | Status: AC
  Administered 2020-08-27: 0.8 mg via SUBLINGUAL
  Filled 2020-08-27: qty 2

## 2020-08-27 NOTE — Research (Signed)
IDENTIFY Informed Consent                  Subject Name: [name]    Subject met inclusion and exclusion criteria.  The informed consent form, study requirements and expectations were reviewed with the subject and questions and concerns were addressed prior to the signing of the consent form.  The subject verbalized understanding of the trial requirements.  The subject agreed to participate in the IDENTIFY trial and signed the informed consent at 11:06AM on 08/27/20.  The informed consent was obtained prior to performance of any protocol-specific procedures for the subject.  A copy of the signed informed consent was given to the subject and a copy was placed in the subject's medical record.   Meade Maw , Naval architect

## 2020-08-27 NOTE — Telephone Encounter (Signed)
Kelli Margarita, MD  08/27/2020 3:01 PM EDT      Minimal calcified plaque in the distal LM with 0-24% stenosis with low Ca score. Please have her come in for FLP   Patient will come in on Wednesday for lab work. Referral has been placed for pulmonology

## 2020-08-27 NOTE — Telephone Encounter (Signed)
-----   Message from Sueanne Margarita, MD sent at 08/27/2020  3:02 PM EDT ----- Refer to pulmonary for severe DOE

## 2020-08-29 ENCOUNTER — Other Ambulatory Visit: Payer: Self-pay

## 2020-08-29 ENCOUNTER — Other Ambulatory Visit: Payer: Medicare Other | Admitting: *Deleted

## 2020-08-29 DIAGNOSIS — R931 Abnormal findings on diagnostic imaging of heart and coronary circulation: Secondary | ICD-10-CM | POA: Diagnosis not present

## 2020-08-29 DIAGNOSIS — R079 Chest pain, unspecified: Secondary | ICD-10-CM | POA: Diagnosis not present

## 2020-08-29 LAB — LIPID PANEL
Chol/HDL Ratio: 3 ratio (ref 0.0–4.4)
Cholesterol, Total: 172 mg/dL (ref 100–199)
HDL: 57 mg/dL (ref >39)
LDL Chol Calc (NIH): 94 mg/dL (ref 0–99)
Triglycerides: 121 mg/dL (ref 0–149)
VLDL Cholesterol Cal: 21 mg/dL (ref 5–40)

## 2020-08-31 ENCOUNTER — Telehealth: Payer: Self-pay

## 2020-08-31 DIAGNOSIS — R079 Chest pain, unspecified: Secondary | ICD-10-CM

## 2020-08-31 MED ORDER — EZETIMIBE 10 MG PO TABS: 10.0000 mg | ORAL_TABLET | Freq: Every day | ORAL | 3 refills | Status: DC

## 2020-08-31 NOTE — Telephone Encounter (Signed)
-----   Message from Sueanne Margarita, MD sent at 08/29/2020  5:53 PM EDT ----- LDL too high - add Zetia 10mg  daily and repeat FLP and ALT in 8 weeks

## 2020-08-31 NOTE — Telephone Encounter (Signed)
The patient has been notified of the result and verbalized understanding.  All questions (if any) were answered. Antonieta Iba, RN 08/31/2020 9:35 AM

## 2020-09-12 DIAGNOSIS — F411 Generalized anxiety disorder: Secondary | ICD-10-CM | POA: Diagnosis not present

## 2020-09-12 DIAGNOSIS — R0609 Other forms of dyspnea: Secondary | ICD-10-CM | POA: Diagnosis not present

## 2020-10-04 ENCOUNTER — Encounter: Payer: Self-pay | Admitting: Pulmonary Disease

## 2020-10-04 ENCOUNTER — Other Ambulatory Visit: Payer: Self-pay

## 2020-10-04 ENCOUNTER — Ambulatory Visit (INDEPENDENT_AMBULATORY_CARE_PROVIDER_SITE_OTHER): Payer: Medicare Other | Admitting: Pulmonary Disease

## 2020-10-04 ENCOUNTER — Ambulatory Visit: Payer: Medicare Other

## 2020-10-04 DIAGNOSIS — R0602 Shortness of breath: Secondary | ICD-10-CM | POA: Diagnosis not present

## 2020-10-04 NOTE — Assessment & Plan Note (Signed)
No clear reason identified for shortness of breath today.  Her description of "elephant sitting on her chest" and exertion related symptoms seems to point towards cardiac etiology but cardiac CT has been reassuring. She has no reason to have airway disease, never smoker and no prior history of asthma.  She does not have any evidence of pulmonary fibrosis on CT or pulmonary hypertension on echo.  I doubt VTE here given ongoing symptoms for 10 months and no desaturation on exertion, but we can proceed with VQ scan to clarify.  We will also schedule PFTs.  If this testing negative, then refer back to cardiology perhaps for cardiac stress testing

## 2020-10-04 NOTE — Progress Notes (Signed)
Subjective:    Patient ID: [name], female    DOB: [birth year], 75 y.o.   MRN: [id number]  HPI  75 year old never smoker, receptionist at [employer], presents for evaluation of dyspnea on exertion for 10 months She reports shortness of breath on daily activities which feels like "an elephant sitting on my chest".  This occurs even with a little bit of walking up to the nurses station, sitting still seems to relieve the symptoms but has continued activity makes it worse.  She reports occasional chest pressure that will wake her up from sleep and she has to hold a pillow against her chest. She apparently had a Zio patch with her PCP which did not show any arrhythmias. She underwent echocardiogram and CT coronaries which showed normal LV function and minimal nonobstructive CAD, and she is referred by cardiology to [place] for pulmonary evaluation of shortness of breath She denies associated wheezing, cough or frequent chest colds. There is no personal or family history of VTE  PMH -left breast cancer 2013 Hypertension hyperlipidemia  She is raising her 59 year old grandchild and admits to significant stressors related to this.  She is working 4 days a week.  On ambulation oxygen saturation stayed at 98% after walking 3 laps, heart rate increased from 59-78, she was mildly short of breath and lungs sounded clear  Significant tests/ events reviewed  07/2020 echocardiogram normal LVEF, grade 1 diastolic dysfunction. 07/2020 CT coronaries -minimal nonobstructive CAD, calcium score 43  Past Medical History:  Diagnosis Date  . Arthritis   . Breast CA ([place])   . CAD (coronary artery disease), native coronary artery    Minimal calcified plaque in the distal LM with 0-24% stenosis with low Ca score by coronary CTA 07/2020  . Chest pain   . CKD (chronic kidney disease), stage III ([place])   . Depression   . Diastolic dysfunction   . Diverticulosis   . Dyslipidemia   . GERD (gastroesophageal  reflux disease)   . Heart murmur    heard one when she was pregnamt-maybe had echo-cannot remember  . Hepatic cyst   . Hyperlipidemia   . Hyperplastic colon polyp   . Hypertension   . Incontinence of urine   . Invasive ductal carcinoma of breast ([place]) 2013   Left  . Iron deficiency anemia   . Left rib fracture   . Lumbar radiculopathy   . Obesity   . Osteopenia   . S/P radiation therapy  6-30 to 01-12-13                                                       6-30 to 01-12-13                                                         , Left Breast boost / 10 Gy in 5 fractions                    . Spinal stenosis   . Status post chemotherapy    chemotherapy consisting of [place] on 07/14/12 with 6 cycles planned. Every 3 week Herceptin starting 11/03/12.  Past Surgical History:  Procedure Laterality Date  . ABDOMINAL ADHESION SURGERY  1980  . APPENDECTOMY    . Benign Breast Biopsy  1994   Left  . BLADDER SUSPENSION  10/2010  . BREAST LUMPECTOMY Left 06/10/2012  . BREAST LUMPECTOMY WITH NEEDLE LOCALIZATION AND AXILLARY SENTINEL LYMPH NODE BX  06/10/2012   Procedure: BREAST LUMPECTOMY WITH NEEDLE LOCALIZATION AND AXILLARY SENTINEL LYMPH NODE BX;  Surgeon: Rolm Bookbinder, MD;  Location: [facility];  Service: General;  Laterality: Left;  . CHOLECYSTECTOMY    . PORTACATH PLACEMENT  07/07/2012   Procedure: INSERTION PORT-A-CATH;  Surgeon: Rolm Bookbinder, MD;  Location: [facility];  Service: General;  Laterality: Left;  Port a cath insertion  . TONSILLECTOMY AND ADENOIDECTOMY     age 30  . TUBAL LIGATION  1988    No Known Allergies  Social History   Socioeconomic History  . Marital status: Legally Separated    Spouse name: Not on file  . Number of children: Not on file  . Years of education: Not on file  . Highest education level: Not on file  Occupational History  . Not on file  Tobacco Use  . Smoking status: Never Smoker  . Smokeless tobacco: Never  Used  Substance and Sexual Activity  . Alcohol use: Yes    Alcohol/week: 1.0 standard drink    Types: 1 Glasses of wine per week    Comment: 1 glass of Wine per Week  . Drug use: No  . Sexual activity: Not Currently    Birth control/protection: Post-menopausal    Comment: Menarche age 33, parity age 22, G28, P60, Menopause age 66, No HRT  Other Topics Concern  . Not on file  Social History Narrative  . Not on file   Social Determinants of Health   Financial Resource Strain: Not on file  Food Insecurity: Not on file  Transportation Needs: Not on file  Physical Activity: Not on file  Stress: Not on file  Social Connections: Not on file  Intimate Partner Violence: Not on file     Family History  Problem Relation Age of Onset  . Colon cancer Mother 100  . Breast cancer Sister 60  . Prostate cancer Father 70  . CAD Father   . Stomach cancer Maternal Grandmother 67     Review of Systems  Positive for shortness of breath, occasional skipped beats Stiff joints  Constitutional: negative for anorexia, fevers and sweats  Eyes: negative for irritation, redness and visual disturbance  Ears, nose, mouth, throat, and face: negative for earaches, epistaxis, nasal congestion and sore throat  Respiratory: negative for cough,  sputum and wheezing  Cardiovascular: negative for lower extremity edema, orthopnea and syncope  Gastrointestinal: negative for abdominal pain, constipation, diarrhea, melena, nausea and vomiting  Genitourinary:negative for dysuria, frequency and hematuria  Hematologic/lymphatic: negative for bleeding, easy bruising and lymphadenopathy  Musculoskeletal:negative for arthralgias, muscle weakness  Neurological: negative for coordination problems, gait problems, headaches and weakness  Endocrine: negative for diabetic symptoms including polydipsia, polyuria and weight loss     Objective:   Physical Exam  Gen. Pleasant, obese, in no distress, normal affect ENT -  no pallor,icterus, no post nasal drip, class 2-3 airway Neck: No JVD, no thyromegaly, no carotid bruits Lungs: no use of accessory muscles, no dullness to percussion, decreased without rales or rhonchi  Cardiovascular: Rhythm regular, heart sounds  normal, no murmurs or gallops, no peripheral edema Abdomen: soft and non-tender, no hepatosplenomegaly, BS  normal. Musculoskeletal: No deformities, no cyanosis or clubbing Neuro:  alert, non focal, no tremors       Assessment & Plan:

## 2020-10-04 NOTE — Patient Instructions (Addendum)
No reason identified for your shortness of breath today Check for blood clots with VQ scan. Schedule PFTs.  If these tests are negative, then would refer back to cardiology

## 2020-10-05 ENCOUNTER — Ambulatory Visit (HOSPITAL_COMMUNITY): Payer: Medicare Other

## 2020-10-08 ENCOUNTER — Other Ambulatory Visit (HOSPITAL_COMMUNITY)
Admission: RE | Admit: 2020-10-08 | Discharge: 2020-10-08 | Disposition: A | Discharge status: Home / Self Care | Payer: Medicare Other | Source: Ambulatory Visit | Attending: Pulmonary Disease | Admitting: Pulmonary Disease

## 2020-10-08 DIAGNOSIS — Z01812 Encounter for preprocedural laboratory examination: Secondary | ICD-10-CM | POA: Insufficient documentation

## 2020-10-08 DIAGNOSIS — Z20822 Contact with and (suspected) exposure to covid-19: Secondary | ICD-10-CM | POA: Insufficient documentation

## 2020-10-08 LAB — SARS CORONAVIRUS 2 (TAT 6-24 HRS): SARS Coronavirus 2: NEGATIVE

## 2020-10-10 ENCOUNTER — Other Ambulatory Visit: Payer: Self-pay

## 2020-10-10 ENCOUNTER — Ambulatory Visit (INDEPENDENT_AMBULATORY_CARE_PROVIDER_SITE_OTHER): Payer: Medicare Other | Admitting: Pulmonary Disease

## 2020-10-10 ENCOUNTER — Ambulatory Visit (HOSPITAL_COMMUNITY)
Admission: RE | Admit: 2020-10-10 | Discharge: 2020-10-10 | Disposition: A | Discharge status: Home / Self Care | Payer: Medicare Other | Source: Ambulatory Visit | Attending: Pulmonary Disease | Admitting: Pulmonary Disease

## 2020-10-10 ENCOUNTER — Encounter (HOSPITAL_COMMUNITY)
Admission: RE | Admit: 2020-10-10 | Discharge: 2020-10-10 | Disposition: A | Discharge status: Home / Self Care | Payer: Medicare Other | Source: Ambulatory Visit | Attending: Pulmonary Disease | Admitting: Pulmonary Disease

## 2020-10-10 DIAGNOSIS — C50919 Malignant neoplasm of unspecified site of unspecified female breast: Secondary | ICD-10-CM | POA: Diagnosis not present

## 2020-10-10 DIAGNOSIS — R0602 Shortness of breath: Secondary | ICD-10-CM | POA: Insufficient documentation

## 2020-10-10 DIAGNOSIS — I2510 Atherosclerotic heart disease of native coronary artery without angina pectoris: Secondary | ICD-10-CM | POA: Diagnosis not present

## 2020-10-10 DIAGNOSIS — I10 Essential (primary) hypertension: Secondary | ICD-10-CM | POA: Diagnosis not present

## 2020-10-10 DIAGNOSIS — R0789 Other chest pain: Secondary | ICD-10-CM | POA: Diagnosis not present

## 2020-10-10 LAB — PULMONARY FUNCTION TEST
DL/VA % pred: 131 %
DL/VA: 5.51 ml/min/mmHg/L
DLCO cor % pred: 106 %
DLCO cor: 18.29 ml/min/mmHg
DLCO unc % pred: 106 %
DLCO unc: 18.29 ml/min/mmHg
FEF 25-75 Post: 1 L/sec
FEF 25-75 Pre: 0.92 L/sec
FEF2575-%Change-Post: 8 %
FEF2575-%Pred-Post: 69 %
FEF2575-%Pred-Pre: 64 %
FEV1-%Change-Post: -6 %
FEV1-%Pred-Post: 69 %
FEV1-%Pred-Pre: 74 %
FEV1-Post: 1.25 L
FEV1-Pre: 1.35 L
FEV1FVC-%Change-Post: -9 %
FEV1FVC-%Pred-Pre: 88 %
FEV6-%Change-Post: 4 %
FEV6-%Pred-Post: 90 %
FEV6-%Pred-Pre: 87 %
FEV6-Post: 2.08 L
FEV6-Pre: 1.99 L
FEV6FVC-%Pred-Post: 105 %
FEV6FVC-%Pred-Pre: 105 %
FVC-%Change-Post: 2 %
FVC-%Pred-Post: 86 %
FVC-%Pred-Pre: 84 %
FVC-Post: 2.08 L
FVC-Pre: 2.03 L
Post FEV1/FVC ratio: 60 %
Post FEV6/FVC ratio: 100 %
Pre FEV1/FVC ratio: 66 %
Pre FEV6/FVC Ratio: 100 %
RV % pred: 98 %
RV: 2.12 L
TLC % pred: 88 %
TLC: 4.09 L

## 2020-10-10 MED ORDER — TECHNETIUM TO 99M ALBUMIN AGGREGATED
4.3000 | Freq: Once | INTRAVENOUS | Status: AC | PRN
  Administered 2020-10-10: 4.3 via INTRAVENOUS

## 2020-10-10 NOTE — Patient Instructions (Signed)
Full PFT performed today. °

## 2020-10-10 NOTE — Progress Notes (Signed)
Full PFT performed today. °

## 2020-10-11 ENCOUNTER — Telehealth: Payer: Self-pay | Admitting: Pulmonary Disease

## 2020-10-11 MED ORDER — ANORO ELLIPTA 62.5-25 MCG/INH IN AEPB: 1.0000 | INHALATION_SPRAY | Freq: Every day | RESPIRATORY_TRACT | 0 refills | Status: AC

## 2020-10-11 MED ORDER — CEFDINIR 300 MG PO CAPS: 300.0000 mg | ORAL_CAPSULE | Freq: Two times a day (BID) | ORAL | 0 refills | Status: AC

## 2020-10-11 NOTE — Telephone Encounter (Signed)
VQ scan was negative, no evidence of blood clots, this is good news However chest x-ray showed a faint infiltrate in the right lower lung -she does not have any symptoms of pneumonia but I may suggest to take an antibiotic, Omnicef 300 twice daily for 7 days PFTs showed mild evidence of airway obstruction, FEV1 was 74%, 80% will be passing grade -Would suggest trial of Anoro daily and see if this helps with her shortness of breath  If above measures do not help, then she would have to go back to cardiology

## 2020-10-11 NOTE — Telephone Encounter (Signed)
Called and went over VQ, xray and PFT results per Dr Elsworth Soho with patient.  All questions answered and patient expressed full understanding of results. Patient expressed full understanding of and agreeable with Dr Bari Mantis recommendations for [facility] and Anoro. Orders placed per Dr Elsworth Soho for Carole Civil and Jearl Klinefelter sent to [facility] on [address] per patient request. Patient educated on how to take [place] and Anoro inhaler. All questions answered and patient provided teach back. Patient advised to call to let Dr Elsworth Soho know how she is doing when completed omnicef and how the anoro is working for her and she expressed full understanding that if these are not helping then she would have to go back to cardiology. Will route to Dr Elsworth Soho as Juluis Rainier. Nothing further needed at this time.

## 2020-10-16 DIAGNOSIS — Z23 Encounter for immunization: Secondary | ICD-10-CM | POA: Diagnosis not present

## 2020-10-17 ENCOUNTER — Other Ambulatory Visit (HOSPITAL_COMMUNITY): Payer: Medicare Other

## 2020-10-17 ENCOUNTER — Encounter (HOSPITAL_COMMUNITY): Payer: Medicare Other

## 2020-10-17 ENCOUNTER — Ambulatory Visit (HOSPITAL_COMMUNITY): Payer: Medicare Other

## 2020-10-31 ENCOUNTER — Other Ambulatory Visit: Payer: Medicare Other

## 2020-10-31 DIAGNOSIS — R079 Chest pain, unspecified: Secondary | ICD-10-CM | POA: Diagnosis not present

## 2020-11-01 LAB — LIPID PANEL
Chol/HDL Ratio: 2.7 ratio (ref 0.0–4.4)
Cholesterol, Total: 137 mg/dL (ref 100–199)
HDL: 50 mg/dL (ref >39)
LDL Chol Calc (NIH): 61 mg/dL (ref 0–99)
Triglycerides: 149 mg/dL (ref 0–149)
VLDL Cholesterol Cal: 26 mg/dL (ref 5–40)

## 2020-11-01 LAB — ALT: ALT: 29 IU/L (ref 0–32)

## 2020-12-05 DIAGNOSIS — N183 Chronic kidney disease, stage 3 unspecified: Secondary | ICD-10-CM | POA: Diagnosis not present

## 2020-12-05 DIAGNOSIS — Z853 Personal history of malignant neoplasm of breast: Secondary | ICD-10-CM | POA: Diagnosis not present

## 2020-12-05 DIAGNOSIS — F411 Generalized anxiety disorder: Secondary | ICD-10-CM | POA: Diagnosis not present

## 2020-12-05 DIAGNOSIS — R7303 Prediabetes: Secondary | ICD-10-CM | POA: Diagnosis not present

## 2020-12-05 DIAGNOSIS — I10 Essential (primary) hypertension: Secondary | ICD-10-CM | POA: Diagnosis not present

## 2020-12-25 ENCOUNTER — Telehealth: Payer: Self-pay

## 2020-12-25 DIAGNOSIS — Z006 Encounter for examination for normal comparison and control in clinical research program: Secondary | ICD-10-CM

## 2020-12-25 NOTE — Telephone Encounter (Signed)
Called patient for 90 day Identify phone call no answer, I left a voicemail stating the intent of the phone call and our call back number to be reached in our department. 

## 2021-01-09 DIAGNOSIS — U071 COVID-19: Secondary | ICD-10-CM | POA: Diagnosis not present

## 2021-02-01 ENCOUNTER — Telehealth: Payer: Self-pay

## 2021-02-01 DIAGNOSIS — Z006 Encounter for examination for normal comparison and control in clinical research program: Secondary | ICD-10-CM

## 2021-02-01 NOTE — Telephone Encounter (Signed)
I called patient for her 90-day Identify Study follow up phone call. Patient is doing well with no cardiac symptoms at this time. I reminded patient I would call her in April for her 1 year follow-up.

## 2021-02-14 ENCOUNTER — Other Ambulatory Visit: Payer: Self-pay | Admitting: Critical Care Medicine

## 2021-02-14 ENCOUNTER — Other Ambulatory Visit: Payer: Self-pay | Admitting: Internal Medicine

## 2021-02-14 DIAGNOSIS — Z1231 Encounter for screening mammogram for malignant neoplasm of breast: Secondary | ICD-10-CM

## 2021-02-18 DIAGNOSIS — J209 Acute bronchitis, unspecified: Secondary | ICD-10-CM | POA: Diagnosis not present

## 2021-02-27 DIAGNOSIS — Z23 Encounter for immunization: Secondary | ICD-10-CM | POA: Diagnosis not present

## 2021-03-02 DIAGNOSIS — Z20822 Contact with and (suspected) exposure to covid-19: Secondary | ICD-10-CM | POA: Diagnosis not present

## 2021-03-27 ENCOUNTER — Other Ambulatory Visit: Payer: Self-pay

## 2021-03-27 ENCOUNTER — Ambulatory Visit
Admission: RE | Admit: 2021-03-27 | Discharge: 2021-03-27 | Disposition: A | Discharge status: Home / Self Care | Payer: Medicare Other | Source: Ambulatory Visit | Attending: Internal Medicine | Admitting: Internal Medicine

## 2021-03-27 DIAGNOSIS — Z1231 Encounter for screening mammogram for malignant neoplasm of breast: Secondary | ICD-10-CM | POA: Diagnosis not present

## 2021-04-02 DIAGNOSIS — Z20822 Contact with and (suspected) exposure to covid-19: Secondary | ICD-10-CM | POA: Diagnosis not present

## 2021-05-07 DIAGNOSIS — H2513 Age-related nuclear cataract, bilateral: Secondary | ICD-10-CM | POA: Diagnosis not present

## 2021-07-31 DIAGNOSIS — Z853 Personal history of malignant neoplasm of breast: Secondary | ICD-10-CM | POA: Diagnosis not present

## 2021-07-31 DIAGNOSIS — F341 Dysthymic disorder: Secondary | ICD-10-CM | POA: Diagnosis not present

## 2021-07-31 DIAGNOSIS — Z23 Encounter for immunization: Secondary | ICD-10-CM | POA: Diagnosis not present

## 2021-07-31 DIAGNOSIS — E7800 Pure hypercholesterolemia, unspecified: Secondary | ICD-10-CM | POA: Diagnosis not present

## 2021-07-31 DIAGNOSIS — K219 Gastro-esophageal reflux disease without esophagitis: Secondary | ICD-10-CM | POA: Diagnosis not present

## 2021-07-31 DIAGNOSIS — Z0000 Encounter for general adult medical examination without abnormal findings: Secondary | ICD-10-CM | POA: Diagnosis not present

## 2021-07-31 DIAGNOSIS — J984 Other disorders of lung: Secondary | ICD-10-CM | POA: Diagnosis not present

## 2021-07-31 DIAGNOSIS — I10 Essential (primary) hypertension: Secondary | ICD-10-CM | POA: Diagnosis not present

## 2021-07-31 DIAGNOSIS — F411 Generalized anxiety disorder: Secondary | ICD-10-CM | POA: Diagnosis not present

## 2021-07-31 DIAGNOSIS — Z1389 Encounter for screening for other disorder: Secondary | ICD-10-CM | POA: Diagnosis not present

## 2021-07-31 DIAGNOSIS — N183 Chronic kidney disease, stage 3 unspecified: Secondary | ICD-10-CM | POA: Diagnosis not present

## 2021-07-31 DIAGNOSIS — M5136 Other intervertebral disc degeneration, lumbar region: Secondary | ICD-10-CM | POA: Diagnosis not present

## 2021-07-31 DIAGNOSIS — R7303 Prediabetes: Secondary | ICD-10-CM | POA: Diagnosis not present

## 2021-07-31 DIAGNOSIS — M8580 Other specified disorders of bone density and structure, unspecified site: Secondary | ICD-10-CM | POA: Diagnosis not present

## 2021-08-06 ENCOUNTER — Other Ambulatory Visit: Payer: Self-pay | Admitting: Internal Medicine

## 2021-08-06 DIAGNOSIS — M8580 Other specified disorders of bone density and structure, unspecified site: Secondary | ICD-10-CM

## 2021-08-12 ENCOUNTER — Encounter: Payer: Self-pay | Admitting: Gastroenterology

## 2021-08-14 DIAGNOSIS — Z1211 Encounter for screening for malignant neoplasm of colon: Secondary | ICD-10-CM | POA: Diagnosis not present

## 2021-08-17 ENCOUNTER — Other Ambulatory Visit: Payer: Self-pay | Admitting: Cardiology

## 2021-09-01 DIAGNOSIS — Z1211 Encounter for screening for malignant neoplasm of colon: Secondary | ICD-10-CM | POA: Diagnosis not present

## 2021-09-01 DIAGNOSIS — Z1212 Encounter for screening for malignant neoplasm of rectum: Secondary | ICD-10-CM | POA: Diagnosis not present

## 2021-09-19 ENCOUNTER — Encounter: Payer: Self-pay | Admitting: Gastroenterology

## 2021-09-19 ENCOUNTER — Telehealth: Payer: Self-pay | Admitting: Gastroenterology

## 2021-10-09 ENCOUNTER — Ambulatory Visit (INDEPENDENT_AMBULATORY_CARE_PROVIDER_SITE_OTHER): Payer: Medicare Other | Admitting: Gastroenterology

## 2021-10-09 ENCOUNTER — Encounter: Payer: Self-pay | Admitting: Gastroenterology

## 2021-10-09 ENCOUNTER — Ambulatory Visit: Payer: Medicare Other | Admitting: Gastroenterology

## 2021-10-09 VITALS — BP 128/70 | HR 59 | Ht 61.0 in | Wt 173.0 lb

## 2021-10-09 DIAGNOSIS — Z860100 Personal history of colon polyps, unspecified: Secondary | ICD-10-CM | POA: Insufficient documentation

## 2021-10-09 DIAGNOSIS — R1032 Left lower quadrant pain: Secondary | ICD-10-CM | POA: Diagnosis not present

## 2021-10-09 MED ORDER — SUTAB 1479-225-188 MG PO TABS
24.0000 | ORAL_TABLET | ORAL | 0 refills | Status: DC
Start: 2021-10-09 — End: 2021-12-04

## 2021-10-09 MED ORDER — AMOXICILLIN-POT CLAVULANATE 875-125 MG PO TABS: 1.0000 | ORAL_TABLET | Freq: Two times a day (BID) | ORAL | 0 refills | Status: AC

## 2021-10-09 NOTE — Progress Notes (Signed)
? ? ? ?10/09/2021 ?[name] ?[id number] ?1945-06-20 ? ? ?HISTORY OF PRESENT ILLNESS: This is a 76 year old female who is a patient Dr. Doyne Keel.  She is here today to discuss colonoscopy.  Her last colonoscopy was in November 2017 with polyps as listed below.  It was recommended that she have a repeat colonoscopy at a 5-year interval.  Her PCP recently ordered a Cologuard that was positive.  She tells me that she has been having left-sided abdominal pain.  She says that its been there for the past couple of months.  Historically even back in 2017 when she had a colonoscopy she had been complaining of left-sided abdominal pain that was thought to possibly be musculoskeletal.  That being said, she does have a history of diverticulitis seen on imaging via CT scan in 2018 as well.  She denies any change in bowel habits, says she moves her bowels regularly every day, no rectal bleeding, no fevers or chills, no nausea or vomiting. ? ?Colonoscopy 04/2016: ? ?- Diverticulosis in the left colon. ?- One 5 mm polyp in the cecum, removed with a cold snare. Resected and retrieved. ?- One 7 mm polyp in the ascending colon, removed with a cold snare. Resected and retrieved. ?- The examined portion of the ileum was normal. ?- Internal hemorrhoids. ?- The examination was otherwise normal. ?- Biopsies were taken with a cold forceps from the right colon and left colon for evaluation of ?microscopic colitis. ? ?1. Surgical [P], ascending and cecum, polyp (2) ?-TUBULAR ADENOMA AND SESSILE SERRATED POLYP/ADENOMA. ?-NO HIGH GRADE DYSPLASIA OR MALIGNANCY IDENTIFIED. ?2. Surgical [P], random colon ?-BENIGN COLONIC MUCOSA WITH NO HISTOPATHOLOGIC ABNORMALITY. ?-NO EVIDENCE OF LYMPHOCYTIC OR COLLAGENOUS COLITIS, INFLAMMATORY BOWEL DISEASE OR MALIGNANCY. ? ? ?Past Medical History:  ?Diagnosis Date  ? Arthritis   ? Breast CA ([place])   ? CAD (coronary artery disease), native coronary artery   ? Minimal calcified plaque in the distal LM  with 0-24% stenosis with low Ca score by coronary CTA 07/2020  ? Chest pain   ? CKD (chronic kidney disease), stage III ([place])   ? Depression   ? Diastolic dysfunction   ? Diverticulosis   ? Dyslipidemia   ? GERD (gastroesophageal reflux disease)   ? Heart murmur   ? heard one when she was pregnamt-maybe had echo-cannot remember  ? Hepatic cyst   ? Hyperlipidemia   ? Hyperplastic colon polyp   ? Hypertension   ? Incontinence of urine   ? Invasive ductal carcinoma of breast ([place]) 2013  ? Left  ? Iron deficiency anemia   ? Left rib fracture   ? Lumbar radiculopathy   ? Obesity   ? Osteopenia   ? S/P radiation therapy  6-30 to 01-12-13                                                       6-30 to 01-12-13                                                        ? , Left Breast boost / 10 Gy in 5 fractions                    ?  Spinal stenosis   ? Status post chemotherapy   ? chemotherapy consisting of [place] on 07/14/12 with 6 cycles planned. Every 3 week Herceptin starting 11/03/12.  ? ?Past Surgical History:  ?Procedure Laterality Date  ? ABDOMINAL ADHESION SURGERY  1980  ? APPENDECTOMY    ? Benign Breast Biopsy  1994  ? Left  ? BLADDER SUSPENSION  10/2010  ? BREAST LUMPECTOMY Left 06/10/2012  ? BREAST LUMPECTOMY WITH NEEDLE LOCALIZATION AND AXILLARY SENTINEL LYMPH NODE BX  06/10/2012  ? Procedure: BREAST LUMPECTOMY WITH NEEDLE LOCALIZATION AND AXILLARY SENTINEL LYMPH NODE BX;  Surgeon: Rolm Bookbinder, MD;  Location: [facility];  Service: General;  Laterality: Left;  ? CHOLECYSTECTOMY    ? PORTACATH PLACEMENT  07/07/2012  ? Procedure: INSERTION PORT-A-CATH;  Surgeon: Rolm Bookbinder, MD;  Location: [place];  Service: General;  Laterality: Left;  Port a cath insertion  ? TONSILLECTOMY AND ADENOIDECTOMY    ? age 77  ? TUBAL LIGATION  1988  ? ? reports that she has never smoked. She has never used smokeless tobacco. She reports current alcohol use of about 1.0 standard drink per week. She reports  that she does not use drugs. ?family history includes Breast cancer (age of onset: 63) in her sister; CAD in her father; Colon cancer (age of onset: 45) in her mother; Prostate cancer (age of onset: 28) in her father; Stomach cancer (age of onset: 74) in her maternal grandmother. ?No Known Allergies ? ?  ?Outpatient Encounter Medications as of 10/09/2021  ?Medication Sig  ? anastrozole (ARIMIDEX) 1 MG tablet Take 1 tablet (1 mg total) by mouth daily.  ? Ascorbic Acid (VITAMIN C) 1000 MG tablet Take 1,000 mg by mouth daily.  ? aspirin 81 MG tablet Take 81 mg by mouth daily.  ? atenolol (TENORMIN) 50 MG tablet Take 50 mg by mouth daily.  ? Biotin 10000 MCG TABS Take 1 tablet by mouth daily.  ? Calcium Carbonate (CALCIUM 600 PO) Take 2 capsules by mouth daily.  ? Cholecalciferol (VITAMIN D3) 10000 units TABS Take 1 tablet by mouth daily.  ? citalopram (CELEXA) 20 MG tablet Take 20 mg by mouth daily.  ? ezetimibe (ZETIA) 10 MG tablet Take 1 tablet (10 mg total) by mouth daily. Please schedule appt for future refills. 1st attempt  ? Ferrous Sulfate (IRON) 325 (65 Fe) MG TABS Take by mouth.  ? losartan-hydrochlorothiazide (HYZAAR) 50-12.5 MG tablet Take 1 tablet by mouth daily.  ? naproxen (NAPROSYN) 500 MG tablet Take 500 mg by mouth 2 (two) times daily as needed.  ? omeprazole (PRILOSEC) 40 MG capsule Take 40 mg by mouth daily.  ? simvastatin (ZOCOR) 80 MG tablet Take 40 mg by mouth at bedtime.   ? umeclidinium-vilanterol (ANORO ELLIPTA) 62.5-25 MCG/INH AEPB Inhale 1 puff into the lungs daily.  ? vitamin E 400 UNIT capsule Take 400 Units by mouth daily.  ? Zinc 50 MG TABS Take by mouth.  ? ?No facility-administered encounter medications on file as of 10/09/2021.  ? ? ? ?REVIEW OF SYSTEMS  : All other systems reviewed and negative except where noted in the History of Present Illness. ? ? ?PHYSICAL EXAM: ?BP 128/70   Pulse (!) 59   Ht '5\' 1"'$  (1.549 m)   Wt 173 lb (78.5 kg)   SpO2 97%   BMI 32.69 kg/m?  ?General: Well  developed white female in no acute distress ?Head: Normocephalic and atraumatic ?Eyes:  Sclerae anicteric, conjunctiva pink. ?Ears: Normal auditory  acuity ?Lungs: Clear throughout to auscultation; no W/R/R. ?Heart: Regular rate and rhythm; no M/R/G. ?Abdomen: Soft, non-distended.  BS present.  Left sided TTP. ?Rectal:  Will be done at the time of colonoscopy. ?Musculoskeletal: Symmetrical with no gross deformities  ?Skin: No lesions on visible extremities ?Extremities: No edema  ?Neurological: Alert oriented x 4, grossly non-focal ?Psychological:  Alert and cooperative. Normal mood and affect ? ?ASSESSMENT AND PLAN: ?*Personal history of colon polyps: Has history of polyps, adenomatous on previous colonoscopies.  Last November 2017.  Repeat was recommended in 5 years.  PCP recently ordered a Cologuard that was positive.  We will plan for colonoscopy with Dr. Havery Moros.  We will plan for July since we are treating her empirically for diverticulitis at this time. ?*LLQ abdominal pain: She complains of left-sided abdominal pain that she said has been there for couple of months.  No change in bowel habits, rectal bleeding, fevers or chills, nausea or vomiting.  She does have a history of diverticulitis, but also has a history of ongoing complaints of abdominal pain that previously thought were possibly musculoskeletal.  I will empirically treat her for diverticulitis with a 7-day course of Augmentin 875 mg twice daily.  Prescription sent to pharmacy.  She will call us back in about a week and give [place] an update on her symptoms. ? ?**The risks, benefits, and alternatives to colonoscopy were discussed with the patient and she consents to proceed.  ? ?CC:  Wenda Low, MD ? ?  ?

## 2021-10-09 NOTE — Patient Instructions (Addendum)
We have sent the following medications to your pharmacy for you to pick up at your convenience: ?Augmentin , Sutab ? ?Please call or send message to office with updates on abdomen pain 1 week after completing antibiotics.   ? ?You have been scheduled for a colonoscopy. Please follow written instructions given to you at your visit today.  ?Please pick up your prep supplies at the pharmacy within the next 1-3 days. ?If you use inhalers (even only as needed), please bring them with you on the day of your procedure. ? ? ?Thank you for choosing me and [facility]. ? ?Alonza Bogus PA  ? ?

## 2021-10-10 NOTE — Progress Notes (Signed)
Agree with assessment and plan as outlined.  

## 2021-11-09 ENCOUNTER — Other Ambulatory Visit: Payer: Self-pay | Admitting: Cardiology

## 2021-11-23 ENCOUNTER — Other Ambulatory Visit: Payer: Self-pay | Admitting: Cardiology

## 2021-12-04 ENCOUNTER — Encounter: Payer: Self-pay | Admitting: Gastroenterology

## 2021-12-04 ENCOUNTER — Ambulatory Visit (AMBULATORY_SURGERY_CENTER): Payer: Medicare Other | Admitting: Gastroenterology

## 2021-12-04 VITALS — BP 132/61 | HR 57 | Temp 97.1°F | Resp 25 | Ht 61.0 in | Wt 173.0 lb

## 2021-12-04 DIAGNOSIS — Z09 Encounter for follow-up examination after completed treatment for conditions other than malignant neoplasm: Secondary | ICD-10-CM

## 2021-12-04 DIAGNOSIS — R195 Other fecal abnormalities: Secondary | ICD-10-CM | POA: Diagnosis not present

## 2021-12-04 DIAGNOSIS — D125 Benign neoplasm of sigmoid colon: Secondary | ICD-10-CM

## 2021-12-04 DIAGNOSIS — Z86010 Personal history of colonic polyps: Secondary | ICD-10-CM | POA: Diagnosis not present

## 2021-12-04 DIAGNOSIS — D120 Benign neoplasm of cecum: Secondary | ICD-10-CM

## 2021-12-04 DIAGNOSIS — R1032 Left lower quadrant pain: Secondary | ICD-10-CM | POA: Diagnosis not present

## 2021-12-04 DIAGNOSIS — D122 Benign neoplasm of ascending colon: Secondary | ICD-10-CM

## 2021-12-04 DIAGNOSIS — D123 Benign neoplasm of transverse colon: Secondary | ICD-10-CM

## 2021-12-04 MED ORDER — SODIUM CHLORIDE 0.9 % IV SOLN: 500.0000 mL | Freq: Once | INTRAVENOUS | Status: AC

## 2021-12-04 NOTE — Progress Notes (Signed)
Called to room to assist during endoscopic procedure.  Patient ID and intended procedure confirmed with present staff. Received instructions for my participation in the procedure from the performing physician.  

## 2021-12-04 NOTE — Progress Notes (Signed)
[facility] History and Physical   Primary Care Physician:  Wenda Low, MD   Reason for Procedure:   Positive cologuard, history of colon polyps, LLQ pain  Plan:    colonoscopy     HPI: [name] is a 76 y.o. female  here for colonoscopy to evaluate positive cologuard. She has a history of polyps removed 04/2016, also mother had colon cancer. Patient denies any bowel symptoms at this time. Previously had some LLQ pain, treated empirically for diverticulitis which resolved her symptoms. Otherwise feels well without any cardiopulmonary symptoms.    Past Medical History:  Diagnosis Date   Arthritis    Breast CA ([place])    CAD (coronary artery disease), native coronary artery    Minimal calcified plaque in the distal LM with 0-24% stenosis with low Ca score by coronary CTA 07/2020   Chest pain    CKD (chronic kidney disease), stage III ([facility])    Depression    Diastolic dysfunction    Diverticulosis    Dyslipidemia    GERD (gastroesophageal reflux disease)    Heart murmur    heard one when she was pregnamt-maybe had echo-cannot remember   Hepatic cyst    Hyperlipidemia    Hyperplastic colon polyp    Hypertension    Incontinence of urine    Invasive ductal carcinoma of breast ([place]) 2013   Left   Iron deficiency anemia    Left rib fracture    Lumbar radiculopathy    Myocardial infarction ([place])    Obesity    Osteopenia    S/P radiation therapy  6-30 to 01-12-13                                                       6-30 to 01-12-13                                                         , Left Breast boost / 10 Gy in 5 fractions                     Spinal stenosis    Status post chemotherapy    chemotherapy consisting of [place] on 07/14/12 with 6 cycles planned. Every 3 week Herceptin starting 11/03/12.    Past Surgical History:  Procedure Laterality Date   ABDOMINAL ADHESION SURGERY  1980   APPENDECTOMY     Benign Breast Biopsy  1994   Left   BLADDER  SUSPENSION  10/2010   BREAST LUMPECTOMY Left 06/10/2012   BREAST LUMPECTOMY WITH NEEDLE LOCALIZATION AND AXILLARY SENTINEL LYMPH NODE BX  06/10/2012   Procedure: BREAST LUMPECTOMY WITH NEEDLE LOCALIZATION AND AXILLARY SENTINEL LYMPH NODE BX;  Surgeon: Rolm Bookbinder, MD;  Location: [facility];  Service: General;  Laterality: Left;   CHOLECYSTECTOMY     PORTACATH PLACEMENT  07/07/2012   Procedure: INSERTION PORT-A-CATH;  Surgeon: Rolm Bookbinder, MD;  Location: [facility];  Service: General;  Laterality: Left;  Port a cath insertion   TONSILLECTOMY AND ADENOIDECTOMY     age 46   [place]    Prior to Admission  medications   Medication Sig Start Date End Date Taking? Authorizing Provider  anastrozole (ARIMIDEX) 1 MG tablet Take 1 tablet (1 mg total) by mouth daily. 07/22/17  Yes Truitt Merle, MD  Ascorbic Acid (VITAMIN C) 1000 MG tablet Take 1,000 mg by mouth daily.   Yes [provider]  aspirin 81 MG tablet Take 81 mg by mouth daily.   Yes [provider]  atenolol (TENORMIN) 50 MG tablet Take 50 mg by mouth daily.   Yes [provider]  Biotin 10000 MCG TABS Take 1 tablet by mouth daily.   Yes [provider]  Calcium Carbonate (CALCIUM 600 PO) Take 2 capsules by mouth daily.   Yes [provider]  Cholecalciferol (VITAMIN D3) 10000 units TABS Take 1 tablet by mouth daily.   Yes [provider]  citalopram (CELEXA) 20 MG tablet Take 20 mg by mouth daily.   Yes [provider]  citalopram (CELEXA) 20 MG tablet Take 1 tablet by mouth daily.   Yes [provider]  ezetimibe (ZETIA) 10 MG tablet Take 1 tablet (10 mg total) by mouth daily. No further refills without follow up appointment scheduled [phone]. 3rd attempt. If no appt scheduled will need to send to PCP for refills. 11/25/21  Yes Turner, Eber Hong, MD  Ferrous Sulfate (IRON) 325 (65 Fe) MG TABS Take by mouth.   Yes [provider]  losartan-hydrochlorothiazide (HYZAAR) 50-12.5 MG tablet Take 1 tablet by mouth daily. 08/05/20  Yes [provider]  naproxen (NAPROSYN) 500 MG tablet Take 500 mg by mouth 2 (two) times daily as needed.   Yes [provider]  omeprazole (PRILOSEC) 40 MG capsule Take 40 mg by mouth daily. 08/04/20  Yes [provider]  simvastatin (ZOCOR) 80 MG tablet Take 40 mg by mouth at bedtime.  04/21/11  Yes [provider]  vitamin E 400 UNIT capsule Take 400 Units by mouth daily.   Yes [provider]  Zinc 50 MG TABS Take by mouth.   Yes [provider]  umeclidinium-vilanterol (ANORO ELLIPTA) 62.5-25 MCG/INH AEPB Inhale 1 puff into the lungs daily. 10/11/20   Rigoberto Noel, MD    Current Outpatient Medications  Medication Sig Dispense Refill   anastrozole (ARIMIDEX) 1 MG tablet Take 1 tablet (1 mg total) by mouth daily. 90 tablet 3   Ascorbic Acid (VITAMIN C) 1000 MG tablet Take 1,000 mg by mouth daily.     aspirin 81 MG tablet Take 81 mg by mouth daily.     atenolol (TENORMIN) 50 MG tablet Take 50 mg by mouth daily.     Biotin 10000 MCG TABS Take 1 tablet by mouth daily.     Calcium Carbonate (CALCIUM 600 PO) Take 2 capsules by mouth daily.     Cholecalciferol (VITAMIN D3) 10000 units TABS Take 1 tablet by mouth daily.     citalopram (CELEXA) 20 MG tablet Take 20 mg by mouth daily.     citalopram (CELEXA) 20 MG tablet Take 1 tablet by mouth daily.     ezetimibe (ZETIA) 10 MG tablet Take 1 tablet (10 mg total) by mouth daily. No further refills without follow up appointment scheduled [phone]. 3rd attempt. If no appt scheduled will need to send to PCP for refills. 15 tablet 0   Ferrous Sulfate (IRON) 325 (65 Fe) MG TABS Take by mouth.     losartan-hydrochlorothiazide (HYZAAR) 50-12.5 MG tablet Take 1 tablet by mouth daily.     naproxen (  NAPROSYN) 500 MG tablet Take 500 mg by mouth 2 (two) times daily as needed.     omeprazole  (PRILOSEC) 40 MG capsule Take 40 mg by mouth daily.     simvastatin (ZOCOR) 80 MG tablet Take 40 mg by mouth at bedtime.      vitamin E 400 UNIT capsule Take 400 Units by mouth daily.     Zinc 50 MG TABS Take by mouth.     umeclidinium-vilanterol (ANORO ELLIPTA) 62.5-25 MCG/INH AEPB Inhale 1 puff into the lungs daily. 60 each 0   Current Facility-Administered Medications  Medication Dose Route Frequency Provider Last Rate Last Admin   0.9 %  sodium chloride infusion  500 mL Intravenous Once Haris Baack, Carlota Raspberry, MD        Allergies as of 12/04/2021   (No Known Allergies)    Family History  Problem Relation Age of Onset   Colon cancer Mother 87   Prostate cancer Father 6   CAD Father    Breast cancer Sister 79   Stomach cancer Maternal Grandmother 23   Esophageal cancer Neg Hx    Rectal cancer Neg Hx     Social History   Socioeconomic History   Marital status: Single    Spouse name: Not on file   Number of children: Not on file   Years of education: Not on file   Highest education level: Not on file  Occupational History   Not on file  Tobacco Use   Smoking status: Never   Smokeless tobacco: Never  Substance and Sexual Activity   Alcohol use: Yes    Alcohol/week: 1.0 standard drink of alcohol    Types: 1 Glasses of wine per week    Comment: 1 glass of Wine per Week   Drug use: No   Sexual activity: Not Currently    Birth control/protection: Post-menopausal    Comment: Menarche age 73, parity age 30, G45, P49, Menopause age 38, No HRT  Other Topics Concern   Not on file  Social History Narrative   Not on file   Social Determinants of Health   Financial Resource Strain: Not on file  Food Insecurity: Not on file  Transportation Needs: Not on file  Physical Activity: Not on file  Stress: Not on file  Social Connections: Not on file  Intimate Partner Violence: Not on file    Review of Systems: All other review of systems negative except as mentioned in the  HPI.  Physical Exam: Vital signs BP (!) 151/61   Pulse 63   Temp (!) 97.1 F (36.2 C)   Ht '5\' 1"'$  (1.549 m)   Wt 173 lb (78.5 kg)   SpO2 96%   BMI 32.69 kg/m   General:   Alert,  Well-developed, pleasant and cooperative in NAD Lungs:  Clear throughout to auscultation.   Heart:  Regular rate and rhythm Abdomen:  Soft, nontender and nondistended.   Neuro/Psych:  Alert and cooperative. Normal mood and affect. A and O x 3  Jolly Mango, MD [facility]

## 2021-12-04 NOTE — Op Note (Signed)
[facility] Patient Name: [name] Procedure Date: 12/04/2021 11:26 AM MRN: [id number] Endoscopist: Remo Lipps P. Havery Moros , MD Age: 76 Referring MD:  Date of Birth: 01/13/1946 Gender: Female Account #: [id number] Procedure:                Colonoscopy Indications:              Positive Cologuard test - patient does have a                            history of polyps removed on last colonoscopy                            04/2016 and strong family history of colon cancer                            in mother dx age 21s. LLQ pain treated empirically                            for diveriticulitis in recent months with                            resolution of symptoms Medicines:                Monitored Anesthesia Care Procedure:                Pre-Anesthesia Assessment:                           - Prior to the procedure, a History and Physical                            was performed, and patient medications and                            allergies were reviewed. The patient's tolerance of                            previous anesthesia was also reviewed. The risks                            and benefits of the procedure and the sedation                            options and risks were discussed with the patient.                            All questions were answered, and informed consent                            was obtained. Prior Anticoagulants: The patient has                            taken no previous anticoagulant or antiplatelet  agents. ASA Grade Assessment: II - A patient with                            mild systemic disease. After reviewing the risks                            and benefits, the patient was deemed in                            satisfactory condition to undergo the procedure.                           After obtaining informed consent, the colonoscope                            was passed under direct vision. Throughout the                             procedure, the patient's blood pressure, pulse, and                            oxygen saturations were monitored continuously. The                            PCF-HQ190L Colonoscope was introduced through the                            anus and advanced to the the cecum, identified by                            appendiceal orifice and ileocecal valve. The                            colonoscopy was technically difficult and complex                            due to restricted mobility of the colon. The                            patient tolerated the procedure well. The quality                            of the bowel preparation was adequate. The                            ileocecal valve, appendiceal orifice, and rectum                            were photographed. Scope In: 11:37:05 AM Scope Out: 12:05:20 PM Scope Withdrawal Time: 0 hours 19 minutes 51 seconds  Total Procedure Duration: 0 hours 28 minutes 15 seconds  Findings:                 The perianal and digital rectal examinations were  normal.                           A diminutive polyp was found in the cecum. The                            polyp was sessile. The polyp was removed with a                            cold snare. Resection and retrieval were complete.                           A 3 mm polyp was found in the ascending colon. The                            polyp was sessile. The polyp was removed with a                            cold snare. Resection and retrieval were complete.                           Four flat and sessile polyps were found in the                            transverse colon. The polyps were 2 to 6 mm in                            size. These polyps were removed with a cold snare.                            Resection and retrieval were complete.                           A 5 mm polyp was found in the sigmoid colon. The                             polyp was sessile. The polyp was removed with a                            cold snare. Resection and retrieval were complete.                           Many small-mouthed diverticula were found in the                            left colon associated with restricted mobility of                            the left colon. Pediatric colonoscope used to                            complete the exam.  Internal hemorrhoids were found during                            retroflexion. The hemorrhoids were small.                           Residual seeds were noted in the cecum which took                            some time to clear / lavage to obtained adequate                            view. The exam was otherwise without abnormality. Complications:            No immediate complications. Estimated blood loss:                            Minimal. Estimated Blood Loss:     Estimated blood loss was minimal. Impression:               - One diminutive polyp in the cecum, removed with a                            cold snare. Resected and retrieved.                           - One 3 mm polyp in the ascending colon, removed                            with a cold snare. Resected and retrieved.                           - Four 2 to 6 mm polyps in the transverse colon,                            removed with a cold snare. Resected and retrieved.                           - One 5 mm polyp in the sigmoid colon, removed with                            a cold snare. Resected and retrieved.                           - Diverticulosis in the left colon with restricted                            mobility of the left colon                           - Internal hemorrhoids.                           - The examination was otherwise normal. Recommendation:           -  Patient has a contact number available for                            emergencies. The signs and symptoms of potential                             delayed complications were discussed with the                            patient. Return to normal activities tomorrow.                            Written discharge instructions were provided to the                            patient.                           - Resume previous diet.                           - Continue present medications.                           - Await pathology results.                           - No further cologuard testing given history of                            polyps and family history of colon cancer Elin Fenley P. Chenoa Luddy, MD 12/04/2021 12:13:15 PM This report has been signed electronically.

## 2021-12-04 NOTE — Progress Notes (Signed)
Pt's states no medical or surgical changes since previsit or office visit. 

## 2021-12-04 NOTE — Patient Instructions (Signed)
YOU HAD AN ENDOSCOPIC PROCEDURE TODAY AT [facility]:   Refer to the procedure report that was given to you for any specific questions about what was found during the examination.  If the procedure report does not answer your questions, please call your gastroenterologist to clarify.  If you requested that your care partner not be given the details of your procedure findings, then the procedure report has been included in a sealed envelope for you to review at your convenience later.  YOU SHOULD EXPECT: Some feelings of bloating in the abdomen. Passage of more gas than usual.  Walking can help get rid of the air that was put into your GI tract during the procedure and reduce the bloating. If you had a lower endoscopy (such as a colonoscopy or flexible sigmoidoscopy) you may notice spotting of blood in your stool or on the toilet paper. If you underwent a bowel prep for your procedure, you may not have a normal bowel movement for a few days.  Please Note:  You might notice some irritation and congestion in your nose or some drainage.  This is from the oxygen used during your procedure.  There is no need for concern and it should clear up in a day or so.  SYMPTOMS TO REPORT IMMEDIATELY:  Following lower endoscopy (colonoscopy or flexible sigmoidoscopy):  Excessive amounts of blood in the stool  Significant tenderness or worsening of abdominal pains  Swelling of the abdomen that is new, acute  Fever of 100F or higher   For urgent or emergent issues, a gastroenterologist can be reached at any hour by calling [phone]. Do not use MyChart messaging for urgent concerns.    DIET:  We do recommend a small meal at first, but then you may proceed to your regular diet.  Drink plenty of fluids but you should avoid alcoholic beverages for 24 hours.  MEDICATIONS:  Continue present medications.  Please see handouts given to you by your recovery nurse.  Thank you for allowing [place] to  provide for your healthcare needs today.  ACTIVITY:  You should plan to take it easy for the rest of today and you should NOT DRIVE or use heavy machinery until tomorrow (because of the sedation medicines used during the test).    FOLLOW UP: Our staff will call the number listed on your records the next business day following your procedure.  We will call around 7:15- 8:00 am to check on you and address any questions or concerns that you may have regarding the information given to you following your procedure. If we do not reach you, we will leave a message.  If you develop any symptoms (ie: fever, flu-like symptoms, shortness of breath, cough etc.) before then, please call [phone].  If you test positive for Covid 19 in the 2 weeks post procedure, please call and report this information to [place].    If any biopsies were taken you will be contacted by phone or by letter within the next 1-3 weeks.  Please call us at [phone] if you have not heard about the biopsies in 3 weeks.    SIGNATURES/CONFIDENTIALITY: You and/or your care partner have signed paperwork which will be entered into your electronic medical record.  These signatures attest to the fact that that the information above on your After Visit Summary has been reviewed and is understood.  Full responsibility of the confidentiality of this discharge information lies with you and/or your care-partner.

## 2021-12-04 NOTE — Progress Notes (Signed)
Sedate, gd SR, tolerated procedure well, VSS, report to RN 

## 2021-12-05 ENCOUNTER — Telehealth: Payer: Self-pay

## 2021-12-05 NOTE — Telephone Encounter (Signed)
  Follow up Call-     12/04/2021   10:03 AM  Call back number  Post procedure Call Back phone  # [phone]  Permission to leave phone message Yes     Patient questions:  Do you have a fever, pain , or abdominal swelling? No. Pain Score  0 *  Have you tolerated food without any problems? Yes.    Have you been able to return to your normal activities? Yes.    Do you have any questions about your discharge instructions: Diet   No. Medications  No. Follow up visit  No.  Do you have questions or concerns about your Care? No.  Actions: * If pain score is 4 or above: No action needed, pain <4.

## 2021-12-14 ENCOUNTER — Other Ambulatory Visit: Payer: Self-pay | Admitting: Cardiology

## 2021-12-21 ENCOUNTER — Other Ambulatory Visit: Payer: Self-pay | Admitting: Cardiology

## 2022-01-04 ENCOUNTER — Other Ambulatory Visit: Payer: Self-pay | Admitting: Cardiology

## 2022-01-06 NOTE — Telephone Encounter (Signed)
ERROR

## 2022-01-10 ENCOUNTER — Other Ambulatory Visit: Payer: Self-pay | Admitting: Cardiology

## 2022-01-18 ENCOUNTER — Other Ambulatory Visit: Payer: Self-pay | Admitting: Cardiology

## 2022-01-29 DIAGNOSIS — Z23 Encounter for immunization: Secondary | ICD-10-CM | POA: Diagnosis not present

## 2022-02-05 ENCOUNTER — Ambulatory Visit
Admission: RE | Admit: 2022-02-05 | Discharge: 2022-02-05 | Disposition: A | Discharge status: Home / Self Care | Payer: Medicare Other | Source: Ambulatory Visit | Attending: Internal Medicine | Admitting: Internal Medicine

## 2022-02-05 DIAGNOSIS — M8589 Other specified disorders of bone density and structure, multiple sites: Secondary | ICD-10-CM | POA: Diagnosis not present

## 2022-02-05 DIAGNOSIS — Z780 Asymptomatic menopausal state: Secondary | ICD-10-CM | POA: Diagnosis not present

## 2022-02-05 DIAGNOSIS — M8580 Other specified disorders of bone density and structure, unspecified site: Secondary | ICD-10-CM

## 2022-03-27 DIAGNOSIS — Z23 Encounter for immunization: Secondary | ICD-10-CM | POA: Diagnosis not present

## 2022-05-21 ENCOUNTER — Other Ambulatory Visit: Payer: Self-pay | Admitting: Internal Medicine

## 2022-05-21 DIAGNOSIS — R928 Other abnormal and inconclusive findings on diagnostic imaging of breast: Secondary | ICD-10-CM

## 2022-07-16 ENCOUNTER — Ambulatory Visit: Payer: Medicare Other

## 2022-08-06 DIAGNOSIS — F330 Major depressive disorder, recurrent, mild: Secondary | ICD-10-CM | POA: Diagnosis not present

## 2022-08-06 DIAGNOSIS — M5136 Other intervertebral disc degeneration, lumbar region: Secondary | ICD-10-CM | POA: Diagnosis not present

## 2022-08-06 DIAGNOSIS — K219 Gastro-esophageal reflux disease without esophagitis: Secondary | ICD-10-CM | POA: Diagnosis not present

## 2022-08-06 DIAGNOSIS — F411 Generalized anxiety disorder: Secondary | ICD-10-CM | POA: Diagnosis not present

## 2022-08-06 DIAGNOSIS — Z1331 Encounter for screening for depression: Secondary | ICD-10-CM | POA: Diagnosis not present

## 2022-08-06 DIAGNOSIS — Z0000 Encounter for general adult medical examination without abnormal findings: Secondary | ICD-10-CM | POA: Diagnosis not present

## 2022-08-06 DIAGNOSIS — J984 Other disorders of lung: Secondary | ICD-10-CM | POA: Diagnosis not present

## 2022-08-06 DIAGNOSIS — N1831 Chronic kidney disease, stage 3a: Secondary | ICD-10-CM | POA: Diagnosis not present

## 2022-08-06 DIAGNOSIS — R7303 Prediabetes: Secondary | ICD-10-CM | POA: Diagnosis not present

## 2022-08-06 DIAGNOSIS — I10 Essential (primary) hypertension: Secondary | ICD-10-CM | POA: Diagnosis not present

## 2022-08-06 DIAGNOSIS — E7800 Pure hypercholesterolemia, unspecified: Secondary | ICD-10-CM | POA: Diagnosis not present

## 2022-08-06 DIAGNOSIS — Z853 Personal history of malignant neoplasm of breast: Secondary | ICD-10-CM | POA: Diagnosis not present

## 2022-08-06 DIAGNOSIS — M8580 Other specified disorders of bone density and structure, unspecified site: Secondary | ICD-10-CM | POA: Diagnosis not present

## 2022-08-13 ENCOUNTER — Ambulatory Visit
Admission: RE | Admit: 2022-08-13 | Discharge: 2022-08-13 | Disposition: A | Discharge status: Home / Self Care | Payer: Medicare Other | Source: Ambulatory Visit | Attending: Internal Medicine | Admitting: Internal Medicine

## 2022-08-13 DIAGNOSIS — Z1231 Encounter for screening mammogram for malignant neoplasm of breast: Secondary | ICD-10-CM | POA: Diagnosis not present

## 2022-08-13 DIAGNOSIS — R928 Other abnormal and inconclusive findings on diagnostic imaging of breast: Secondary | ICD-10-CM

## 2022-08-18 ENCOUNTER — Other Ambulatory Visit: Payer: Self-pay | Admitting: Internal Medicine

## 2022-08-18 DIAGNOSIS — R928 Other abnormal and inconclusive findings on diagnostic imaging of breast: Secondary | ICD-10-CM

## 2022-08-23 ENCOUNTER — Other Ambulatory Visit: Payer: Self-pay | Admitting: Internal Medicine

## 2022-08-23 ENCOUNTER — Ambulatory Visit
Admission: RE | Admit: 2022-08-23 | Discharge: 2022-08-23 | Disposition: A | Discharge status: Home / Self Care | Payer: Medicare Other | Source: Ambulatory Visit | Attending: Internal Medicine | Admitting: Internal Medicine

## 2022-08-23 DIAGNOSIS — R921 Mammographic calcification found on diagnostic imaging of breast: Secondary | ICD-10-CM

## 2022-08-23 DIAGNOSIS — R928 Other abnormal and inconclusive findings on diagnostic imaging of breast: Secondary | ICD-10-CM

## 2022-09-01 ENCOUNTER — Other Ambulatory Visit: Payer: Self-pay | Admitting: Internal Medicine

## 2022-09-01 ENCOUNTER — Ambulatory Visit
Admission: RE | Admit: 2022-09-01 | Discharge: 2022-09-01 | Disposition: A | Discharge status: Home / Self Care | Payer: Medicare Other | Source: Ambulatory Visit | Attending: Internal Medicine | Admitting: Internal Medicine

## 2022-09-01 DIAGNOSIS — R1032 Left lower quadrant pain: Secondary | ICD-10-CM | POA: Diagnosis not present

## 2022-09-01 DIAGNOSIS — R0781 Pleurodynia: Secondary | ICD-10-CM

## 2022-09-01 DIAGNOSIS — I700 Atherosclerosis of aorta: Secondary | ICD-10-CM | POA: Diagnosis not present

## 2022-09-02 ENCOUNTER — Ambulatory Visit
Admission: RE | Admit: 2022-09-02 | Discharge: 2022-09-02 | Disposition: A | Discharge status: Home / Self Care | Payer: Medicare Other | Source: Ambulatory Visit | Attending: Internal Medicine | Admitting: Internal Medicine

## 2022-09-02 ENCOUNTER — Other Ambulatory Visit: Payer: Self-pay | Admitting: Internal Medicine

## 2022-09-02 DIAGNOSIS — R0781 Pleurodynia: Secondary | ICD-10-CM

## 2022-09-02 DIAGNOSIS — K5730 Diverticulosis of large intestine without perforation or abscess without bleeding: Secondary | ICD-10-CM | POA: Diagnosis not present

## 2022-09-02 DIAGNOSIS — Z853 Personal history of malignant neoplasm of breast: Secondary | ICD-10-CM | POA: Diagnosis not present

## 2022-09-02 DIAGNOSIS — M47814 Spondylosis without myelopathy or radiculopathy, thoracic region: Secondary | ICD-10-CM | POA: Diagnosis not present

## 2022-09-02 DIAGNOSIS — R1032 Left lower quadrant pain: Secondary | ICD-10-CM

## 2022-09-02 DIAGNOSIS — K7689 Other specified diseases of liver: Secondary | ICD-10-CM | POA: Diagnosis not present

## 2022-09-02 MED ORDER — IOPAMIDOL (ISOVUE-370) INJECTION 76%
80.0000 mL | Freq: Once | INTRAVENOUS | Status: AC | PRN
  Administered 2022-09-02: 80 mL via INTRAVENOUS

## 2023-02-11 DIAGNOSIS — K219 Gastro-esophageal reflux disease without esophagitis: Secondary | ICD-10-CM | POA: Diagnosis not present

## 2023-02-11 DIAGNOSIS — M5136 Other intervertebral disc degeneration, lumbar region: Secondary | ICD-10-CM | POA: Diagnosis not present

## 2023-02-11 DIAGNOSIS — R7303 Prediabetes: Secondary | ICD-10-CM | POA: Diagnosis not present

## 2023-02-11 DIAGNOSIS — I700 Atherosclerosis of aorta: Secondary | ICD-10-CM | POA: Diagnosis not present

## 2023-02-11 DIAGNOSIS — N1831 Chronic kidney disease, stage 3a: Secondary | ICD-10-CM | POA: Diagnosis not present

## 2023-02-11 DIAGNOSIS — I10 Essential (primary) hypertension: Secondary | ICD-10-CM | POA: Diagnosis not present

## 2023-02-11 DIAGNOSIS — F330 Major depressive disorder, recurrent, mild: Secondary | ICD-10-CM | POA: Diagnosis not present

## 2023-02-11 DIAGNOSIS — K5790 Diverticulosis of intestine, part unspecified, without perforation or abscess without bleeding: Secondary | ICD-10-CM | POA: Diagnosis not present

## 2023-02-11 DIAGNOSIS — Z853 Personal history of malignant neoplasm of breast: Secondary | ICD-10-CM | POA: Diagnosis not present

## 2023-02-11 DIAGNOSIS — R109 Unspecified abdominal pain: Secondary | ICD-10-CM | POA: Diagnosis not present

## 2023-02-16 DIAGNOSIS — Z23 Encounter for immunization: Secondary | ICD-10-CM | POA: Diagnosis not present

## 2023-02-25 ENCOUNTER — Other Ambulatory Visit: Payer: Self-pay | Admitting: Internal Medicine

## 2023-02-25 ENCOUNTER — Ambulatory Visit
Admission: RE | Admit: 2023-02-25 | Discharge: 2023-02-25 | Disposition: A | Discharge status: Home / Self Care | Payer: Medicare Other | Source: Ambulatory Visit | Attending: Internal Medicine | Admitting: Internal Medicine

## 2023-02-25 DIAGNOSIS — R921 Mammographic calcification found on diagnostic imaging of breast: Secondary | ICD-10-CM | POA: Diagnosis not present

## 2023-08-11 DIAGNOSIS — N1831 Chronic kidney disease, stage 3a: Secondary | ICD-10-CM | POA: Diagnosis not present

## 2023-08-11 DIAGNOSIS — I10 Essential (primary) hypertension: Secondary | ICD-10-CM | POA: Diagnosis not present

## 2023-08-11 DIAGNOSIS — I700 Atherosclerosis of aorta: Secondary | ICD-10-CM | POA: Diagnosis not present

## 2023-08-19 ENCOUNTER — Ambulatory Visit
Admission: RE | Admit: 2023-08-19 | Discharge: 2023-08-19 | Disposition: A | Discharge status: Home / Self Care | Payer: Medicare Other | Source: Ambulatory Visit | Attending: Internal Medicine | Admitting: Internal Medicine

## 2023-08-19 DIAGNOSIS — R921 Mammographic calcification found on diagnostic imaging of breast: Secondary | ICD-10-CM

## 2023-08-31 DIAGNOSIS — N1831 Chronic kidney disease, stage 3a: Secondary | ICD-10-CM | POA: Diagnosis not present

## 2023-08-31 DIAGNOSIS — Z853 Personal history of malignant neoplasm of breast: Secondary | ICD-10-CM | POA: Diagnosis not present

## 2023-08-31 DIAGNOSIS — E7800 Pure hypercholesterolemia, unspecified: Secondary | ICD-10-CM | POA: Diagnosis not present

## 2023-08-31 DIAGNOSIS — F411 Generalized anxiety disorder: Secondary | ICD-10-CM | POA: Diagnosis not present

## 2023-08-31 DIAGNOSIS — I10 Essential (primary) hypertension: Secondary | ICD-10-CM | POA: Diagnosis not present

## 2023-08-31 DIAGNOSIS — I700 Atherosclerosis of aorta: Secondary | ICD-10-CM | POA: Diagnosis not present

## 2023-09-09 DIAGNOSIS — N1831 Chronic kidney disease, stage 3a: Secondary | ICD-10-CM | POA: Diagnosis not present

## 2023-09-09 DIAGNOSIS — I700 Atherosclerosis of aorta: Secondary | ICD-10-CM | POA: Diagnosis not present

## 2023-09-09 DIAGNOSIS — I10 Essential (primary) hypertension: Secondary | ICD-10-CM | POA: Diagnosis not present

## 2023-09-21 ENCOUNTER — Ambulatory Visit (HOSPITAL_COMMUNITY)
Admission: RE | Admit: 2023-09-21 | Discharge: 2023-09-21 | Disposition: A | Discharge status: Home / Self Care | Source: Ambulatory Visit | Attending: Internal Medicine | Admitting: Internal Medicine

## 2023-09-21 ENCOUNTER — Other Ambulatory Visit (HOSPITAL_COMMUNITY): Payer: Self-pay | Admitting: Internal Medicine

## 2023-09-21 DIAGNOSIS — R519 Headache, unspecified: Secondary | ICD-10-CM

## 2023-09-21 DIAGNOSIS — N1831 Chronic kidney disease, stage 3a: Secondary | ICD-10-CM | POA: Diagnosis not present

## 2023-09-21 DIAGNOSIS — I6782 Cerebral ischemia: Secondary | ICD-10-CM | POA: Diagnosis not present

## 2023-09-21 DIAGNOSIS — F331 Major depressive disorder, recurrent, moderate: Secondary | ICD-10-CM | POA: Diagnosis not present

## 2023-09-21 DIAGNOSIS — I10 Essential (primary) hypertension: Secondary | ICD-10-CM | POA: Diagnosis not present

## 2023-09-21 MED ORDER — GADOBUTROL 1 MMOL/ML IV SOLN
7.0000 mL | Freq: Once | INTRAVENOUS | Status: AC | PRN
  Administered 2023-09-21: 7 mL via INTRAVENOUS

## 2023-09-30 DIAGNOSIS — F411 Generalized anxiety disorder: Secondary | ICD-10-CM | POA: Diagnosis not present

## 2023-09-30 DIAGNOSIS — N1831 Chronic kidney disease, stage 3a: Secondary | ICD-10-CM | POA: Diagnosis not present

## 2023-09-30 DIAGNOSIS — Z853 Personal history of malignant neoplasm of breast: Secondary | ICD-10-CM | POA: Diagnosis not present

## 2023-09-30 DIAGNOSIS — E7800 Pure hypercholesterolemia, unspecified: Secondary | ICD-10-CM | POA: Diagnosis not present

## 2023-10-09 DIAGNOSIS — I10 Essential (primary) hypertension: Secondary | ICD-10-CM | POA: Diagnosis not present

## 2023-10-09 DIAGNOSIS — I700 Atherosclerosis of aorta: Secondary | ICD-10-CM | POA: Diagnosis not present

## 2023-10-09 DIAGNOSIS — N1831 Chronic kidney disease, stage 3a: Secondary | ICD-10-CM | POA: Diagnosis not present

## 2023-10-13 DIAGNOSIS — D329 Benign neoplasm of meninges, unspecified: Secondary | ICD-10-CM | POA: Diagnosis not present

## 2023-10-13 DIAGNOSIS — M5481 Occipital neuralgia: Secondary | ICD-10-CM | POA: Diagnosis not present

## 2023-10-14 DIAGNOSIS — M5416 Radiculopathy, lumbar region: Secondary | ICD-10-CM | POA: Diagnosis not present

## 2023-10-14 DIAGNOSIS — N1831 Chronic kidney disease, stage 3a: Secondary | ICD-10-CM | POA: Diagnosis not present

## 2023-10-14 DIAGNOSIS — J984 Other disorders of lung: Secondary | ICD-10-CM | POA: Diagnosis not present

## 2023-10-14 DIAGNOSIS — Z853 Personal history of malignant neoplasm of breast: Secondary | ICD-10-CM | POA: Diagnosis not present

## 2023-10-14 DIAGNOSIS — F331 Major depressive disorder, recurrent, moderate: Secondary | ICD-10-CM | POA: Diagnosis not present

## 2023-10-14 DIAGNOSIS — F411 Generalized anxiety disorder: Secondary | ICD-10-CM | POA: Diagnosis not present

## 2023-10-14 DIAGNOSIS — Z23 Encounter for immunization: Secondary | ICD-10-CM | POA: Diagnosis not present

## 2023-10-14 DIAGNOSIS — D329 Benign neoplasm of meninges, unspecified: Secondary | ICD-10-CM | POA: Diagnosis not present

## 2023-10-14 DIAGNOSIS — I10 Essential (primary) hypertension: Secondary | ICD-10-CM | POA: Diagnosis not present

## 2023-10-14 DIAGNOSIS — Z0000 Encounter for general adult medical examination without abnormal findings: Secondary | ICD-10-CM | POA: Diagnosis not present

## 2023-10-14 DIAGNOSIS — R7303 Prediabetes: Secondary | ICD-10-CM | POA: Diagnosis not present

## 2023-10-14 DIAGNOSIS — M8580 Other specified disorders of bone density and structure, unspecified site: Secondary | ICD-10-CM | POA: Diagnosis not present

## 2023-10-14 DIAGNOSIS — E785 Hyperlipidemia, unspecified: Secondary | ICD-10-CM | POA: Diagnosis not present

## 2023-10-28 DIAGNOSIS — M5481 Occipital neuralgia: Secondary | ICD-10-CM | POA: Diagnosis not present

## 2023-10-28 DIAGNOSIS — M542 Cervicalgia: Secondary | ICD-10-CM | POA: Diagnosis not present

## 2023-10-28 DIAGNOSIS — Z6833 Body mass index (BMI) 33.0-33.9, adult: Secondary | ICD-10-CM | POA: Diagnosis not present

## 2023-10-31 DIAGNOSIS — F411 Generalized anxiety disorder: Secondary | ICD-10-CM | POA: Diagnosis not present

## 2023-10-31 DIAGNOSIS — N1831 Chronic kidney disease, stage 3a: Secondary | ICD-10-CM | POA: Diagnosis not present

## 2023-10-31 DIAGNOSIS — Z853 Personal history of malignant neoplasm of breast: Secondary | ICD-10-CM | POA: Diagnosis not present

## 2023-10-31 DIAGNOSIS — E7800 Pure hypercholesterolemia, unspecified: Secondary | ICD-10-CM | POA: Diagnosis not present

## 2023-10-31 DIAGNOSIS — I10 Essential (primary) hypertension: Secondary | ICD-10-CM | POA: Diagnosis not present

## 2023-10-31 DIAGNOSIS — I700 Atherosclerosis of aorta: Secondary | ICD-10-CM | POA: Diagnosis not present

## 2023-11-08 DIAGNOSIS — I10 Essential (primary) hypertension: Secondary | ICD-10-CM | POA: Diagnosis not present

## 2023-11-08 DIAGNOSIS — N1831 Chronic kidney disease, stage 3a: Secondary | ICD-10-CM | POA: Diagnosis not present

## 2023-11-08 DIAGNOSIS — I700 Atherosclerosis of aorta: Secondary | ICD-10-CM | POA: Diagnosis not present

## 2023-11-11 DIAGNOSIS — M47812 Spondylosis without myelopathy or radiculopathy, cervical region: Secondary | ICD-10-CM | POA: Diagnosis not present

## 2023-11-11 DIAGNOSIS — M542 Cervicalgia: Secondary | ICD-10-CM | POA: Diagnosis not present

## 2023-11-18 DIAGNOSIS — I10 Essential (primary) hypertension: Secondary | ICD-10-CM | POA: Diagnosis not present

## 2023-12-01 DIAGNOSIS — M47812 Spondylosis without myelopathy or radiculopathy, cervical region: Secondary | ICD-10-CM | POA: Diagnosis not present

## 2023-12-08 DIAGNOSIS — N1831 Chronic kidney disease, stage 3a: Secondary | ICD-10-CM | POA: Diagnosis not present

## 2023-12-08 DIAGNOSIS — I10 Essential (primary) hypertension: Secondary | ICD-10-CM | POA: Diagnosis not present

## 2023-12-08 DIAGNOSIS — I700 Atherosclerosis of aorta: Secondary | ICD-10-CM | POA: Diagnosis not present

## 2023-12-30 DIAGNOSIS — M47812 Spondylosis without myelopathy or radiculopathy, cervical region: Secondary | ICD-10-CM | POA: Diagnosis not present

## 2023-12-31 DIAGNOSIS — F411 Generalized anxiety disorder: Secondary | ICD-10-CM | POA: Diagnosis not present

## 2023-12-31 DIAGNOSIS — N1831 Chronic kidney disease, stage 3a: Secondary | ICD-10-CM | POA: Diagnosis not present

## 2023-12-31 DIAGNOSIS — I10 Essential (primary) hypertension: Secondary | ICD-10-CM | POA: Diagnosis not present

## 2023-12-31 DIAGNOSIS — Z853 Personal history of malignant neoplasm of breast: Secondary | ICD-10-CM | POA: Diagnosis not present

## 2023-12-31 DIAGNOSIS — E7800 Pure hypercholesterolemia, unspecified: Secondary | ICD-10-CM | POA: Diagnosis not present

## 2023-12-31 DIAGNOSIS — I700 Atherosclerosis of aorta: Secondary | ICD-10-CM | POA: Diagnosis not present

## 2024-01-07 DIAGNOSIS — N1831 Chronic kidney disease, stage 3a: Secondary | ICD-10-CM | POA: Diagnosis not present

## 2024-01-07 DIAGNOSIS — I700 Atherosclerosis of aorta: Secondary | ICD-10-CM | POA: Diagnosis not present

## 2024-01-07 DIAGNOSIS — I10 Essential (primary) hypertension: Secondary | ICD-10-CM | POA: Diagnosis not present

## 2024-02-06 DIAGNOSIS — I700 Atherosclerosis of aorta: Secondary | ICD-10-CM | POA: Diagnosis not present

## 2024-02-06 DIAGNOSIS — N1831 Chronic kidney disease, stage 3a: Secondary | ICD-10-CM | POA: Diagnosis not present

## 2024-02-06 DIAGNOSIS — I10 Essential (primary) hypertension: Secondary | ICD-10-CM | POA: Diagnosis not present

## 2024-02-08 DIAGNOSIS — U071 COVID-19: Secondary | ICD-10-CM | POA: Diagnosis not present

## 2024-03-01 DIAGNOSIS — E7800 Pure hypercholesterolemia, unspecified: Secondary | ICD-10-CM | POA: Diagnosis not present

## 2024-03-01 DIAGNOSIS — F411 Generalized anxiety disorder: Secondary | ICD-10-CM | POA: Diagnosis not present

## 2024-03-01 DIAGNOSIS — Z853 Personal history of malignant neoplasm of breast: Secondary | ICD-10-CM | POA: Diagnosis not present

## 2024-03-01 DIAGNOSIS — N1831 Chronic kidney disease, stage 3a: Secondary | ICD-10-CM | POA: Diagnosis not present

## 2024-03-04 DIAGNOSIS — M542 Cervicalgia: Secondary | ICD-10-CM | POA: Diagnosis not present

## 2024-03-04 DIAGNOSIS — M47812 Spondylosis without myelopathy or radiculopathy, cervical region: Secondary | ICD-10-CM | POA: Diagnosis not present
